# Patient Record
Sex: Male | Born: 1935 | Race: White | Hispanic: No | Marital: Married | Smoking: Former smoker
Health system: Southern US, Community
[De-identification: ages and names within clinical notes are randomized; demographics above are authoritative.]

## PROBLEM LIST (undated history)

## (undated) DIAGNOSIS — T7840XA Allergy, unspecified, initial encounter: Secondary | ICD-10-CM

## (undated) DIAGNOSIS — I5022 Chronic systolic (congestive) heart failure: Secondary | ICD-10-CM

## (undated) DIAGNOSIS — I10 Essential (primary) hypertension: Secondary | ICD-10-CM

## (undated) DIAGNOSIS — G7000 Myasthenia gravis without (acute) exacerbation: Secondary | ICD-10-CM

## (undated) DIAGNOSIS — Z860100 Personal history of colon polyps, unspecified: Secondary | ICD-10-CM

## (undated) DIAGNOSIS — Z8619 Personal history of other infectious and parasitic diseases: Secondary | ICD-10-CM

## (undated) DIAGNOSIS — F32A Depression, unspecified: Secondary | ICD-10-CM

## (undated) DIAGNOSIS — I480 Paroxysmal atrial fibrillation: Secondary | ICD-10-CM

## (undated) DIAGNOSIS — E785 Hyperlipidemia, unspecified: Secondary | ICD-10-CM

## (undated) DIAGNOSIS — I255 Ischemic cardiomyopathy: Secondary | ICD-10-CM

## (undated) DIAGNOSIS — R32 Unspecified urinary incontinence: Secondary | ICD-10-CM

## (undated) DIAGNOSIS — M1990 Unspecified osteoarthritis, unspecified site: Secondary | ICD-10-CM

## (undated) DIAGNOSIS — D649 Anemia, unspecified: Secondary | ICD-10-CM

## (undated) DIAGNOSIS — F329 Major depressive disorder, single episode, unspecified: Secondary | ICD-10-CM

## (undated) DIAGNOSIS — I2510 Atherosclerotic heart disease of native coronary artery without angina pectoris: Secondary | ICD-10-CM

## (undated) DIAGNOSIS — K219 Gastro-esophageal reflux disease without esophagitis: Secondary | ICD-10-CM

## (undated) HISTORY — DX: Personal history of other infectious and parasitic diseases: Z86.19

## (undated) HISTORY — DX: Chronic systolic (congestive) heart failure: I50.22

## (undated) HISTORY — DX: Gastro-esophageal reflux disease without esophagitis: K21.9

## (undated) HISTORY — DX: Depression, unspecified: F32.A

## (undated) HISTORY — DX: Essential (primary) hypertension: I10

## (undated) HISTORY — DX: Unspecified urinary incontinence: R32

## (undated) HISTORY — PX: CHOLECYSTECTOMY: SHX55

## (undated) HISTORY — DX: Unspecified osteoarthritis, unspecified site: M19.90

## (undated) HISTORY — DX: Personal history of colonic polyps: Z86.010

## (undated) HISTORY — DX: Myasthenia gravis without (acute) exacerbation: G70.00

## (undated) HISTORY — DX: Major depressive disorder, single episode, unspecified: F32.9

## (undated) HISTORY — PX: TONSILLECTOMY AND ADENOIDECTOMY: SUR1326

## (undated) HISTORY — DX: Personal history of colon polyps, unspecified: Z86.0100

## (undated) HISTORY — DX: Hyperlipidemia, unspecified: E78.5

## (undated) HISTORY — PX: CARDIAC CATHETERIZATION: SHX172

## (undated) HISTORY — DX: Allergy, unspecified, initial encounter: T78.40XA

## (undated) HISTORY — DX: Anemia, unspecified: D64.9

---
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
## 1988-08-07 DIAGNOSIS — J449 Chronic obstructive pulmonary disease, unspecified: Secondary | ICD-10-CM

## 1988-08-07 DIAGNOSIS — J4489 Other specified chronic obstructive pulmonary disease: Secondary | ICD-10-CM

## 1988-08-07 HISTORY — DX: Other specified chronic obstructive pulmonary disease: J44.89

## 1988-08-07 HISTORY — DX: Chronic obstructive pulmonary disease, unspecified: J44.9

## 1993-08-07 DIAGNOSIS — E785 Hyperlipidemia, unspecified: Secondary | ICD-10-CM

## 1993-08-07 HISTORY — DX: Hyperlipidemia, unspecified: E78.5

## 1995-03-03 ENCOUNTER — Encounter: Payer: Self-pay | Admitting: Family Medicine

## 1995-03-03 LAB — CONVERTED CEMR LAB: PSA: 2.1 ng/mL

## 1996-10-23 ENCOUNTER — Encounter: Payer: Self-pay | Admitting: Gastroenterology

## 1996-11-21 ENCOUNTER — Encounter: Payer: Self-pay | Admitting: Gastroenterology

## 1998-08-10 ENCOUNTER — Encounter: Payer: Self-pay | Admitting: Family Medicine

## 1998-08-10 LAB — CONVERTED CEMR LAB: PSA: 1 ng/mL

## 1999-01-07 ENCOUNTER — Inpatient Hospital Stay (HOSPITAL_COMMUNITY): Admission: EM | Admit: 1999-01-07 | Discharge: 1999-01-10 | Payer: Self-pay | Admitting: Emergency Medicine

## 1999-01-07 ENCOUNTER — Encounter: Payer: Self-pay | Admitting: Emergency Medicine

## 1999-01-10 ENCOUNTER — Encounter: Payer: Self-pay | Admitting: General Surgery

## 1999-03-16 ENCOUNTER — Ambulatory Visit (HOSPITAL_BASED_OUTPATIENT_CLINIC_OR_DEPARTMENT_OTHER): Admission: RE | Admit: 1999-03-16 | Discharge: 1999-03-16 | Payer: Self-pay | Admitting: Surgery

## 1999-08-24 ENCOUNTER — Encounter: Payer: Self-pay | Admitting: Family Medicine

## 2000-08-24 ENCOUNTER — Encounter: Payer: Self-pay | Admitting: Family Medicine

## 2000-08-24 LAB — CONVERTED CEMR LAB: PSA: 1 ng/mL

## 2001-10-25 ENCOUNTER — Encounter: Payer: Self-pay | Admitting: Family Medicine

## 2001-10-25 LAB — CONVERTED CEMR LAB: PSA: 0.9 ng/mL

## 2001-12-05 DIAGNOSIS — I10 Essential (primary) hypertension: Secondary | ICD-10-CM | POA: Insufficient documentation

## 2001-12-05 HISTORY — DX: Essential (primary) hypertension: I10

## 2003-03-02 ENCOUNTER — Encounter: Payer: Self-pay | Admitting: Emergency Medicine

## 2003-03-02 ENCOUNTER — Emergency Department (HOSPITAL_COMMUNITY): Admission: EM | Admit: 2003-03-02 | Discharge: 2003-03-02 | Payer: Self-pay | Admitting: Emergency Medicine

## 2003-03-06 ENCOUNTER — Encounter: Payer: Self-pay | Admitting: General Surgery

## 2003-03-10 ENCOUNTER — Inpatient Hospital Stay (HOSPITAL_COMMUNITY): Admission: RE | Admit: 2003-03-10 | Discharge: 2003-03-13 | Payer: Self-pay | Admitting: General Surgery

## 2003-03-10 ENCOUNTER — Encounter (INDEPENDENT_AMBULATORY_CARE_PROVIDER_SITE_OTHER): Payer: Self-pay | Admitting: Specialist

## 2004-07-26 ENCOUNTER — Ambulatory Visit: Payer: Self-pay | Admitting: Family Medicine

## 2004-07-28 ENCOUNTER — Ambulatory Visit: Payer: Self-pay | Admitting: Family Medicine

## 2005-01-24 ENCOUNTER — Ambulatory Visit: Payer: Self-pay | Admitting: Family Medicine

## 2005-04-03 ENCOUNTER — Ambulatory Visit: Payer: Self-pay | Admitting: Family Medicine

## 2006-04-27 ENCOUNTER — Ambulatory Visit: Payer: Self-pay | Admitting: Family Medicine

## 2006-05-07 ENCOUNTER — Encounter: Payer: Self-pay | Admitting: Family Medicine

## 2006-05-28 ENCOUNTER — Ambulatory Visit: Payer: Self-pay | Admitting: Family Medicine

## 2006-06-04 ENCOUNTER — Ambulatory Visit: Payer: Self-pay | Admitting: Family Medicine

## 2006-07-10 ENCOUNTER — Ambulatory Visit: Payer: Self-pay | Admitting: Family Medicine

## 2006-11-15 ENCOUNTER — Encounter: Payer: Self-pay | Admitting: Family Medicine

## 2006-11-15 DIAGNOSIS — Z8719 Personal history of other diseases of the digestive system: Secondary | ICD-10-CM

## 2006-11-15 DIAGNOSIS — N486 Induration penis plastica: Secondary | ICD-10-CM

## 2006-11-15 HISTORY — DX: Induration penis plastica: N48.6

## 2006-11-15 HISTORY — DX: Personal history of other diseases of the digestive system: Z87.19

## 2006-12-05 ENCOUNTER — Ambulatory Visit: Payer: Self-pay | Admitting: Family Medicine

## 2006-12-05 DIAGNOSIS — M25519 Pain in unspecified shoulder: Secondary | ICD-10-CM | POA: Insufficient documentation

## 2006-12-05 HISTORY — DX: Pain in unspecified shoulder: M25.519

## 2007-02-15 ENCOUNTER — Ambulatory Visit: Payer: Self-pay | Admitting: Family Medicine

## 2007-02-20 ENCOUNTER — Telehealth (INDEPENDENT_AMBULATORY_CARE_PROVIDER_SITE_OTHER): Payer: Self-pay | Admitting: *Deleted

## 2007-02-26 ENCOUNTER — Ambulatory Visit: Payer: Self-pay | Admitting: Family Medicine

## 2007-02-27 ENCOUNTER — Encounter: Payer: Self-pay | Admitting: Family Medicine

## 2007-03-06 ENCOUNTER — Ambulatory Visit: Payer: Self-pay | Admitting: Family Medicine

## 2007-03-13 ENCOUNTER — Encounter: Payer: Self-pay | Admitting: Family Medicine

## 2007-06-24 ENCOUNTER — Ambulatory Visit: Payer: Self-pay | Admitting: Family Medicine

## 2007-06-24 LAB — CONVERTED CEMR LAB
AST: 21 units/L (ref 0–37)
Albumin: 3.8 g/dL (ref 3.5–5.2)
Alkaline Phosphatase: 59 units/L (ref 39–117)
BUN: 13 mg/dL (ref 6–23)
Chloride: 103 meq/L (ref 96–112)
Cholesterol: 252 mg/dL (ref 0–200)
Creatinine, Ser: 0.8 mg/dL (ref 0.4–1.5)
Total Bilirubin: 0.7 mg/dL (ref 0.3–1.2)
VLDL: 26 mg/dL (ref 0–40)

## 2007-06-26 ENCOUNTER — Ambulatory Visit: Payer: Self-pay | Admitting: Family Medicine

## 2007-11-08 ENCOUNTER — Ambulatory Visit: Payer: Self-pay | Admitting: Family Medicine

## 2007-11-08 DIAGNOSIS — T7840XA Allergy, unspecified, initial encounter: Secondary | ICD-10-CM

## 2007-11-08 HISTORY — DX: Allergy, unspecified, initial encounter: T78.40XA

## 2008-04-06 ENCOUNTER — Telehealth: Payer: Self-pay | Admitting: Family Medicine

## 2008-10-14 ENCOUNTER — Ambulatory Visit: Payer: Self-pay | Admitting: Family Medicine

## 2008-10-14 DIAGNOSIS — L259 Unspecified contact dermatitis, unspecified cause: Secondary | ICD-10-CM

## 2008-10-14 DIAGNOSIS — K219 Gastro-esophageal reflux disease without esophagitis: Secondary | ICD-10-CM | POA: Insufficient documentation

## 2008-10-14 HISTORY — DX: Unspecified contact dermatitis, unspecified cause: L25.9

## 2008-10-14 HISTORY — DX: Gastro-esophageal reflux disease without esophagitis: K21.9

## 2009-04-10 ENCOUNTER — Observation Stay (HOSPITAL_COMMUNITY): Admission: EM | Admit: 2009-04-10 | Discharge: 2009-04-10 | Payer: Self-pay | Admitting: Emergency Medicine

## 2009-04-26 ENCOUNTER — Ambulatory Visit: Payer: Self-pay | Admitting: Family Medicine

## 2009-04-26 LAB — CONVERTED CEMR LAB
ALT: 22 units/L (ref 0–53)
Albumin: 3.5 g/dL (ref 3.5–5.2)
BUN: 15 mg/dL (ref 6–23)
Chloride: 106 meq/L (ref 96–112)
Cholesterol: 254 mg/dL — ABNORMAL HIGH (ref 0–200)
Eosinophils Relative: 4 % (ref 0.0–5.0)
Glucose, Bld: 99 mg/dL (ref 70–99)
HCT: 41.2 % (ref 39.0–52.0)
Lymphs Abs: 1.1 10*3/uL (ref 0.7–4.0)
MCV: 87.5 fL (ref 78.0–100.0)
Monocytes Absolute: 0.7 10*3/uL (ref 0.1–1.0)
Platelets: 155 10*3/uL (ref 150.0–400.0)
Potassium: 4.5 meq/L (ref 3.5–5.1)
RDW: 12.1 % (ref 11.5–14.6)
TSH: 1.5 microintl units/mL (ref 0.35–5.50)
Total Bilirubin: 0.9 mg/dL (ref 0.3–1.2)
VLDL: 26.4 mg/dL (ref 0.0–40.0)
WBC: 6.9 10*3/uL (ref 4.5–10.5)

## 2009-04-27 LAB — CONVERTED CEMR LAB: Vit D, 25-Hydroxy: 24 ng/mL — ABNORMAL LOW (ref 30–89)

## 2009-04-29 ENCOUNTER — Ambulatory Visit: Payer: Self-pay | Admitting: Family Medicine

## 2009-04-29 DIAGNOSIS — E559 Vitamin D deficiency, unspecified: Secondary | ICD-10-CM

## 2009-04-29 HISTORY — DX: Vitamin D deficiency, unspecified: E55.9

## 2009-05-13 ENCOUNTER — Ambulatory Visit: Payer: Self-pay | Admitting: Family Medicine

## 2009-05-13 LAB — CONVERTED CEMR LAB
OCCULT 1: POSITIVE
OCCULT 2: POSITIVE
OCCULT 3: NEGATIVE

## 2009-06-09 ENCOUNTER — Ambulatory Visit: Payer: Self-pay | Admitting: Gastroenterology

## 2009-06-09 ENCOUNTER — Encounter: Payer: Self-pay | Admitting: Gastroenterology

## 2009-06-16 ENCOUNTER — Encounter: Payer: Self-pay | Admitting: Gastroenterology

## 2009-06-16 ENCOUNTER — Ambulatory Visit: Payer: Self-pay | Admitting: Gastroenterology

## 2009-06-19 ENCOUNTER — Encounter: Payer: Self-pay | Admitting: Gastroenterology

## 2009-06-24 ENCOUNTER — Telehealth: Payer: Self-pay | Admitting: Family Medicine

## 2009-07-07 ENCOUNTER — Ambulatory Visit: Payer: Self-pay | Admitting: Family Medicine

## 2009-08-23 ENCOUNTER — Ambulatory Visit: Payer: Self-pay | Admitting: Family Medicine

## 2009-08-24 LAB — CONVERTED CEMR LAB: Vit D, 25-Hydroxy: 27 ng/mL — ABNORMAL LOW (ref 30–89)

## 2009-08-25 LAB — CONVERTED CEMR LAB: HDL: 42.2 mg/dL (ref >39.00)

## 2009-08-30 ENCOUNTER — Ambulatory Visit: Payer: Self-pay | Admitting: Family Medicine

## 2009-10-12 ENCOUNTER — Ambulatory Visit: Payer: Self-pay | Admitting: Family Medicine

## 2009-10-12 LAB — CONVERTED CEMR LAB
ALT: 24 units/L (ref 0–53)
AST: 27 units/L (ref 0–37)

## 2009-11-25 ENCOUNTER — Ambulatory Visit: Payer: Self-pay | Admitting: Family Medicine

## 2009-11-25 LAB — CONVERTED CEMR LAB
Cholesterol: 158 mg/dL (ref 0–200)
HDL: 40.4 mg/dL (ref >39.00)
Triglycerides: 72 mg/dL (ref 0.0–149.0)

## 2009-12-02 ENCOUNTER — Ambulatory Visit: Payer: Self-pay | Admitting: Family Medicine

## 2010-03-15 ENCOUNTER — Encounter (INDEPENDENT_AMBULATORY_CARE_PROVIDER_SITE_OTHER): Payer: Self-pay | Admitting: *Deleted

## 2010-07-06 ENCOUNTER — Ambulatory Visit: Payer: Self-pay | Admitting: Family Medicine

## 2010-09-06 NOTE — Assessment & Plan Note (Signed)
Summary: 3 MONTH FOLLOW UP/[facility]   Vital Signs:  Patient profile:   77 year old male Weight:      183.50 pounds Temp:     98.6 degrees F oral Pulse rate:   60 / minute Pulse rhythm:   regular BP sitting:   140 / 88  (left arm) Cuff size:   regular  Vitals Entered By: Sydell Axon LPN (December 02, 2009 10:32 AM) CC: 3 Month follow-up   History of Present Illness: Pt here for three month followup after increasing Vit D and starting Simva at 40mg . He feel well and has had no problems. He has gained weight lately, no exercise over the last intense winter. He has chronic anxiety. We discussed.  Problems Prior to Update: 1)  Blood in Stool 2/6 Cards  (ICD-578.1) 2)  Special Screening Malig Neoplasms Other Sites  (ICD-V76.49) 3)  Vitamin D Deficiency  (ICD-268.9) 4)  Gerd  (ICD-530.81) 5)  Eczema  (ICD-692.9) 6)  Allergy  (ICD-995.3) 7)  Special Screening Malignant Neoplasm of Prostate  (ICD-V76.44) 8)  Shoulder Pain, Left, Chronic  (ICD-719.41) 9)  Mononeuritis, Upper Limb Nec Scapular Nerve  (ICD-354.8) 10)  Rectal Bleeding, Hx of  (ICD-V12.79) 11)  Peyronie's Disease  (ICD-607.85) 12)  Anxiety  (ICD-300.00) 13)  Hypertension  (ICD-401.9) 14)  Hyperlipidemia  (ICD-272.4) 15)  COPD  (ICD-496)  Medications Prior to Update: 1)  Atenolol 25 Mg Tabs (Atenolol) .Marland Kitchen.. 1 By Mouth Every Night 2)  Nasonex 50 Mcg/act Susp (Mometasone Furoate) .Marland Kitchen.. 1 Spray Each Nostril As Needed 3)  Paxil 40 Mg Tabs (Paroxetine Hcl) .... One  Tab By Mouth Once Daily 4)  Ventolin Hfa 108 (90 Base) Mcg/act Aers (Albuterol Sulfate) .... One Spray Every 4 Hours As Needed 5)  Zyrtec Allergy 10 Mg Tabs (Cetirizine Hcl) .... Take One By Mouth Daily 6)  Protonix 40 Mg Tbec (Pantoprazole Sodium) .... Take One Tab Daily 7)  Simvastatin 40 Mg Tabs (Simvastatin) .... One Tab By Mouth At Night.  Allergies: 1)  ! Sulfa  Physical Exam  General:  Well-developed,well-nourished,in no acute distress; alert,appropriate and  cooperative throughout examination, comfortable and breathing easily through his nose. Head:  Normocephalic and atraumatic without obvious abnormalities. No apparent alopecia or balding. Mild thinning. Sinuses nontender. Eyes:  Conjunctiva clear bilaterally.  Ears:  External ear exam shows no significant lesions or deformities.  Otoscopic examination reveals clear canals, tympanic membranes are intact bilaterally without bulging, retraction, inflammation or discharge. Hearing is grossly normal bilaterally. Nose:  External nasal examination shows no deformity or inflammation. Nasal mucosa are pink and moist without lesions or exudates. Mouth:  Oral mucosa and oropharynx without lesions or exudates.  Teeth in reasonable repair. Neck:  No deformities, masses, or tenderness noted. Lungs:  Normal respiratory effort, chest expands symmetrically. Lungs are clear to auscultation, no crackles or wheezes. No wheezes heard today. Heart:  Normal rate and regular rhythm. S1 and S2 normal without gallop, murmur, click, rub or other extra sounds.   Impression & Recommendations:  Problem # 1:  HYPERLIPIDEMIA (ICD-272.4) Assessment Improved  Great improvement on the Simva...cont. His updated medication list for this problem includes:    Simvastatin 40 Mg Tabs (Simvastatin) ..... One tab by mouth at night.  Labs Reviewed: SGOT: 23 (11/25/2009)   SGPT: 27 (11/25/2009)   HDL:40.40 (11/25/2009), 42.20 (08/23/2009)  LDL:103 (11/25/2009), DEL (06/24/2007)  Chol:158 (11/25/2009), 252 (08/23/2009)  Trig:72.0 (11/25/2009), 109.0 (08/23/2009)  Problem # 2:  VITAMIN D DEFICIENCY (ICD-268.9) Assessment: Improved Should be  improved...will recheck next time. Has gone to two times a day.  Problem # 3:  ANXIETY (ICD-300.00) Assessment: Unchanged Discusssed increasing exercise and trying to avoid Ventolin to help anxiety as he gets jittery and his entire familty has had anxiety. He is on Paxil which he realizes is  helping. His updated medication list for this problem includes:    Paxil 40 Mg Tabs (Paroxetine hcl) ..... One  tab by mouth once daily  Problem # 4:  HYPERTENSION (ICD-401.9) Assessment: Improved Better but still elevatwed. Adjust next time if needed. His updated medication list for this problem includes:    Atenolol 25 Mg Tabs (Atenolol) .Marland Kitchen... 1 by mouth every night  BP today: 140/88 Prior BP: 150/90 (08/30/2009)  Labs Reviewed: K+: 4.5 (04/26/2009) Creat: : 0.8 (04/26/2009)   Chol: 158 (11/25/2009)   HDL: 40.40 (11/25/2009)   LDL: 103 (11/25/2009)   TG: 72.0 (11/25/2009)  Complete Medication List: 1)  Atenolol 25 Mg Tabs (Atenolol) .Marland Kitchen.. 1 by mouth every night 2)  Nasonex 50 Mcg/act Susp (Mometasone furoate) .Marland Kitchen.. 1 spray each nostril as needed 3)  Paxil 40 Mg Tabs (Paroxetine hcl) .... One  tab by mouth once daily 4)  Ventolin Hfa 108 (90 Base) Mcg/act Aers (Albuterol sulfate) .... One spray every 4 hours as needed 5)  Zyrtec Allergy 10 Mg Tabs (Cetirizine hcl) .... Take one by mouth daily 6)  Protonix 40 Mg Tbec (Pantoprazole sodium) .... Take one tab daily 7)  Simvastatin 40 Mg Tabs (Simvastatin) .... One tab by mouth at night.  Patient Instructions: 1)  Check Vit D next time. 2)  Start BP meds next time if BP still elevated....lose weight.  Current Allergies (reviewed today): ! SULFA

## 2010-09-06 NOTE — Assessment & Plan Note (Signed)
Summary: F/U/DLO   Vital Signs:  Patient profile:   77 year old male Height:      67 inches Weight:      182.75 pounds BMI:     28.73 Temp:     97.1 degrees F oral Pulse rate:   60 / minute Pulse rhythm:   regular BP sitting:   130 / 80  (right arm) Cuff size:   regular  Vitals Entered By: Linde Gillis CMA Duncan Dull) (July 06, 2010 9:54 AM) CC: follow-up visit   History of Present Illness: Pt here for followup. He is taking Vit D twice a day.  He has some burning in the urethral area. He eats lots of tomatoes and doesn't drink much water...knows he is probably dehydrated. We discussed hydration. He has trired to start watching his intake some...he is 1 pound lighter than last visit. He feels well with no acute complaints.  Problems Prior to Update: 1)  Blood in Stool 2/6 Cards  (ICD-578.1) 2)  Special Screening Malig Neoplasms Other Sites  (ICD-V76.49) 3)  Vitamin D Deficiency  (ICD-268.9) 4)  Gerd  (ICD-530.81) 5)  Eczema  (ICD-692.9) 6)  Allergy  (ICD-995.3) 7)  Special Screening Malignant Neoplasm of Prostate  (ICD-V76.44) 8)  Shoulder Pain, Left, Chronic  (ICD-719.41) 9)  Mononeuritis, Upper Limb Nec Scapular Nerve  (ICD-354.8) 10)  Rectal Bleeding, Hx of  (ICD-V12.79) 11)  Peyronie's Disease  (ICD-607.85) 12)  Anxiety  (ICD-300.00) 13)  Hypertension  (ICD-401.9) 14)  Hyperlipidemia  (ICD-272.4) 15)  COPD  (ICD-496)  Medications Prior to Update: 1)  Atenolol 25 Mg Tabs (Atenolol) .Marland Kitchen.. 1 By Mouth Every Night 2)  Nasonex 50 Mcg/act Susp (Mometasone Furoate) .Marland Kitchen.. 1 Spray Each Nostril As Needed 3)  Paxil 40 Mg Tabs (Paroxetine Hcl) .... One  Tab By Mouth Once Daily 4)  Ventolin Hfa 108 (90 Base) Mcg/act Aers (Albuterol Sulfate) .... One Spray Every 4 Hours As Needed 5)  Zyrtec Allergy 10 Mg Tabs (Cetirizine Hcl) .... Take One By Mouth Daily 6)  Protonix 40 Mg Tbec (Pantoprazole Sodium) .... Take One Tab Daily 7)  Simvastatin 40 Mg Tabs (Simvastatin) .... One Tab By  Mouth At Night.  Current Medications (verified): 1)  Atenolol 25 Mg Tabs (Atenolol) .Marland Kitchen.. 1 By Mouth Every Night 2)  Nasonex 50 Mcg/act Susp (Mometasone Furoate) .Marland Kitchen.. 1 Spray Each Nostril As Needed 3)  Paxil 40 Mg Tabs (Paroxetine Hcl) .... One  Tab By Mouth Once Daily 4)  Ventolin Hfa 108 (90 Base) Mcg/act Aers (Albuterol Sulfate) .... One Spray Every 4 Hours As Needed 5)  Zyrtec Allergy 10 Mg Tabs (Cetirizine Hcl) .... Take One By Mouth Daily 6)  Protonix 40 Mg Tbec (Pantoprazole Sodium) .... Take One Tab Daily 7)  Simvastatin 40 Mg Tabs (Simvastatin) .... One Tab By Mouth At Night. 8)  Fish Oil 1200 Mg Caps (Omega-3 Fatty Acids) .... Take One Tablet By Mouth Daily 9)  Vitamin D 2000 Unit Caps (Cholecalciferol) .... Take One Tablet By Mouth Daily  Allergies: 1)  ! Sulfa  Physical Exam  General:  Well-developed,well-nourished,in no acute distress; alert,appropriate and cooperative throughout examination, comfortable and breathing easily through his nose. Head:  Normocephalic and atraumatic without obvious abnormalities. No apparent alopecia or balding. Mild thinning. Sinuses nontender. Eyes:  Conjunctiva clear bilaterally.  Ears:  External ear exam shows no significant lesions or deformities.  Otoscopic examination reveals clear canals, tympanic membranes are intact bilaterally without bulging, retraction, inflammation or discharge. Hearing is grossly normal bilaterally.  Nose:  External nasal examination shows no deformity or inflammation. Nasal mucosa are pink and moist without lesions or exudates. Mouth:  Oral mucosa and oropharynx without lesions or exudates.  Teeth in reasonable repair. Neck:  No deformities, masses, or tenderness noted. Chest Wall:  No deformities, masses, tenderness or gynecomastia noted. Lungs:  Normal respiratory effort, chest expands symmetrically. Lungs are clear to auscultation, no crackles or wheezes. No wheezes heard today. Heart:  Normal rate and regular  rhythm. S1 and S2 normal without gallop, murmur, click, rub or other extra sounds.   Impression & Recommendations:  Problem # 1:  VITAMIN D DEFICIENCY (ICD-268.9) Assessment Unchanged Is taking Vit D OTC two times a day. Will recheck at Comp Exam...to be scheduled for next visit.  Problem # 2:  HYPERTENSION (ICD-401.9) Assessment: Improved Acceptable today altho weight loss will help improve even more. His updated medication list for this problem includes:    Atenolol 25 Mg Tabs (Atenolol) .Marland Kitchen... 1 by mouth every night  BP today: 130/80 Prior BP: 140/88 (12/02/2009)  Labs Reviewed: K+: 4.5 (04/26/2009) Creat: : 0.8 (04/26/2009)   Chol: 158 (11/25/2009)   HDL: 40.40 (11/25/2009)   LDL: 103 (11/25/2009)   TG: 72.0 (11/25/2009)  Complete Medication List: 1)  Atenolol 25 Mg Tabs (Atenolol) .Marland Kitchen.. 1 by mouth every night 2)  Nasonex 50 Mcg/act Susp (Mometasone furoate) .Marland Kitchen.. 1 spray each nostril as needed 3)  Paxil 40 Mg Tabs (Paroxetine hcl) .... One  tab by mouth once daily 4)  Ventolin Hfa 108 (90 Base) Mcg/act Aers (Albuterol sulfate) .... One spray every 4 hours as needed 5)  Zyrtec Allergy 10 Mg Tabs (Cetirizine hcl) .... Take one by mouth daily 6)  Protonix 40 Mg Tbec (Pantoprazole sodium) .... Take one tab daily 7)  Simvastatin 40 Mg Tabs (Simvastatin) .... One tab by mouth at night. 8)  Fish Oil 1200 Mg Caps (Omega-3 fatty acids) .... Take one tablet by mouth daily 9)  Vitamin D 2000 Unit Caps (Cholecalciferol) .... Take one tablet by mouth daily  Patient Instructions: 1)  RTC for Comp Exam, labs prior.    Orders Added: 1)  Est. Patient Level III [40102]    Current Allergies (reviewed today): ! SULFA

## 2010-09-06 NOTE — Letter (Signed)
Summary: Nadara Eaton letter  [place] at [facility]  [address] [place], [place] [address]   Phone: [phone]  Fax: [phone]       03/15/2010 MRN: [id number]  Norah Harwick [address] [place], [place]  [address]  Dear Mr. NAJI,  [facility], and [facility] announce the retirement of Arta Silence, M.D., from full-time practice at the [facility] office effective February 03, 2010 and his plans of returning part-time.  It is important to Dr. Hetty Ely and to our practice that you understand that [facility] has seven physicians in our office for your health care needs.  We will continue to offer the same exceptional care that you have today.    Dr. Hetty Ely has spoken to many of you about his plans for retirement and returning part-time in the fall.   We will continue to work with you through the transition to schedule appointments for you in the office and meet the high standards that [facility] is committed to.   Again, it is with great pleasure that we share the news that Dr. Hetty Ely will return to [facility] at [facility] in October of 2011 with a reduced schedule.    If you have any questions, or would like to request an appointment with one of our physicians, please call us at [phone] and press the option for Scheduling an appointment.  We take pleasure in providing you with excellent patient care and look forward to seeing you at your next office visit.  Our [facility] Physicians are:  Tillman Abide, M.D. Laurita Quint, M.D. Roxy Manns, M.D. Kerby Nora, M.D. Hannah Beat, M.D. Ruthe Mannan, M.D. We proudly welcomed Raechel Ache, M.D. and Eustaquio Boyden, M.D. to the practice in July/August 2011.  Sincerely,   [facility]

## 2010-09-06 NOTE — Assessment & Plan Note (Signed)
Summary: 4 month follow up/rbh   Vital Signs:  Patient profile:   77 year old male Weight:      185 pounds Temp:     97.9 degrees F oral Pulse rate:   64 / minute Pulse rhythm:   regular BP sitting:   150 / 90  (left arm) Cuff size:   regular  Vitals Entered By: Sydell Axon LPN (August 30, 2009 9:55 AM) CC: 4 Month follow-up   History of Present Illness: Pt here for recheck of chol and Vit D. He has been a couch potatoe wth the cold weather. He is unaware that he gained 8 pounds since last visit but is not surprised.  He is taking 600 Iu of Vit D two times a day. He feels sluggish.  Problems Prior to Update: 1)  Blood in Stool 2/6 Cards  (ICD-578.1) 2)  Special Screening Malig Neoplasms Other Sites  (ICD-V76.49) 3)  Vitamin D Deficiency  (ICD-268.9) 4)  Gerd  (ICD-530.81) 5)  Eczema  (ICD-692.9) 6)  Allergy  (ICD-995.3) 7)  Special Screening Malignant Neoplasm of Prostate  (ICD-V76.44) 8)  Shoulder Pain, Left, Chronic  (ICD-719.41) 9)  Mononeuritis, Upper Limb Nec Scapular Nerve  (ICD-354.8) 10)  Rectal Bleeding, Hx of  (ICD-V12.79) 11)  Peyronie's Disease  (ICD-607.85) 12)  Anxiety  (ICD-300.00) 13)  Hypertension  (ICD-401.9) 14)  Hyperlipidemia  (ICD-272.4) 15)  COPD  (ICD-496)  Medications Prior to Update: 1)  Atenolol 25 Mg Tabs (Atenolol) .Marland Kitchen.. 1 By Mouth Every Night 2)  Nasonex 50 Mcg/act Susp (Mometasone Furoate) .Marland Kitchen.. 1 Spray Each Nostril Prn 3)  Paxil 40 Mg Tabs (Paroxetine Hcl) .... One  Tab By Mouth Once Daily 4)  Ventolin Hfa 108 (90 Base) Mcg/act Aers (Albuterol Sulfate) .... One Spray Every 4 Hours As Needed 5)  Zyrtec Allergy 10 Mg Tabs (Cetirizine Hcl) .... Take One By Mouth Daily 6)  Ranitidine Hcl 150 Mg Caps (Ranitidine Hcl) .... One Cap By Mouth Two Times A Day 7)  Protonix 40 Mg Tbec (Pantoprazole Sodium) .... Take One Tab Daily  Allergies: 1)  ! Sulfa  Physical Exam  General:  Well-developed,well-nourished,in no acute distress;  alert,appropriate and cooperative throughout examination, comfortable and breathing easily through his nose. Head:  Normocephalic and atraumatic without obvious abnormalities. No apparent alopecia or balding. Mild thinning. Sinuses nontender. Eyes:  Conjunctiva clear bilaterally.  Ears:  External ear exam shows no significant lesions or deformities.  Otoscopic examination reveals clear canals, tympanic membranes are intact bilaterally without bulging, retraction, inflammation or discharge. Hearing is grossly normal bilaterally. Nose:  External nasal examination shows no deformity or inflammation. Nasal mucosa are pink and moist without lesions or exudates. Mouth:  Oral mucosa and oropharynx without lesions or exudates.  Teeth in reasonable repair.   Impression & Recommendations:  Problem # 1:  VITAMIN D DEFICIENCY (ICD-268.9) Assessment Improved But still low. Increase to 1000 Iu two times a day   Problem # 2:  HYPERLIPIDEMIA (ICD-272.4) Assessment: Unchanged Slightly worse. Is willing to try meds. He suggests Simvastatin. Will try 40mg . Should get to acceptable level. His updated medication list for this problem includes:    Simvastatin 40 Mg Tabs (Simvastatin) ..... One tab by mouth at night.  Labs Reviewed: SGOT: 19 (04/26/2009)   SGPT: 22 (04/26/2009)   HDL:42.20 (08/23/2009), 40.70 (04/26/2009)  LDL:DEL (06/24/2007)  Chol:252 (08/23/2009), 254 (04/26/2009)  Trig:109.0 (08/23/2009), 132.0 (04/26/2009)  Problem # 3:  HYPERTENSION (ICD-401.9) Assessment: Deteriorated Presumavbly incr due to weight gain. Will try  to decrease via diet and exercise. Adjust meds next time as needed. His updated medication list for this problem includes:    Atenolol 25 Mg Tabs (Atenolol) .Marland Kitchen... 1 by mouth every night  BP today: 150/90 Prior BP: 124/72 (06/09/2009)  Labs Reviewed: K+: 4.5 (04/26/2009) Creat: : 0.8 (04/26/2009)   Chol: 252 (08/23/2009)   HDL: 42.20 (08/23/2009)   LDL: DEL (06/24/2007)    TG: 109.0 (08/23/2009)  Complete Medication List: 1)  Atenolol 25 Mg Tabs (Atenolol) .Marland Kitchen.. 1 by mouth every night 2)  Nasonex 50 Mcg/act Susp (Mometasone furoate) .Marland Kitchen.. 1 spray each nostril as needed 3)  Paxil 40 Mg Tabs (Paroxetine hcl) .... One  tab by mouth once daily 4)  Ventolin Hfa 108 (90 Base) Mcg/act Aers (Albuterol sulfate) .... One spray every 4 hours as needed 5)  Zyrtec Allergy 10 Mg Tabs (Cetirizine hcl) .... Take one by mouth daily 6)  Protonix 40 Mg Tbec (Pantoprazole sodium) .... Take one tab daily 7)  Simvastatin 40 Mg Tabs (Simvastatin) .... One tab by mouth at night.  Patient Instructions: 1)  SGOT, SGPT 272. 4   6 WEEKS 2)  See me in 3 mos, SGOT, SGPT, CHOL PROFILE  272. 4    prior 3)  Lose weight via diet and exercise and recheck BP next visit. Prescriptions: SIMVASTATIN 40 MG TABS (SIMVASTATIN) one tab by mouth at night.  #30 x 12   Entered and Authorized by:   Shaune Leeks MD   Signed by:   Shaune Leeks MD on 08/30/2009   Method used:   Electronically to        [facility] [id number] * (retail)       [address]       [place], [place]  [address]       Ph: [phone] or [phone]       Fax: [phone]   RxID:   [id number]   Current Allergies (reviewed today): ! SULFA

## 2010-10-10 ENCOUNTER — Other Ambulatory Visit: Payer: Self-pay | Admitting: Family Medicine

## 2010-10-10 ENCOUNTER — Encounter: Payer: Self-pay | Admitting: Family Medicine

## 2010-10-10 ENCOUNTER — Encounter (INDEPENDENT_AMBULATORY_CARE_PROVIDER_SITE_OTHER): Payer: Self-pay | Admitting: *Deleted

## 2010-10-10 ENCOUNTER — Other Ambulatory Visit (INDEPENDENT_AMBULATORY_CARE_PROVIDER_SITE_OTHER): Payer: Medicare Other

## 2010-10-10 DIAGNOSIS — I10 Essential (primary) hypertension: Secondary | ICD-10-CM

## 2010-10-10 DIAGNOSIS — E559 Vitamin D deficiency, unspecified: Secondary | ICD-10-CM

## 2010-10-10 DIAGNOSIS — E785 Hyperlipidemia, unspecified: Secondary | ICD-10-CM

## 2010-10-10 DIAGNOSIS — Z125 Encounter for screening for malignant neoplasm of prostate: Secondary | ICD-10-CM

## 2010-10-10 LAB — HEPATIC FUNCTION PANEL
Alkaline Phosphatase: 50 U/L (ref 39–117)
Bilirubin, Direct: 0.1 mg/dL (ref 0.0–0.3)
Total Bilirubin: 0.4 mg/dL (ref 0.3–1.2)
Total Protein: 7.1 g/dL (ref 6.0–8.3)

## 2010-10-10 LAB — CBC WITH DIFFERENTIAL/PLATELET
Basophils Relative: 0.6 % (ref 0.0–3.0)
Eosinophils Absolute: 0.2 10*3/uL (ref 0.0–0.7)
Eosinophils Relative: 2.3 % (ref 0.0–5.0)
Lymphocytes Relative: 19.2 % (ref 12.0–46.0)
Monocytes Absolute: 0.6 10*3/uL (ref 0.1–1.0)
Neutrophils Relative %: 68.6 % (ref 43.0–77.0)
Platelets: 157 10*3/uL (ref 150.0–400.0)
RBC: 4.36 Mil/uL (ref 4.22–5.81)
WBC: 6.6 10*3/uL (ref 4.5–10.5)

## 2010-10-10 LAB — BASIC METABOLIC PANEL
BUN: 14 mg/dL (ref 6–23)
Calcium: 8.9 mg/dL (ref 8.4–10.5)
Creatinine, Ser: 0.7 mg/dL (ref 0.4–1.5)

## 2010-10-10 LAB — LIPID PANEL
Cholesterol: 169 mg/dL (ref 0–200)
HDL: 45.7 mg/dL (ref >39.00)
LDL Cholesterol: 105 mg/dL — ABNORMAL HIGH (ref 0–99)
Total CHOL/HDL Ratio: 4
Triglycerides: 93 mg/dL (ref 0.0–149.0)
VLDL: 18.6 mg/dL (ref 0.0–40.0)

## 2010-10-10 LAB — PSA: PSA: 0.76 ng/mL (ref 0.10–4.00)

## 2010-10-11 LAB — CONVERTED CEMR LAB: Vit D, 25-Hydroxy: 40 ng/mL (ref 30–89)

## 2010-10-13 ENCOUNTER — Encounter: Payer: Self-pay | Admitting: Family Medicine

## 2010-10-13 ENCOUNTER — Encounter (INDEPENDENT_AMBULATORY_CARE_PROVIDER_SITE_OTHER): Payer: BC Managed Care – HMO | Admitting: Family Medicine

## 2010-10-13 DIAGNOSIS — Z23 Encounter for immunization: Secondary | ICD-10-CM

## 2010-10-13 DIAGNOSIS — Z0100 Encounter for examination of eyes and vision without abnormal findings: Secondary | ICD-10-CM

## 2010-10-13 DIAGNOSIS — Z0110 Encounter for examination of ears and hearing without abnormal findings: Secondary | ICD-10-CM

## 2010-10-13 DIAGNOSIS — Z0000 Encounter for general adult medical examination without abnormal findings: Secondary | ICD-10-CM

## 2010-10-18 NOTE — Assessment & Plan Note (Signed)
Summary: MEDICARE WELLNESS CPX/RBH   Vital Signs:  Patient profile:   77 year old male Weight:      186.75 pounds Temp:     98.0 degrees F oral Pulse rate:   64 / minute Pulse rhythm:   regular BP sitting:   140 / 78  (left arm) Cuff size:   large  Vitals Entered By: Sydell Axon LPN (October 12, 452 3:25 PM) CC: 30 minute checkup, had a colonoscopy 11/10  Vision Screening:Left eye with correction: 20 / 30 Right eye with correction: 20 / 40 Both eyes with correction: 20 / 30        Vision Entered By: Sydell Axon LPN (October 13, 979 3:26 PM) 25db HL: Left  500 hz: 25db 1000 hz: 25db 2000 hz: 25db 4000 hz: No Response Right  500 hz: 25db 1000 hz: 25db 2000 hz: 25db 4000 hz: No Response    History of Present Illness: Pt here for Comp Exam. He feels well with no complaints except his weight is up some. He has lost before. He admits to more sweets than previously.   Preventive Screening-Counseling & Management  Alcohol-Tobacco     Alcohol drinks/day: <1     Alcohol type: beer     Smoking Status: quit     Packs/Day: 08/1998 44pyh     Year Quit: 1970     Pack years: 40     Passive Smoke Exposure: no  Caffeine-Diet-Exercise     Caffeine use/day: 1     Does Patient Exercise: yes     Type of exercise: works around the house     Times/week: 6  Problems Prior to Update: 1)  Health Maintenance Exam  (ICD-V70.0) 2)  Blood in Stool 2/6 Cards  (ICD-578.1) 3)  Special Screening Malig Neoplasms Other Sites  (ICD-V76.49) 4)  Vitamin D Deficiency  (ICD-268.9) 5)  Gerd  (ICD-530.81) 6)  Eczema  (ICD-692.9) 7)  Allergy  (ICD-995.3) 8)  Special Screening Malignant Neoplasm of Prostate  (ICD-V76.44) 9)  Shoulder Pain, Left, Chronic  (ICD-719.41) 10)  Mononeuritis, Upper Limb Nec Scapular Nerve  (ICD-354.8) 11)  Rectal Bleeding, Hx of  (ICD-V12.79) 12)  Peyronie's Disease  (ICD-607.85) 13)  Anxiety  (ICD-300.00) 14)  Hypertension  (ICD-401.9) 15)  Hyperlipidemia   (ICD-272.4) 16)  COPD  (ICD-496)  Medications Prior to Update: 1)  Atenolol 25 Mg Tabs (Atenolol) .Marland Kitchen.. 1 By Mouth Every Night 2)  Nasonex 50 Mcg/act Susp (Mometasone Furoate) .Marland Kitchen.. 1 Spray Each Nostril As Needed 3)  Paxil 40 Mg Tabs (Paroxetine Hcl) .... One  Tab By Mouth Once Daily 4)  Ventolin Hfa 108 (90 Base) Mcg/act Aers (Albuterol Sulfate) .... One Spray Every 4 Hours As Needed 5)  Zyrtec Allergy 10 Mg Tabs (Cetirizine Hcl) .... Take One By Mouth Daily 6)  Protonix 40 Mg Tbec (Pantoprazole Sodium) .... Take One Tab Daily 7)  Simvastatin 40 Mg Tabs (Simvastatin) .... One Tab By Mouth At Night. 8)  Fish Oil 1200 Mg Caps (Omega-3 Fatty Acids) .... Take One Tablet By Mouth Daily 9)  Vitamin D 2000 Unit Caps (Cholecalciferol) .... Take One Tablet By Mouth Daily  Current Medications (verified): 1)  Atenolol 25 Mg Tabs (Atenolol) .Marland Kitchen.. 1 By Mouth Every Night 2)  Nasonex 50 Mcg/act Susp (Mometasone Furoate) .Marland Kitchen.. 1 Spray Each Nostril As Needed 3)  Paxil 40 Mg Tabs (Paroxetine Hcl) .... One  Tab By Mouth Once Daily 4)  Ventolin Hfa 108 (90 Base) Mcg/act Aers (Albuterol Sulfate) .Marland KitchenMarland KitchenMarland Kitchen  One Spray Every 4 Hours As Needed 5)  Zyrtec Allergy 10 Mg Tabs (Cetirizine Hcl) .... Take One By Mouth Daily 6)  Protonix 40 Mg Tbec (Pantoprazole Sodium) .... Take One Tab Daily 7)  Simvastatin 40 Mg Tabs (Simvastatin) .... One Tab By Mouth At Night. 8)  Fish Oil 1200 Mg Caps (Omega-3 Fatty Acids) .... Take One Tablet By Mouth Daily 9)  Vitamin D 2000 Unit Caps (Cholecalciferol) .... Take One Tablet By Mouth Daily  Allergies: 1)  ! Sulfa  Past History:  Past Medical History: Last updated: 06/08/2009 Cholelithiasis: 1990 COPD1990 Hyperlipidemia: PRE 1996 Hypertension: 12/2001 Anxiety: 16 YOA(SUPPORT GROUP) internal Hemorrhoids 1998  Past Surgical History: Last updated: 06/21/2009 COLONOSCOPY NML EXCEPT INT. HEMMS. 11/21/1996 GALLSTONES:02/05/2003 CHOLECYSTECTOMY 03/13/2003 ([facility])  EGD DILATION  OF STRICTURE AT [facility] (DR KAPLAN) 06/09/2009 COLONOSCOPY POLYP X 2 B9  INT HEMMS   PROCTITIS (DR KAPLAN) 06/16/2009     10 YRS  Family History: Last updated: 10/13/2010 Father dec 90 CHF Mother dec 87 Natural Brother A 25 Prostatitis Brother dec 82 Post op of Aneurysm Brother dec 89 Natural causes   Dementia Brother dec 73 Lung Ca (smoker) Brother dec 62 ETOHic Cirrhosis Sister A  Sister A Arthritis Sister dec 53 Melanoma  Social History: Last updated: 12/05/2006 Occupation:Maintenance  Retired Married  lives w/ wife   3 children Former Smoker quit 1970 40 pyh Alcohol use-yes Drug use-no  Risk Factors: Alcohol Use: <1 (10/13/2010) Caffeine Use: 1 (10/13/2010) Exercise: yes (10/13/2010)  Risk Factors: Smoking Status: quit (10/13/2010) Packs/Day: 08/1998 44pyh (10/13/2010) Passive Smoke Exposure: no (10/13/2010)  Family History: Father dec 90 CHF Mother dec 87 Natural Brother A 33 Prostatitis Brother dec 82 Post op of Aneurysm Brother dec 89 Natural causes   Dementia Brother dec 73 Lung Ca (smoker) Brother dec 62 ETOHic Cirrhosis Sister A  Sister A Arthritis Sister dec 53 Melanoma  Review of Systems General:  Complains of fatigue and weakness; denies chills, fever, sweats, and weight loss; poss from Paxil which helps his anxiety and sleeping. Eyes:  Denies blurring, discharge, and eye pain. ENT:  Denies decreased hearing, earache, and ringing in ears. CV:  Complains of fatigue and shortness of breath with exertion; denies chest pain or discomfort, fainting, palpitations, swelling of feet, and swelling of hands; l ankle swells at times, prettyu much since spraining it years ago.Marland Kitchen Resp:  Complains of shortness of breath and wheezing; denies cough; h/o COPD. GI:  Denies abdominal pain, bloody stools, change in bowel habits, constipation, dark tarry stools, diarrhea, indigestion, loss of appetite, nausea, vomiting, vomiting blood, and yellowish skin color; noithing  recently. GU:  Complains of nocturia and urinary frequency; denies discharge, dysuria, and incontinence; mild . MS:  Complains of joint pain; denies low back pain, muscle aches, cramps, and stiffness; shoulder bilat. Derm:  Denies dryness, itching, and rash; has AKs.. Neuro:  Denies numbness, poor balance, tingling, and tremors.  Physical Exam  General:  Well-developed,well-nourished,in no acute distress; alert,appropriate and cooperative throughout examination, comfortable and breathing easily through his nose. Head:  Normocephalic and atraumatic without obvious abnormalities. No apparent alopecia or balding. Mild thinning. Sinuses nontender. Eyes:  Conjunctiva clear bilaterally.  Ears:  External ear exam shows no significant lesions or deformities.  Otoscopic examination reveals clear canals, tympanic membranes are intact bilaterally without bulging, retraction, inflammation or discharge. Hearing is grossly normal bilaterally. Nose:  External nasal examination shows no deformity or inflammation. Nasal mucosa are pink and moist without lesions or exudates. Mouth:  Oral  mucosa and oropharynx without lesions or exudates.  Teeth in reasonable repair. Neck:  No deformities, masses, or tenderness noted. Chest Wall:  No deformities, masses, tenderness or gynecomastia noted. Breasts:  No masses or gynecomastia noted Lungs:  Normal respiratory effort, chest expands symmetrically. Lungs are clear to auscultation, no crackles or wheezes. No wheezes heard today. Heart:  Normal rate and regular rhythm. S1 and S2 normal without gallop, murmur, click, rub or other extra sounds. Abdomen:  Bowel sounds positive,abdomen soft and non-tender without masses, organomegaly or hernias noted. No pulse above the umbilicus. Rectal:  No external abnormalities noted. Normal sphincter tone. No rectal masses or tenderness. G neg. Genitalia:  Testes bilaterally descended without nodularity, tenderness or masses. No scrotal  masses or lesions. No penis lesions or urethral discharge. Prostate:  Prostate gland firm and smooth, no enlargement, nodularity, tenderness, mass, asymmetry or induration. 20-30gms. Msk:  No deformity or scoliosis noted of thoracic or lumbar spine.   Pulses:  R and L carotid,radial,femoral,dorsalis pedis and posterior tibial pulses are full and equal bilaterally Extremities:  No clubbing, cyanosis, edema, or deformity noted with normal full range of motion of all joints except shoulders slightly decreased..   Neurologic:  No cranial nerve deficits noted. Station and gait are normal. Sensory, motor and coordinative functions appear intact. Skin:  Intact without suspicious lesions or rashes Cervical Nodes:  No lymphadenopathy noted Inguinal Nodes:  No significant adenopathy Psych:  Cognition and judgment appear intact. Alert and cooperative with normal attention span and concentration. No apparent delusions, illusions, hallucinations   Impression & Recommendations:  Problem # 1:  HEALTH MAINTENANCE EXAM (ICD-V70.0) I have personally reviewed the Medicare Annual Wellness questionnaire and have noted 1.   The patient's medical and social history 2.   Their use of alcohol, tobacco or illicit drugs 3.   Their current medications and supplements 4.   The patient's functional ability including ADL's, fall risks, home safety risks and hearing or visual             impairment. Vision and hearing both off slightly. 5.   Diet and physical activities 6.   Evidence for depression or mood disorders  Problem # 2:  BLOOD IN STOOL 2/6 CARDS (ICD-578.1) Assessment: Improved None seen in long time, more than one year.  Problem # 3:  VITAMIN D DEFICIENCY (ICD-268.9) Assessment: Improved Therapeutic. Cont curr dose.  Problem # 4:  GERD (ICD-530.81) Assessment: Unchanged  Stable. His updated medication list for this problem includes:    Protonix 40 Mg Tbec (Pantoprazole sodium) .Marland Kitchen... Take one tab  daily  Diagnostics Reviewed:  EGD: DONE (06/09/2009) Discussed lifestyle modifications, diet, antacids/medications, and preventive measures. Handout provided.   Problem # 5:  SPECIAL SCREENING MALIGNANT NEOPLASM OF PROSTATE (ICD-V76.44) Assessment: Unchanged Stable exam amnd PSA. Will stop doing PSAs.  Problem # 6:  ANXIETY (ICD-300.00) Assessment: Unchanged Good control. Cont Paxil. His updated medication list for this problem includes:    Paxil 40 Mg Tabs (Paroxetine hcl) ..... One  tab by mouth once daily  Problem # 7:  HYPERTENSION (ICD-401.9) Assessment: Unchanged Promises he will lose weight so will follow BP. His updated medication list for this problem includes:    Atenolol 25 Mg Tabs (Atenolol) .Marland Kitchen... 1 by mouth every night  BP today: 140/78 Prior BP: 130/80 (07/06/2010)  Labs Reviewed: K+: 4.5 (10/10/2010) Creat: : 0.7 (10/10/2010)   Chol: 169 (10/10/2010)   HDL: 45.70 (10/10/2010)   LDL: 105 (10/10/2010)   TG: 93.0 (10/10/2010)  Problem # 8:  HYPERLIPIDEMIA (ICD-272.4) Assessment: Unchanged Adequate. Try to restrict fatty foods. His updated medication list for this problem includes:    Simvastatin 40 Mg Tabs (Simvastatin) ..... One tab by mouth at night.  Labs Reviewed: SGOT: 25 (10/10/2010)   SGPT: 24 (10/10/2010)   HDL:45.70 (10/10/2010), 40.40 (11/25/2009)  LDL:105 (10/10/2010), 103 (11/25/2009)  Chol:169 (10/10/2010), 158 (11/25/2009)  Trig:93.0 (10/10/2010), 72.0 (11/25/2009)  Problem # 9:  COPD (ICD-496) Seems stable but brittle. Will follow. His updated medication list for this problem includes:    Ventolin Hfa 108 (90 Base) Mcg/act Aers (Albuterol sulfate) ..... One spray every 4 hours as needed  Vaccines Reviewed: Pneumovax: Pneumovax (08/10/1998)   Flu Vax: Fluvax 3+ (07/07/2009)  Complete Medication List: 1)  Atenolol 25 Mg Tabs (Atenolol) .Marland Kitchen.. 1 by mouth every night 2)  Nasonex 50 Mcg/act Susp (Mometasone furoate) .Marland Kitchen.. 1 spray each nostril as  needed 3)  Paxil 40 Mg Tabs (Paroxetine hcl) .... One  tab by mouth once daily 4)  Ventolin Hfa 108 (90 Base) Mcg/act Aers (Albuterol sulfate) .... One spray every 4 hours as needed 5)  Zyrtec Allergy 10 Mg Tabs (Cetirizine hcl) .... Take one by mouth daily 6)  Protonix 40 Mg Tbec (Pantoprazole sodium) .... Take one tab daily 7)  Simvastatin 40 Mg Tabs (Simvastatin) .... One tab by mouth at night. 8)  Fish Oil 1200 Mg Caps (Omega-3 fatty acids) .... Take one tablet by mouth daily 9)  Vitamin D 2000 Unit Caps (Cholecalciferol) .... Take one tablet by mouth daily  Other Orders: Audiometry [phone]) Vision Screening (11914) Tdap => 29yrs IM (78295) Admin 1st Vaccine (62130)  Patient Instructions: 1)  Tdap today, he has young grandchildren he is around. 2)  Discussed Zostavax. He will call.   Orders Added: 1)  Audiometry [92552] 2)  Vision Screening [99173] 3)  Tdap => 73yrs IM [90715] 4)  Admin 1st Vaccine [90471] 5)  Est. Patient 65& > [86578]   Immunizations Administered:  Tetanus Vaccine:    Vaccine Type: Tdap    Site: left deltoid    Mfr: GlaxoSmithKline    Dose: 0.5 ml    Route: IM    Given by: Sydell Axon LPN    Exp. Date: 05/26/2012    Lot #: [id number]    VIS given: 06/24/08 version given October 13, 2010.   Immunizations Administered:  Tetanus Vaccine:    Vaccine Type: Tdap    Site: left deltoid    Mfr: GlaxoSmithKline    Dose: 0.5 ml    Route: IM    Given by: Sydell Axon LPN    Exp. Date: 05/26/2012    Lot #: [id number]    VIS given: 06/24/08 version given October 13, 2010.  Current Allergies (reviewed today): ! SULFA

## 2010-10-18 NOTE — Letter (Signed)
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

## 2010-11-11 LAB — POCT I-STAT 3, ART BLOOD GAS (G3+)
Bicarbonate: 25.9 mEq/L — ABNORMAL HIGH (ref 20.0–24.0)
O2 Saturation: 94 %
Patient temperature: 98.6
TCO2: 27 mmol/L (ref 0–100)
pH, Arterial: 7.376 (ref 7.350–7.450)

## 2010-11-11 LAB — CBC
HCT: 38.8 % — ABNORMAL LOW (ref 39.0–52.0)
Hemoglobin: 13.2 g/dL (ref 13.0–17.0)
MCV: 88.2 fL (ref 78.0–100.0)
RBC: 4.4 MIL/uL (ref 4.22–5.81)
WBC: 9.8 10*3/uL (ref 4.0–10.5)

## 2010-11-11 LAB — DIFFERENTIAL
Eosinophils Absolute: 0.4 10*3/uL (ref 0.0–0.7)
Eosinophils Relative: 4 % (ref 0–5)
Lymphocytes Relative: 12 % (ref 12–46)
Lymphs Abs: 1.2 10*3/uL (ref 0.7–4.0)
Monocytes Absolute: 0.9 10*3/uL (ref 0.1–1.0)
Monocytes Relative: 9 % (ref 3–12)

## 2010-11-11 LAB — BASIC METABOLIC PANEL
BUN: 12 mg/dL (ref 6–23)
Chloride: 109 mEq/L (ref 96–112)
GFR calc non Af Amer: >60 mL/min (ref >60)
Potassium: 4 mEq/L (ref 3.5–5.1)
Sodium: 140 mEq/L (ref 135–145)

## 2010-12-23 NOTE — Op Note (Signed)
NAME:  Raymond Rios, Raymond Rios                                ACCOUNT NO.:  [id number]   MEDICAL RECORD NO.:  [id number]                   PATIENT TYPE:  OIB   LOCATION:  5732                                 FACILITY:  [facility]   PHYSICIAN:  Gabrielle Dare. Janee Morn, M.D.             DATE OF BIRTH:  [birth year]   DATE OF PROCEDURE:  03/10/2003  DATE OF DISCHARGE:                                 OPERATIVE REPORT   PREOPERATIVE DIAGNOSIS:  Symptomatic cholelithiasis.   POSTOPERATIVE DIAGNOSES:  1. Acute cholecystitis.  2. Empyema of the gallbladder.   INDICATIONS FOR PROCEDURE:  The patient is a 77 year old white male who has  a known history of gallstones for about 20 years, who presented to the  emergency department approximately one week ago, diagnosed with an episode  of biliary colic, with no evidence of acute cholecystitis at that time.  I  evaluated him in the office, and he presents for an elective  cholecystectomy.  He claims to have been having no pain most of the time,  but having a little bit of nausea.  He presents for an elective  cholecystectomy.   DESCRIPTION OF PROCEDURE:  An informed consent was obtained.  The patient  received intravenous antibiotics and is brought to the operating room.  General anesthesia was administered.  His abdomen was prepped and draped in  a sterile fashion.  A curvilinear infraumbilical incision was made.  The  subcutaneous tissues were dissected down to the anterior fascia which was  divided sharply.  The peritoneal cavity was then entered under direct vision  without difficulty.  The Hasson trocar was placed and secured with a #0  Vicryl pursestring suture which had been placed around the fascial opening.  Subsequently the abdomen was insufflated with carbon dioxide.  First under  direct vision the 11 mm epigastric and two 5 mm lateral ports were placed.  On exploration there was a very large number of adhesions from the liver up  to the anterior abdominal  wall, and the omentum was plastered up around an  extremely inflamed gallbladder.  The gallbladder was not initially visible.  We began dissecting the omentum off of the gallbladder.  This progressed  rather slowly.  The dome of the gallbladder was uncovered and was extremely  friable, and retracting it a small puncture was made into it, and a large  volume of pus flowed freely from the inside of the gallbladder.  This was  evacuated out.  We dissected further, but at this time the decision was made  that it was too dangerous to  proceed laparoscopically, considering the  extreme amount of inflammation and distortion to the anatomy, so we  converted to an open procedure.  A right upper quadrant incision was made,  continuing 2 cm below the costal margin, extending from the epigastric port  site.  The subcutaneous tissue, down to the  fascia and muscular layers were  divided, and the abdomen was entered.  The Bookwalter retractor was used.  It was attempted to take the gallbladder down in a retrograde fashion, but  it was extremely friable, and the gallbladder was entered during the  dissection.  Two large 2 cm diameter stones were removed.  Further  dissection proceeded laterally and medially, though this was somewhat slow-  going.  We then turned our directions down to the infundibular area, as we  had better exposure, and careful dissection brought the cystic duct into  easy visualization.  This was carefully dissected and a window was made  between the infundibulum and the cystic duct and over an area which was  encompassed.  Three clips were placed on the proximal portion of the cystic  duct, one distally, and it was divided.  Further dissection then revealed a  cystic artery.  This was circumferentially dissected.  This was clipped  three times proximally, once distally, and divided.  There was some  persistent bleeding from the posterior branch of the cystic artery that was  not  controlled with the clips.  We used two suture ligatures of #3-0 silk,  obtaining good hemostasis from the stump of the cystic artery.  Once that  was accomplished, the gallbladder was removed the rest of the way off of the  liver bed, leaving a small portion of the back wall which was used where the  ligament could not be dissected.  The specimen was sent to pathology.  The  portion of the back wall was left on the liver bed and was coagulated with a  high powered Bovie.  We then Bovied the liver bed to get good hemostasis.  Once this was accomplished, two pieces of Surgicel were placed down into the  liver bed and some traction was held.  The area was then irrigated and  rechecked.  Bovie cautery was used to get excellent hemostasis and a second  piece of Surgicel was placed.  Pressure was held for several minutes.  The  area was re-examined and noted to be completely hemostatic.  The abdomen was  copiously irrigated.  A 19-French JP drain was placed.  The liver bed was  checked again and there was no active bleeding, and the sponge, needle and  instrument counts were checked and noted to be correct.  We proceeded with  closure in a routine fashion.  The fascia was closed in two separate layers  with running #1 PDS suture.  The subcutaneous tissues were irrigated and the  skin was closed with staples.  The skin of the one remaining lateral port  was closed with staples, and the umbilical incision fascia had already been  closed by tying the pursestring Vicryl and subsequently the skin of all the  incisions was closed with staples.  The sponge, needle and instrument counts  were again correct.  Sterile dressings were applied.  The patient tolerated the procedure well without complications, and was  taken to the recovery room in stable condition.                                                Gabrielle Dare Janee Morn, M.D.   BET/MEDQ  D:  03/10/2003  T:  03/11/2003  Job:  [id number]

## 2010-12-23 NOTE — Discharge Summary (Signed)
   NAME:  Raymond Rios, Raymond Rios                                ACCOUNT NO.:  [id number]   MEDICAL RECORD NO.:  [id number]                   PATIENT TYPE:  INP   LOCATION:  5732                                 FACILITY:  [facility]   PHYSICIAN:  Gabrielle Dare. Janee Morn, M.D.             DATE OF BIRTH:  04-15-1931   DATE OF ADMISSION:  03/10/2003  DATE OF DISCHARGE:  03/13/2003                                 DISCHARGE SUMMARY   DISCHARGE DIAGNOSIS:  Status post open cholecystectomy.   HISTORY OF PRESENT ILLNESS:  The patient is a 77 year old white male who has  a known history of gallstones for 20 years, who had a recent presentation to  the emergency department with right upper quadrant pain which was diagnosed  as biliary colic, and he presented for elective cholecystectomy on March 10, 2003.   HOSPITAL COURSE:  This patient underwent a laparoscopic converted to open  cholecystectomy due to severely inflamed empyema of the gallbladder and  acute cholecystitis.  He tolerated the procedure very well.  A JP drain was  left in postoperatively.  He remained afebrile.  He was continued on IV  antibiotics, the JP drain gradually tapered.  His white count was initially  mildly elevated to 13 but normalized to 10.  He tolerated gradual  advancement of his diet.  He was moving around well out of bed.  He had  return of bowel function and was advanced to a low-fat diet on the day of  discharge; he tolerated this well and had good pain control on oral pain  medications, remained afebrile and hemodynamically stable.  JP drain was  removed and he is discharged home.   DISCHARGE CONDITION:  Stable.   DISCHARGE ACTIVITY:  No lifting.   DISCHARGE DIET:  Low fat.   DISCHARGE MEDICATIONS:  Percocet 5/325 mg one to two every six hours p.r.n.  pain.   FOLLOWUP:  Followup will be next weeks in our urgent office for staple  removal.                                                 Gabrielle Dare. Janee Morn, M.D.    BET/MEDQ  D:  03/13/2003  T:  03/14/2003  Job:  [id number]

## 2011-01-30 ENCOUNTER — Other Ambulatory Visit: Payer: Self-pay | Admitting: Family Medicine

## 2011-05-19 ENCOUNTER — Telehealth: Payer: Self-pay | Admitting: Family Medicine

## 2011-05-19 ENCOUNTER — Emergency Department (HOSPITAL_COMMUNITY)
Admission: EM | Admit: 2011-05-19 | Discharge: 2011-05-19 | Disposition: A | Discharge status: Home / Self Care | Payer: Medicare Other | Attending: Emergency Medicine | Admitting: Emergency Medicine

## 2011-05-19 DIAGNOSIS — J45909 Unspecified asthma, uncomplicated: Secondary | ICD-10-CM | POA: Insufficient documentation

## 2011-05-19 DIAGNOSIS — I10 Essential (primary) hypertension: Secondary | ICD-10-CM | POA: Insufficient documentation

## 2011-05-19 DIAGNOSIS — T07XXXA Unspecified multiple injuries, initial encounter: Secondary | ICD-10-CM | POA: Insufficient documentation

## 2011-05-19 DIAGNOSIS — Y92009 Unspecified place in unspecified non-institutional (private) residence as the place of occurrence of the external cause: Secondary | ICD-10-CM | POA: Insufficient documentation

## 2011-05-19 DIAGNOSIS — M79609 Pain in unspecified limb: Secondary | ICD-10-CM | POA: Insufficient documentation

## 2011-05-19 DIAGNOSIS — IMO0002 Reserved for concepts with insufficient information to code with codable children: Secondary | ICD-10-CM | POA: Insufficient documentation
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
## 2012-08-19 ENCOUNTER — Other Ambulatory Visit: Payer: Self-pay | Admitting: *Deleted

## 2012-08-19 MED ORDER — SIMVASTATIN 40 MG PO TABS: ORAL_TABLET | ORAL | Status: DC

## 2013-04-25 ENCOUNTER — Ambulatory Visit: Payer: Self-pay | Admitting: Neurology

## 2013-10-03 ENCOUNTER — Ambulatory Visit: Payer: Medicare Other | Admitting: Adult Health

## 2013-10-30 ENCOUNTER — Encounter: Payer: Self-pay | Admitting: Adult Health

## 2013-10-30 ENCOUNTER — Ambulatory Visit (INDEPENDENT_AMBULATORY_CARE_PROVIDER_SITE_OTHER): Payer: Commercial Managed Care - HMO | Admitting: Adult Health

## 2013-10-30 ENCOUNTER — Encounter (INDEPENDENT_AMBULATORY_CARE_PROVIDER_SITE_OTHER): Payer: Self-pay

## 2013-10-30 VITALS — BP 108/64 | HR 52 | Temp 98.7°F | Ht 67.5 in | Wt 181.0 lb

## 2013-10-30 DIAGNOSIS — I10 Essential (primary) hypertension: Secondary | ICD-10-CM

## 2013-10-30 DIAGNOSIS — R252 Cramp and spasm: Secondary | ICD-10-CM

## 2013-10-30 DIAGNOSIS — E785 Hyperlipidemia, unspecified: Secondary | ICD-10-CM

## 2013-10-30 HISTORY — DX: Essential (primary) hypertension: I10

## 2013-10-30 HISTORY — DX: Cramp and spasm: R25.2

## 2013-10-30 HISTORY — DX: Hyperlipidemia, unspecified: E78.5

## 2013-10-30 LAB — BASIC METABOLIC PANEL
BUN: 17 mg/dL (ref 6–23)
CO2: 30 mEq/L (ref 19–32)
Calcium: 9.4 mg/dL (ref 8.4–10.5)
Chloride: 102 mEq/L (ref 96–112)
Creatinine, Ser: 1.1 mg/dL (ref 0.4–1.5)
GFR: 67.66 mL/min (ref >60.00)
Glucose, Bld: 96 mg/dL (ref 70–99)
Potassium: 4.9 mEq/L (ref 3.5–5.1)
Sodium: 139 mEq/L (ref 135–145)

## 2013-10-30 LAB — MAGNESIUM: Magnesium: 1.8 mg/dL (ref 1.5–2.5)

## 2013-10-30 LAB — HEPATIC FUNCTION PANEL
ALT: 25 U/L (ref 0–53)
AST: 28 U/L (ref 0–37)
Albumin: 4.1 g/dL (ref 3.5–5.2)
Alkaline Phosphatase: 43 U/L (ref 39–117)
BILIRUBIN DIRECT: 0.1 mg/dL (ref 0.0–0.3)
TOTAL PROTEIN: 7 g/dL (ref 6.0–8.3)
Total Bilirubin: 0.8 mg/dL (ref 0.3–1.2)

## 2013-10-30 LAB — VITAMIN B12: VITAMIN B 12: 814 pg/mL (ref 211–911)

## 2013-10-30 NOTE — Progress Notes (Signed)
Pre visit review using our clinic review tool, if applicable. No additional management support is needed unless otherwise documented below in the visit note. 

## 2013-10-30 NOTE — Progress Notes (Signed)
Patient ID: Raymond Rios, male   DOB: 1930-09-17, 80 y.o.   MRN: [id number]   Subjective:    Patient ID: Raymond Rios, male    DOB: 1931/06/29, 80 y.o.   MRN: [id number]  HPI  Raymond Rios is a pleasant 80 y/o male who presents to clinic to establish care. Recently followed at [facility] in [place], [place]. Will request records. Last Medicare Wellness Exam in Dec 17, 2012 less than 1 year ago. He is on medication for his HLD and cholesterol well controlled. He is concerned about his liver. His wife died 05/13/15with liver failure. He also reports hx of Myasthenia Gravis. Has muscle weakness but he reports mild symptoms. Was diagnosed by Dr. Sherryll Burger, Neurology at [facility] in Dec 17, 2012. Will request records.  Past Medical History  Diagnosis Date  . Anemia   . Arthritis   . History of chickenpox   . Depression   . GERD (gastroesophageal reflux disease)   . Allergy   . Hypertension   . Hyperlipidemia   . History of colon polyps   . Urine incontinence   . Myasthenia gravis     Current Outpatient Prescriptions on File Prior to Visit  Medication Sig Dispense Refill  . atenolol (TENORMIN) 25 MG tablet TAKE 1 TABLET EVERY NIGHT  90 tablet  2  . pantoprazole (PROTONIX) 40 MG tablet TAKE 1 TABLET DAILY  90 tablet  2  . simvastatin (ZOCOR) 40 MG tablet TAKE 1 TABLET AT NIGHT * Needs to schedule appointment for any additional refills*  90 tablet  0   No current facility-administered medications on file prior to visit.     Review of Systems  Constitutional: Negative.   HENT: Negative.   Eyes: Negative.   Respiratory: Negative.   Cardiovascular: Negative.   Gastrointestinal: Negative.   Endocrine: Negative.   Genitourinary: Negative.   Musculoskeletal: Positive for gait problem (Myasthenia gravis - muscle weakness). Negative for back pain.       Cramps in legs at night  Skin: Negative.   Allergic/Immunologic: Negative.   Neurological: Negative.   Hematological: Negative.   Psychiatric/Behavioral:  Negative.        Objective:  BP 108/64  Pulse 52  Temp(Src) 98.7 F (37.1 C) (Oral)  Ht 5' 7.5" (1.715 m)  Wt 181 lb (82.101 kg)  BMI 27.91 kg/m2  SpO2 98%   Physical Exam  Constitutional: He is oriented to person, place, and time. He appears well-developed and well-nourished. No distress.  HENT:  Head: Normocephalic and atraumatic.  Eyes: Conjunctivae and EOM are normal.  Neck: Normal range of motion. Neck supple. No tracheal deviation present.  Cardiovascular: Normal rate, regular rhythm, normal heart sounds and intact distal pulses.  Exam reveals no gallop and no friction rub.   No murmur heard. Pulmonary/Chest: Effort normal and breath sounds normal. No respiratory distress. He has no wheezes. He has no rales.  Abdominal: He exhibits no mass. There is no guarding.  Musculoskeletal: Normal range of motion. He exhibits no edema and no tenderness.  Neurological: He is alert and oriented to person, place, and time. He has normal reflexes. Coordination normal.  Myasthenia Gravis. Mild muscle weakness  Skin: Skin is warm and dry.  Psychiatric: He has a normal mood and affect. His behavior is normal. Judgment and thought content normal.       Assessment & Plan:   1. HLD (hyperlipidemia) Recently had cholesterol checked and well controlled on zocor. Continue to follow. Check liver panel. - Hepatic  function panel  2. HTN (hypertension) Well controlled on atenolol. Check bmet. Continue to follow - Basic metabolic panel  3. Cramp in limb Cramp during the night. Check magnesium, electrolytes, B12 - Vitamin B12 - Magnesium

## 2013-10-31 ENCOUNTER — Encounter: Payer: Self-pay | Admitting: *Deleted

## 2013-10-31 ENCOUNTER — Telehealth: Payer: Self-pay | Admitting: Adult Health

## 2013-10-31 NOTE — Telephone Encounter (Signed)
Relevant patient education mailed to patient.  

## 2013-11-03 ENCOUNTER — Telehealth: Payer: Self-pay | Admitting: Adult Health

## 2013-11-03 NOTE — Telephone Encounter (Signed)
States he was to call to tell Raquel when his last physical was:  06/2012.    Did not want to schedule CPE at this time; wants to see what Raquel says.

## 2013-11-04 NOTE — Telephone Encounter (Signed)
He can schedule his medicare wellness exam at his earliest convenience. I just wanted to make sure that it had been at least 1 year. Otherwise, Medicare will not pay.

## 2013-11-05 NOTE — Telephone Encounter (Signed)
Left vm requesting pt to return my call 

## 2013-11-06 NOTE — Telephone Encounter (Signed)
Left vm on cell phone to return my call

## 2013-11-10 NOTE — Telephone Encounter (Signed)
Notified pt. 

## 2013-11-19 ENCOUNTER — Ambulatory Visit (INDEPENDENT_AMBULATORY_CARE_PROVIDER_SITE_OTHER): Payer: Medicare HMO | Admitting: Adult Health

## 2013-11-19 ENCOUNTER — Encounter: Payer: Self-pay | Admitting: Adult Health

## 2013-11-19 VITALS — BP 162/72 | HR 53 | Temp 98.2°F | Resp 14 | Ht 67.5 in | Wt 181.0 lb

## 2013-11-19 DIAGNOSIS — R3911 Hesitancy of micturition: Secondary | ICD-10-CM

## 2013-11-19 DIAGNOSIS — Z125 Encounter for screening for malignant neoplasm of prostate: Secondary | ICD-10-CM

## 2013-11-19 DIAGNOSIS — Z0000 Encounter for general adult medical examination without abnormal findings: Secondary | ICD-10-CM

## 2013-11-19 HISTORY — DX: Encounter for general adult medical examination without abnormal findings: Z00.00

## 2013-11-19 HISTORY — DX: Hesitancy of micturition: R39.11

## 2013-11-19 HISTORY — DX: Encounter for screening for malignant neoplasm of prostate: Z12.5

## 2013-11-19 LAB — POCT URINALYSIS DIPSTICK
Bilirubin, UA: NEGATIVE
Blood, UA: NEGATIVE
Glucose, UA: NEGATIVE
Ketones, UA: NEGATIVE
Leukocytes, UA: NEGATIVE
Nitrite, UA: NEGATIVE
PH UA: 7
PROTEIN UA: NEGATIVE
SPEC GRAV UA: 1.015
UROBILINOGEN UA: 0.2

## 2013-11-19 MED ORDER — TAMSULOSIN HCL 0.4 MG PO CAPS: 0.4000 mg | ORAL_CAPSULE | Freq: Every day | ORAL | Status: DC

## 2013-11-19 NOTE — Progress Notes (Signed)
Patient ID: Raymond Rios, male   DOB: 02-13-1931, 80 y.o.   MRN: [id number]    Subjective:    Patient ID: Raymond Rios, male    DOB: 02-13-1931, 80 y.o.   MRN: [id number]  HPI  The patient is here for annual Medicare wellness examination and management of other chronic and acute problems.   The risk factors are reflected in the social history.  The roster of all physicians providing medical care to patient is listed in the Snapshot section of the chart.  Activities of daily living:  The patient is 100% independent in all ADLs: dressing, toileting, bathing, feeding as well as independent mobility.  Instrumental Activities of daily living: The patient is 100% independent in all iADLs: cooking, driving, keeping track of finances, managing medications, shopping, using telephone and computer.  Home safety: The patient has smoke detectors in the home. Seatbelts are worn 100%.  There are firearms in the home and they are safely stored. There is no violence in the home. No hx of IPV.  There is no risks for hepatitis, STDs or HIV. There is no history of blood transfusion. No travel history to infectious disease endemic areas of the world.  The patient has seen dentist in the last six month. Pt has seen eye doctor in the last year. Next appointment Dec 05, 2013. No hearing impairment. They have deferred audiologic testing in the last year.  No excessive sun exposure. Discussed the need for sun protection: hats, long sleeves and use of sunscreen if there is significant sun exposure.   Diet: the importance of a healthy diet is discussed. Pt follows a healthy diet.  The benefits of regular aerobic exercise were discussed. Works outside in the yard.  Depression screen: there are no signs or vegative symptoms of depression- irritability, change in appetite, anhedonia, sadness/tearfullness.  Cognitive assessment: the patient manages all their financial and personal affairs and is actively engaged. Able to  relate day,date,year and events; recalled 2/3 objects at 3 minutes; performed clock-face test normally.  The following portions of the patient's history were reviewed and updated as appropriate: allergies, current medications, past family history, past medical history,  past surgical history, past social history  and problem list.  Visual acuity was not assessed per patient preference since has regular follow up with ophthalmologist. Hearing and body mass index were assessed and reviewed.   During the course of the visit the patient was educated and counseled about appropriate screening and preventive services including : fall prevention , diabetes screening, nutrition counseling, colorectal cancer screening, and recommended immunizations.      Past Medical History  Diagnosis Date  . Anemia   . Arthritis   . History of chickenpox   . Depression   . GERD (gastroesophageal reflux disease)   . Allergy   . Hypertension   . Hyperlipidemia   . History of colon polyps   . Urine incontinence   . Myasthenia gravis      Past Surgical History  Procedure Laterality Date  . Cholecystectomy    . Tonsillectomy and adenoidectomy       Family History  Problem Relation Age of Onset  . Hyperlipidemia Mother   . Arthritis Father   . Hyperlipidemia Father      History   Social History  . Marital Status: Married    Spouse Name: N/A    Number of Children: 3  . Years of Education: N/A   Occupational History  . Retired  Social History Main Topics  . Smoking status: Former Games developermoker  . Smokeless tobacco: Not on file     Comment: quit around 1970's  . Alcohol Use: No  . Drug Use: No  . Sexual Activity: Not on file   Other Topics Concern  . Not on file   Social History Narrative  . No narrative on file     Current Outpatient Prescriptions on File Prior to Visit  Medication Sig Dispense Refill  . atenolol (TENORMIN) 25 MG tablet TAKE 1 TABLET EVERY NIGHT  90 tablet  2  .  pantoprazole (PROTONIX) 40 MG tablet TAKE 1 TABLET DAILY  90 tablet  2  . simvastatin (ZOCOR) 40 MG tablet TAKE 1 TABLET AT NIGHT * Needs to schedule appointment for any additional refills*  90 tablet  0  . terazosin (HYTRIN) 2 MG capsule Take 2 mg by mouth daily.      . vitamin B-12 (CYANOCOBALAMIN) 1000 MCG tablet Take 1,000 mcg by mouth daily.      . Vitamins A & D (VITAMIN A & D) 10000-400 UNITS CAPS Take 1,000 mg by mouth daily.       No current facility-administered medications on file prior to visit.     Review of Systems  Constitutional: Negative.   HENT: Negative.   Eyes: Negative.   Respiratory: Negative.   Cardiovascular: Negative.   Gastrointestinal: Negative.   Endocrine: Negative.   Genitourinary: Positive for dysuria and frequency. Negative for urgency, discharge, scrotal swelling, penile pain and testicular pain.       He has been taking terazosin and dose not feel that this is helping him any longer.  Musculoskeletal: Negative.   Skin: Negative.   Allergic/Immunologic: Negative.   Neurological: Negative.   Hematological: Negative.   Psychiatric/Behavioral: Negative.        Objective:  BP 162/72  Pulse 53  Temp(Src) 98.2 F (36.8 C) (Oral)  Ht 5' 7.5" (1.715 m)  Wt 181 lb (82.101 kg)  BMI 27.91 kg/m2  SpO2 95%   Physical Exam  Constitutional: He is oriented to person, place, and time. He appears well-developed and well-nourished. No distress.  HENT:  Head: Normocephalic and atraumatic.  Right Ear: External ear normal.  Left Ear: External ear normal.  Nose: Nose normal.  Mouth/Throat: Oropharynx is clear and moist.  Eyes: Conjunctivae and EOM are normal. Pupils are equal, round, and reactive to light.  Neck: Normal range of motion. Neck supple. No tracheal deviation present. No thyromegaly present.  Cardiovascular: Normal rate, regular rhythm, normal heart sounds and intact distal pulses.  Exam reveals no gallop and no friction rub.   No murmur  heard. Pulmonary/Chest: Effort normal and breath sounds normal. No respiratory distress. He has no wheezes. He has no rales.  Abdominal: Soft. Bowel sounds are normal. He exhibits no distension and no mass. There is no tenderness. There is no rebound and no guarding.  Musculoskeletal: Normal range of motion. He exhibits no edema and no tenderness.  Lymphadenopathy:    He has no cervical adenopathy.  Neurological: He is alert and oriented to person, place, and time. He has normal reflexes. No cranial nerve deficit. Coordination normal.  Skin: Skin is warm and dry.  Psychiatric: He has a normal mood and affect. His behavior is normal. Judgment and thought content normal.       Assessment & Plan:    1. Routine general medical examination at a health care facility Normal physical exam. All screenings reviewed and addressed with  pt.  2. Hesitancy UA without any evidence of infection. Start Flomax. RTC in 2 months for follow up - POCT urinalysis dipstick

## 2013-11-19 NOTE — Patient Instructions (Signed)
  You had your Medicare Wellness Exam today.  Please leave a urine sample before leaving.  I will contact you with the results and any instructions or new medications.  I have sent a prescription to your pharmacy for Flomax. Take this at bedtime to see if it helps your symptoms.  Return to see me for follow up in 2 month.

## 2013-11-19 NOTE — Progress Notes (Signed)
Pre visit review using our clinic review tool, if applicable. No additional management support is needed unless otherwise documented below in the visit note. 

## 2013-11-21 ENCOUNTER — Telehealth: Payer: Self-pay | Admitting: *Deleted

## 2013-11-21 NOTE — Telephone Encounter (Signed)
Pt called wanting to know if we've received the PA for his Flomax

## 2013-11-22 NOTE — Telephone Encounter (Signed)
I have not seen the PA for flomax???

## 2013-12-01 DIAGNOSIS — Z0279 Encounter for issue of other medical certificate: Secondary | ICD-10-CM

## 2013-12-02 NOTE — Telephone Encounter (Signed)
Completed PreAuthorization for Tamsulosin using online website

## 2013-12-09 ENCOUNTER — Telehealth: Payer: Self-pay | Admitting: *Deleted

## 2013-12-09 MED ORDER — TAMSULOSIN HCL 0.4 MG PO CAPS: 0.4000 mg | ORAL_CAPSULE | Freq: Every day | ORAL | Status: DC

## 2013-12-09 NOTE — Telephone Encounter (Signed)
Pt notified that his Tamsulosin was approved by his insurance. He states he needs the Rx sent to [facility]. Rx sent to pharmacy by escript

## 2013-12-16 ENCOUNTER — Other Ambulatory Visit: Payer: Self-pay | Admitting: *Deleted

## 2013-12-16 MED ORDER — TAMSULOSIN HCL 0.4 MG PO CAPS: 0.4000 mg | ORAL_CAPSULE | Freq: Every day | ORAL | Status: DC

## 2013-12-16 NOTE — Telephone Encounter (Signed)
Pt needed Rx sent to [facility]. Rx sent to pharmacy by escript. Left message, notifying pt.

## 2013-12-22 ENCOUNTER — Telehealth: Payer: Self-pay | Admitting: Adult Health

## 2013-12-22 NOTE — Telephone Encounter (Signed)
Pt calling to check status of Flomax through [facility].  Pt states he gets very irritated with our system here as he has been waiting for his medication for a while.  Pt states our office is getting to be too busy and he is thinking of going elsewhere for care.  Advised pt we did not want to lose him as a pt and that we would check with [facility] to ensure his rx was in route.

## 2013-12-23 NOTE — Telephone Encounter (Signed)
Left voicemail on patient's cell phone. Stating his Rx was sent to [facility] on 12/16/13 at 11:30am. Left call back number for patient if he had any questions.

## 2014-01-02 ENCOUNTER — Encounter: Payer: Commercial Managed Care - HMO | Admitting: Adult Health

## 2014-01-02 NOTE — Progress Notes (Signed)
Pre visit review using our clinic review tool, if applicable. No additional management support is needed unless otherwise documented below in the visit note. 

## 2014-01-06 NOTE — Progress Notes (Signed)
This encounter was created in error - please disregard.

## 2014-01-19 ENCOUNTER — Ambulatory Visit: Payer: Commercial Managed Care - HMO | Admitting: Adult Health

## 2014-01-20 ENCOUNTER — Other Ambulatory Visit: Payer: Self-pay | Admitting: *Deleted

## 2014-01-20 MED ORDER — SIMVASTATIN 40 MG PO TABS
ORAL_TABLET | ORAL | Status: DC
Start: 2014-01-20 — End: 2017-06-01

## 2014-02-16 ENCOUNTER — Ambulatory Visit (INDEPENDENT_AMBULATORY_CARE_PROVIDER_SITE_OTHER): Payer: Commercial Managed Care - HMO | Admitting: Adult Health

## 2014-02-16 ENCOUNTER — Encounter: Payer: Self-pay | Admitting: Adult Health

## 2014-02-16 VITALS — BP 133/77 | HR 60 | Temp 97.9°F | Resp 14 | Wt 179.5 lb

## 2014-02-16 DIAGNOSIS — R3911 Hesitancy of micturition: Secondary | ICD-10-CM

## 2014-02-16 DIAGNOSIS — Z23 Encounter for immunization: Secondary | ICD-10-CM

## 2014-02-16 DIAGNOSIS — I10 Essential (primary) hypertension: Secondary | ICD-10-CM

## 2014-02-16 DIAGNOSIS — K649 Unspecified hemorrhoids: Secondary | ICD-10-CM

## 2014-02-16 HISTORY — DX: Encounter for immunization: Z23

## 2014-02-16 MED ORDER — ZOSTER VACCINE LIVE 19400 UNT/0.65ML ~~LOC~~ SOLR: 0.6500 mL | Freq: Once | SUBCUTANEOUS | Status: DC

## 2014-02-16 MED ORDER — HYDROCORTISONE ACETATE 25 MG RE SUPP: RECTAL | Status: DC

## 2014-02-16 NOTE — Progress Notes (Signed)
Pre visit review using our clinic review tool, if applicable. No additional management support is needed unless otherwise documented below in the visit note. 

## 2014-02-16 NOTE — Progress Notes (Signed)
Patient ID: Raymond Rios, male   DOB: 1930/08/29, 80 y.o.   MRN: [id number]   Subjective:    Patient ID: Raymond Rios, male    DOB: 10/20/1930, 80 y.o.   MRN: [id number]  HPI  Raymond Rios is a pleasant 80 y/o male who presents to clinic for follow up HTN. He also needs his shingles vaccine. Pertaining to HTN, he is doing well. He takes his medications as prescribed. No side effects reported. He was experiencing some hesitancy and was prescribed flomax. He reports taking this every night. Slight improvement noted but not much.   Past Medical History  Diagnosis Date  . Anemia   . Arthritis   . History of chickenpox   . Depression   . GERD (gastroesophageal reflux disease)   . Allergy   . Hypertension   . Hyperlipidemia   . History of colon polyps   . Urine incontinence   . Myasthenia gravis     Current Outpatient Prescriptions on File Prior to Visit  Medication Sig Dispense Refill  . atenolol (TENORMIN) 25 MG tablet TAKE 1 TABLET EVERY NIGHT  90 tablet  2  . pantoprazole (PROTONIX) 40 MG tablet TAKE 1 TABLET DAILY  90 tablet  2  . simvastatin (ZOCOR) 40 MG tablet TAKE 1 TABLET AT NIGHT  90 tablet  3  . tamsulosin (FLOMAX) 0.4 MG CAPS capsule Take 1 capsule (0.4 mg total) by mouth daily.  90 capsule  3  . vitamin B-12 (CYANOCOBALAMIN) 1000 MCG tablet Take 1,000 mcg by mouth daily.      . Vitamins A & D (VITAMIN A & D) 10000-400 UNITS CAPS Take 1,000 mg by mouth daily.       No current facility-administered medications on file prior to visit.     Review of Systems  Constitutional: Negative.  Negative for fever.  HENT: Negative.   Eyes: Negative.   Respiratory: Negative.  Negative for cough, shortness of breath and wheezing.   Cardiovascular: Negative.  Negative for chest pain and leg swelling.  Gastrointestinal: Negative.  Negative for blood in stool.       Hx of hemorrhoids. Has been using hydrocordisone supp as needed.  Endocrine: Negative.   Genitourinary: Negative.  Negative  for difficulty urinating.       Taking flomax at bedtime  Musculoskeletal: Negative.   Skin: Negative.   Allergic/Immunologic: Negative.   Neurological: Negative.  Negative for dizziness, tremors, weakness and light-headedness.  Hematological: Negative.   Psychiatric/Behavioral: Negative.  Negative for behavioral problems, confusion and sleep disturbance. The patient is not nervous/anxious.   All other systems reviewed and are negative.      Objective:  BP 133/77  Pulse 60  Temp(Src) 97.9 F (36.6 C) (Oral)  Resp 14  Wt 179 lb 8 oz (81.421 kg)  SpO2 96%   Physical Exam  Constitutional: He is oriented to person, place, and time. He appears well-developed and well-nourished. No distress.  HENT:  Head: Normocephalic and atraumatic.  Eyes: Conjunctivae and EOM are normal.  Cardiovascular: Normal rate, regular rhythm, normal heart sounds and intact distal pulses.  Exam reveals no gallop and no friction rub.   No murmur heard. Pulmonary/Chest: Effort normal and breath sounds normal. No respiratory distress. He has no wheezes. He has no rales.  Musculoskeletal: Normal range of motion.  Neurological: He is alert and oriented to person, place, and time.  Skin: Skin is warm and dry.  Psychiatric: He has a normal mood and affect. His behavior  is normal. Judgment and thought content normal.      Assessment & Plan:   1. HYPERTENSION Well controlled on medication. No syncope or near syncope. Continue to monitor.  2. Need for shingles vaccine Prescription [facility] [address] for administration  3. Hemorrhoids, unspecified hemorrhoid type Continue hydrocortisone supp at bedtime as needed.  4. Hesitancy Taking flomax at bedtime. Continue to follow.

## 2014-02-16 NOTE — Patient Instructions (Signed)
  I sent in a prescription for your [facility]ingles [facility] to [facility] [address] for them to administer.  Please return for follow up in January. You will need to be fasting to have blood work drawn.  Call with any questions or concerns.

## 2014-02-24 ENCOUNTER — Other Ambulatory Visit: Payer: Self-pay

## 2014-02-24 ENCOUNTER — Telehealth: Payer: Self-pay | Admitting: Adult Health

## 2014-02-24 MED ORDER — HYDROCORTISONE ACETATE 25 MG RE SUPP: RECTAL | Status: DC

## 2014-02-24 NOTE — Telephone Encounter (Signed)
The patient wants the prescription to be sent to [facility] . Please give the patient a call back when this has been completed .  hydrocortisone (ANUSOL-HC) 25 MG suppository

## 2014-02-24 NOTE — Telephone Encounter (Signed)
Patient has not left me any voicemails on my phone for me to address his issue. Rx was sent to mail order pharmacy as patient requested.

## 2014-02-24 NOTE — Telephone Encounter (Signed)
Pt stopped by the office to complain about getting intouch with staff for his meds. His rx (hydrocortisone 25 mg suppository) [facility] and not [facility]. Pt stated that he is tried of getting the run around and is going to another PCP. Pt request that his rx still be sent to mail order/msn

## 2014-02-24 NOTE — Telephone Encounter (Signed)
Ok to send prescription

## 2014-02-25 ENCOUNTER — Other Ambulatory Visit: Payer: Self-pay | Admitting: *Deleted

## 2014-02-25 MED ORDER — HYDROCORTISONE ACETATE 25 MG RE SUPP
RECTAL | Status: DC
Start: 2014-02-25 — End: 2017-06-01

## 2014-02-25 NOTE — Telephone Encounter (Signed)
rx sent

## 2014-11-25 ENCOUNTER — Ambulatory Visit
Admit: 2014-11-25 | Disposition: A | Discharge status: Home / Self Care | Payer: Self-pay | Attending: Internal Medicine | Admitting: Internal Medicine

## 2015-07-02 ENCOUNTER — Encounter: Payer: Self-pay | Admitting: Gastroenterology

## 2017-05-29 ENCOUNTER — Encounter
Admission: EM | Disposition: A | Discharge status: Home Health | Payer: Self-pay | Source: Home / Self Care | Attending: Specialist

## 2017-05-29 ENCOUNTER — Inpatient Hospital Stay
Admission: EM | Admit: 2017-05-29 | Discharge: 2017-06-01 | DRG: 280 | Disposition: A | Discharge status: Home Health | Payer: Medicare PPO | Attending: Specialist | Admitting: Specialist

## 2017-05-29 ENCOUNTER — Encounter: Payer: Self-pay | Admitting: Internal Medicine

## 2017-05-29 DIAGNOSIS — I110 Hypertensive heart disease with heart failure: Secondary | ICD-10-CM | POA: Diagnosis present

## 2017-05-29 DIAGNOSIS — Z86010 Personal history of colonic polyps: Secondary | ICD-10-CM | POA: Diagnosis not present

## 2017-05-29 DIAGNOSIS — I493 Ventricular premature depolarization: Secondary | ICD-10-CM | POA: Diagnosis present

## 2017-05-29 DIAGNOSIS — I255 Ischemic cardiomyopathy: Secondary | ICD-10-CM | POA: Diagnosis present

## 2017-05-29 DIAGNOSIS — K219 Gastro-esophageal reflux disease without esophagitis: Secondary | ICD-10-CM | POA: Diagnosis present

## 2017-05-29 DIAGNOSIS — I2102 ST elevation (STEMI) myocardial infarction involving left anterior descending coronary artery: Secondary | ICD-10-CM

## 2017-05-29 DIAGNOSIS — Z23 Encounter for immunization: Secondary | ICD-10-CM

## 2017-05-29 DIAGNOSIS — R410 Disorientation, unspecified: Secondary | ICD-10-CM | POA: Diagnosis not present

## 2017-05-29 DIAGNOSIS — Z8261 Family history of arthritis: Secondary | ICD-10-CM

## 2017-05-29 DIAGNOSIS — J449 Chronic obstructive pulmonary disease, unspecified: Secondary | ICD-10-CM | POA: Diagnosis present

## 2017-05-29 DIAGNOSIS — M1990 Unspecified osteoarthritis, unspecified site: Secondary | ICD-10-CM | POA: Diagnosis present

## 2017-05-29 DIAGNOSIS — Z8349 Family history of other endocrine, nutritional and metabolic diseases: Secondary | ICD-10-CM

## 2017-05-29 DIAGNOSIS — N400 Enlarged prostate without lower urinary tract symptoms: Secondary | ICD-10-CM | POA: Diagnosis present

## 2017-05-29 DIAGNOSIS — I2109 ST elevation (STEMI) myocardial infarction involving other coronary artery of anterior wall: Principal | ICD-10-CM | POA: Diagnosis present

## 2017-05-29 DIAGNOSIS — I480 Paroxysmal atrial fibrillation: Secondary | ICD-10-CM | POA: Diagnosis present

## 2017-05-29 DIAGNOSIS — G7000 Myasthenia gravis without (acute) exacerbation: Secondary | ICD-10-CM | POA: Diagnosis present

## 2017-05-29 DIAGNOSIS — Z9049 Acquired absence of other specified parts of digestive tract: Secondary | ICD-10-CM

## 2017-05-29 DIAGNOSIS — I472 Ventricular tachycardia: Secondary | ICD-10-CM | POA: Diagnosis present

## 2017-05-29 DIAGNOSIS — R0602 Shortness of breath: Secondary | ICD-10-CM | POA: Diagnosis not present

## 2017-05-29 DIAGNOSIS — I213 ST elevation (STEMI) myocardial infarction of unspecified site: Secondary | ICD-10-CM | POA: Diagnosis present

## 2017-05-29 DIAGNOSIS — F0390 Unspecified dementia without behavioral disturbance: Secondary | ICD-10-CM | POA: Diagnosis present

## 2017-05-29 DIAGNOSIS — I2510 Atherosclerotic heart disease of native coronary artery without angina pectoris: Secondary | ICD-10-CM | POA: Diagnosis present

## 2017-05-29 DIAGNOSIS — Z87891 Personal history of nicotine dependence: Secondary | ICD-10-CM

## 2017-05-29 DIAGNOSIS — I5021 Acute systolic (congestive) heart failure: Secondary | ICD-10-CM | POA: Diagnosis present

## 2017-05-29 DIAGNOSIS — L309 Dermatitis, unspecified: Secondary | ICD-10-CM | POA: Diagnosis present

## 2017-05-29 DIAGNOSIS — Z882 Allergy status to sulfonamides status: Secondary | ICD-10-CM

## 2017-05-29 DIAGNOSIS — I10 Essential (primary) hypertension: Secondary | ICD-10-CM

## 2017-05-29 DIAGNOSIS — E785 Hyperlipidemia, unspecified: Secondary | ICD-10-CM | POA: Diagnosis present

## 2017-05-29 DIAGNOSIS — R079 Chest pain, unspecified: Secondary | ICD-10-CM

## 2017-05-29 HISTORY — PX: LEFT HEART CATH AND CORONARY ANGIOGRAPHY: CATH118249

## 2017-05-29 HISTORY — DX: Ischemic cardiomyopathy: I25.5

## 2017-05-29 HISTORY — DX: Atherosclerotic heart disease of native coronary artery without angina pectoris: I25.10

## 2017-05-29 HISTORY — DX: Paroxysmal atrial fibrillation: I48.0

## 2017-05-29 LAB — CBC WITH DIFFERENTIAL/PLATELET
BASOS ABS: 0 10*3/uL (ref 0–0.1)
Basophils Relative: 0 %
EOS ABS: 0 10*3/uL (ref 0–0.7)
EOS PCT: 0 %
HCT: 38.6 % — ABNORMAL LOW (ref 40.0–52.0)
Hemoglobin: 12.6 g/dL — ABNORMAL LOW (ref 13.0–18.0)
LYMPHS ABS: 0.7 10*3/uL — AB (ref 1.0–3.6)
LYMPHS PCT: 6 %
MCH: 27.9 pg (ref 26.0–34.0)
MCHC: 32.5 g/dL (ref 32.0–36.0)
MCV: 85.8 fL (ref 80.0–100.0)
MONO ABS: 1.2 10*3/uL — AB (ref 0.2–1.0)
Monocytes Relative: 10 %
Neutro Abs: 9.7 10*3/uL — ABNORMAL HIGH (ref 1.4–6.5)
Neutrophils Relative %: 84 %
PLATELETS: 153 10*3/uL (ref 150–440)
RBC: 4.51 MIL/uL (ref 4.40–5.90)
RDW: 12.9 % (ref 11.5–14.5)
WBC: 11.7 10*3/uL — AB (ref 3.8–10.6)

## 2017-05-29 LAB — LIPID PANEL
CHOL/HDL RATIO: 2.5 ratio
Cholesterol: 116 mg/dL (ref 0–200)
HDL: 47 mg/dL (ref >40)
LDL Cholesterol: 55 mg/dL (ref 0–99)
Triglycerides: 71 mg/dL (ref <150)
VLDL: 14 mg/dL (ref 0–40)

## 2017-05-29 LAB — COMPREHENSIVE METABOLIC PANEL
ALT: 57 U/L (ref 17–63)
AST: 339 U/L — ABNORMAL HIGH (ref 15–41)
Albumin: 4.1 g/dL (ref 3.5–5.0)
Alkaline Phosphatase: 39 U/L (ref 38–126)
Anion gap: 10 (ref 5–15)
BILIRUBIN TOTAL: 0.9 mg/dL (ref 0.3–1.2)
BUN: 27 mg/dL — ABNORMAL HIGH (ref 6–20)
CHLORIDE: 101 mmol/L (ref 101–111)
CO2: 27 mmol/L (ref 22–32)
CREATININE: 0.96 mg/dL (ref 0.61–1.24)
Calcium: 9.5 mg/dL (ref 8.9–10.3)
Glucose, Bld: 137 mg/dL — ABNORMAL HIGH (ref 65–99)
POTASSIUM: 4.7 mmol/L (ref 3.5–5.1)
Sodium: 138 mmol/L (ref 135–145)
TOTAL PROTEIN: 7.4 g/dL (ref 6.5–8.1)

## 2017-05-29 LAB — PROTIME-INR
INR: 1.04
Prothrombin Time: 13.5 seconds (ref 11.4–15.2)

## 2017-05-29 LAB — APTT: APTT: 24 s (ref 24–36)

## 2017-05-29 LAB — TROPONIN I
TROPONIN I: 53.64 ng/mL — AB (ref <0.03)
TROPONIN I: 45.69 ng/mL — AB (ref <0.03)

## 2017-05-29 LAB — MRSA PCR SCREENING: MRSA by PCR: NEGATIVE

## 2017-05-29 SURGERY — LEFT HEART CATH AND CORONARY ANGIOGRAPHY
Anesthesia: Moderate Sedation

## 2017-05-29 MED ORDER — HEPARIN SODIUM (PORCINE) 1000 UNIT/ML IJ SOLN
INTRAMUSCULAR | Status: AC
  Filled 2017-05-29: qty 1

## 2017-05-29 MED ORDER — DOCUSATE SODIUM 100 MG PO CAPS: 100.0000 mg | ORAL_CAPSULE | Freq: Two times a day (BID) | ORAL | Status: DC | PRN

## 2017-05-29 MED ORDER — TAMSULOSIN HCL 0.4 MG PO CAPS
0.4000 mg | ORAL_CAPSULE | Freq: Every day | ORAL | Status: DC
  Administered 2017-05-30 – 2017-06-01 (×3): 0.4 mg via ORAL
  Filled 2017-05-29 (×3): qty 1

## 2017-05-29 MED ORDER — ATORVASTATIN CALCIUM 20 MG PO TABS
40.0000 mg | ORAL_TABLET | Freq: Every day | ORAL | Status: DC
  Administered 2017-05-29 – 2017-05-31 (×3): 40 mg via ORAL
  Filled 2017-05-29 (×3): qty 2

## 2017-05-29 MED ORDER — VERAPAMIL HCL 2.5 MG/ML IV SOLN
INTRAVENOUS | Status: AC
  Filled 2017-05-29: qty 2

## 2017-05-29 MED ORDER — ACETAMINOPHEN 325 MG PO TABS
650.0000 mg | ORAL_TABLET | ORAL | Status: DC | PRN
  Administered 2017-05-29: 650 mg via ORAL
  Filled 2017-05-29: qty 2

## 2017-05-29 MED ORDER — SODIUM CHLORIDE 0.9 % IV SOLN: 250.0000 mL | INTRAVENOUS | Status: DC | PRN

## 2017-05-29 MED ORDER — CARVEDILOL 3.125 MG PO TABS
3.1250 mg | ORAL_TABLET | Freq: Two times a day (BID) | ORAL | Status: DC
  Administered 2017-05-29 – 2017-06-01 (×6): 3.125 mg via ORAL
  Filled 2017-05-29 (×6): qty 1

## 2017-05-29 MED ORDER — HEPARIN (PORCINE) IN NACL 2-0.9 UNIT/ML-% IJ SOLN
INTRAMUSCULAR | Status: AC
  Filled 2017-05-29: qty 500

## 2017-05-29 MED ORDER — HEPARIN (PORCINE) IN NACL 100-0.45 UNIT/ML-% IJ SOLN
1050.0000 [IU]/h | INTRAMUSCULAR | Status: DC
  Administered 2017-05-29 – 2017-05-30 (×2): 900 [IU]/h via INTRAVENOUS
  Filled 2017-05-29 (×2): qty 250

## 2017-05-29 MED ORDER — IOPAMIDOL (ISOVUE-300) INJECTION 61%
INTRAVENOUS | Status: DC | PRN
Start: 2017-05-29 — End: 2017-05-29
  Administered 2017-05-29: 95 mL via INTRA_ARTERIAL

## 2017-05-29 MED ORDER — SODIUM CHLORIDE 0.9% FLUSH: 3.0000 mL | INTRAVENOUS | Status: DC | PRN

## 2017-05-29 MED ORDER — SODIUM CHLORIDE 0.9% FLUSH
3.0000 mL | Freq: Two times a day (BID) | INTRAVENOUS | Status: DC
  Administered 2017-05-29 – 2017-05-30 (×3): 3 mL via INTRAVENOUS

## 2017-05-29 MED ORDER — INFLUENZA VAC SPLIT HIGH-DOSE 0.5 ML IM SUSY
0.5000 mL | PREFILLED_SYRINGE | INTRAMUSCULAR | Status: AC
Start: 2017-05-30 — End: 2017-05-30
  Administered 2017-05-30: 0.5 mL via INTRAMUSCULAR
  Filled 2017-05-29: qty 0.5

## 2017-05-29 MED ORDER — NITROGLYCERIN 5 MG/ML IV SOLN
INTRAVENOUS | Status: AC
  Filled 2017-05-29: qty 10

## 2017-05-29 MED ORDER — CLOPIDOGREL BISULFATE 75 MG PO TABS
300.0000 mg | ORAL_TABLET | Freq: Once | ORAL | Status: AC
  Administered 2017-05-29: 300 mg via ORAL
  Filled 2017-05-29: qty 4

## 2017-05-29 MED ORDER — ONDANSETRON HCL 4 MG/2ML IJ SOLN: 4.0000 mg | Freq: Four times a day (QID) | INTRAMUSCULAR | Status: DC | PRN

## 2017-05-29 MED ORDER — HEPARIN SODIUM (PORCINE) 5000 UNIT/ML IJ SOLN: 5000.0000 [IU] | Freq: Three times a day (TID) | INTRAMUSCULAR | Status: DC

## 2017-05-29 MED ORDER — BIVALIRUDIN TRIFLUOROACETATE 250 MG IV SOLR
INTRAVENOUS | Status: AC
  Filled 2017-05-29: qty 250

## 2017-05-29 MED ORDER — ASPIRIN 81 MG PO CHEW
81.0000 mg | CHEWABLE_TABLET | Freq: Every day | ORAL | Status: DC
  Administered 2017-05-30 – 2017-06-01 (×3): 81 mg via ORAL
  Filled 2017-05-29 (×3): qty 1

## 2017-05-29 MED ORDER — PANTOPRAZOLE SODIUM 40 MG PO TBEC
40.0000 mg | DELAYED_RELEASE_TABLET | Freq: Every day | ORAL | Status: DC
  Administered 2017-05-30 – 2017-06-01 (×3): 40 mg via ORAL
  Filled 2017-05-29 (×3): qty 1

## 2017-05-29 MED ORDER — ASPIRIN 81 MG PO CHEW
324.0000 mg | CHEWABLE_TABLET | Freq: Once | ORAL | Status: AC
  Administered 2017-05-29: 324 mg via ORAL

## 2017-05-29 MED ORDER — CLOPIDOGREL BISULFATE 75 MG PO TABS
75.0000 mg | ORAL_TABLET | Freq: Every day | ORAL | Status: DC
  Administered 2017-05-30 – 2017-06-01 (×3): 75 mg via ORAL
  Filled 2017-05-29 (×3): qty 1

## 2017-05-29 MED ORDER — VITAMIN B-12 1000 MCG PO TABS
1000.0000 ug | ORAL_TABLET | Freq: Every day | ORAL | Status: DC
  Administered 2017-05-30 – 2017-06-01 (×3): 1000 ug via ORAL
  Filled 2017-05-29 (×3): qty 1

## 2017-05-29 SURGICAL SUPPLY — 12 items
CATH INFINITI 5FR ANG PIGTAIL (CATHETERS) ×3 IMPLANT
CATH INFINITI JR4 5F (CATHETERS) ×3 IMPLANT
CATH LAUNCHER 6FR EBU3.5 (CATHETERS) ×3 IMPLANT
DEVICE INFLAT 30 PLUS (MISCELLANEOUS) ×3 IMPLANT
GLIDESHEATH SLEND SS 6F .021 (SHEATH) ×3 IMPLANT
KIT MANI 3VAL PERCEP (MISCELLANEOUS) ×3 IMPLANT
PACK CARDIAC CATH (CUSTOM PROCEDURE TRAY) ×3 IMPLANT
SET INTRO CAPELLA COAXIAL (SET/KITS/TRAYS/PACK) ×3 IMPLANT
SHEATH AVANTI 6FR X 11CM (SHEATH) ×3 IMPLANT
WIRE EMERALD 3MM-J .035X150CM (WIRE) ×3 IMPLANT
WIRE ROSEN-J .035X260CM (WIRE) ×3 IMPLANT
WIRE RUNTHROUGH .014X180CM (WIRE) IMPLANT

## 2017-05-29 NOTE — Progress Notes (Signed)
Chaplain responded to a Code STEMI page for pt in ED09. Pt arrived via EMS was evaluated by the medical team and moved to [facility] for a procedure. No family member was present. Cardiologist tried to call pt's son, but couldn't reach him, and promised to call pt's daughter. CH provided ministry of presence and pastoral support to pt and medical team.   05/29/17 1600  Clinical Encounter Type  Visited With Patient;Health care provider  Visit Type Initial;Spiritual support;Other (Comment)  Referral From Nurse  Consult/Referral To Chaplain  Spiritual Encounters  Spiritual Needs Prayer;Emotional;Other (Comment)

## 2017-05-29 NOTE — Consult Note (Signed)
Cardiology Consultation:   Patient ID: Raymond NuttingHal D Allocca; [id number]; Aug 03, 1931   Admit date: 05/29/2017 Date of Consult: 05/29/2017  Primary Care Provider: Novella Oliveey, Raquel M, NP Primary Cardiologist: New - Janes Colegrove Primary Electrophysiologist:  None   Patient Profile:   Raymond Rios is a 83 y.o. male with a hx of hypertension, hyperlipidemia, COPD, and myasthenia gravis him a who is being seen today for the evaluation of chest pain and abnormal EKG at the request of Dr. Mayford KnifeWilliams.  History of Present Illness:   Raymond Rios was in his usual state of health until yesterday around noon, when he developed severe substernal chest pain radiating to both arms.  He continued to have pain throughout the evening and into today but ultimately called EMS this afternoon at the urging of his family.  When EMS arrived, Raymond Rios was found to have anteroseptal ST elevation, prompting STEMI alert.  In the emergency department, he received aspirin 324 mg.  His pain decreased from 8/10 and maximal intensity yesterday to 1/10 at the time of arrival in the ED.  He continued to have pain in both shoulders.  He denies shortness of breath, palpitations, lightheadedness, diaphoresis, and nausea.  Raymond Rios has had intermittent chest pains for years, similar to what he has experienced over the last day, though the discomfort would typically resolve on its own within a few minutes.  He reports a cardiac catheterization many years ago, which he believes was normal.  After a discussion of his condition as well as the risks and benefits of cardiac catheterization, Raymond Rios underwent emergent left heart catheterization.  He was found to have a total occlusion of the proximal LAD with heavy thrombus burden.  Given that his chest pain had resolved at that time and his symptom onset was more than 24 hours ago, the decision was made not to proceed with primary PCI.   Past Medical History:  Diagnosis Date  . Allergy   . Anemia   . Arthritis     . Depression   . GERD (gastroesophageal reflux disease)   . History of chickenpox   . History of colon polyps   . Hyperlipidemia   . Hypertension   . Myasthenia gravis (HCC)   . Urine incontinence     Past Surgical History:  Procedure Laterality Date  . CHOLECYSTECTOMY    . TONSILLECTOMY AND ADENOIDECTOMY       Home Medications:  Prior to Admission medications   Medication Sig Start Date Avishai Reihl Date Taking? Authorizing Provider  atenolol (TENORMIN) 25 MG tablet TAKE 1 TABLET EVERY NIGHT 01/30/11   Arta SilenceSchaller, Robert N, MD  hydrocortisone (ANUSOL-HC) 25 MG suppository 1 suppository rectally at bedtime as needed. 02/25/14   Novella Oliveey, Raquel M, NP  pantoprazole (PROTONIX) 40 MG tablet TAKE 1 TABLET DAILY 01/30/11   Arta SilenceSchaller, Robert N, MD  simvastatin (ZOCOR) 40 MG tablet TAKE 1 TABLET AT NIGHT 01/20/14   Rey, Richarda Overlieaquel M, NP  tamsulosin (FLOMAX) 0.4 MG CAPS capsule Take 1 capsule (0.4 mg total) by mouth daily. 12/16/13   Rey, Richarda Overlieaquel M, NP  vitamin B-12 (CYANOCOBALAMIN) 1000 MCG tablet Take 1,000 mcg by mouth daily. 09/29/13   [provider]  Vitamins A & D (VITAMIN A & D) 10000-400 UNITS CAPS Take 1,000 mg by mouth daily. 09/29/13   [provider]  zoster vaccine live, PF, (ZOSTAVAX) [id number] UNT/0.65ML injection Inject 19,400 Units into the skin once. 02/16/14   Novella Oliveey, Raquel M, NP    Allergies:  Allergies  Allergen Reactions  . Sulfonamide Derivatives     REACTION: unspecified    Social History:   Social History   Social History  . Marital status: Married    Spouse name: N/A  . Number of children: 3  . Years of education: N/A   Occupational History  . Retired Retired   Social History Main Topics  . Smoking status: Former Games developer  . Smokeless tobacco: Former Neurosurgeon     Comment: quit around 1970's  . Alcohol use No  . Drug use: No  . Sexual activity: No   Other Topics Concern  . Not on file   Social History Narrative  . No narrative on file    Family History:    Family History  Problem Relation Age of Onset  . Hyperlipidemia Mother   . Arthritis Father   . Hyperlipidemia Father      ROS:  Review of Systems  Constitution: Negative.  HENT: Negative.   Eyes: Negative.   Cardiovascular: Positive for chest pain.  Respiratory: Negative.   Endocrine: Negative.   Hematologic/Lymphatic: Negative.   Skin: Negative.   Musculoskeletal: Negative.   Gastrointestinal: Negative.   Genitourinary: Negative.   Neurological: Positive for tremors.  Psychiatric/Behavioral: The patient is nervous/anxious.   Allergic/Immunologic: Negative.     Physical Exam/Data:   Vitals:   05/29/17 1605 05/29/17 1630 05/29/17 1700 05/29/17 1800  BP: 115/67 128/66 120/63 129/63  Pulse: (!) 57 61 63 62  Resp:  18 18 (!) 25  Temp: 97.6 F (36.4 C)     TempSrc: Oral     SpO2: 98% 97% 91% 98%  Weight: 154 lb 15.7 oz (70.3 kg)     Height: 5\' 10"  (1.778 m)       Intake/Output Summary (Last 24 hours) at 05/29/17 1850 Last data filed at 05/29/17 1800  Gross per 24 hour  Intake            19.17 ml  Output                0 ml  Net            19.17 ml   Filed Weights   05/29/17 1453 05/29/17 1605  Weight: 154 lb (69.9 kg) 154 lb 15.7 oz (70.3 kg)   Body mass index is 22.24 kg/m.  General:  Well nourished, well developed, in no acute distress HEENT: normal Lymph: no adenopathy Neck: no JVD Endocrine:  No thryomegaly Vascular: No carotid bruits; FA pulses 2+ bilaterally without bruits  Cardiac: Bradycardic but regular without murmurs or rubs. Lungs: Normal work of breathing.  Clear to auscultation bilaterally. Abd: Soft, nontender, nondistended without hepatosplenomegaly. Ext: No lower extremity edema. Musculoskeletal:  No deformities, BUE and BLE strength normal and equal Skin: warm and dry  Neuro:  CNs 2-12 intact, no focal abnormalities noted Psych:  Normal affect   EKG:  The EKG was personally reviewed and demonstrates: Dermal sinus rhythm with single  PVC, anteroseptal ST elevation and Q waves.  Subtle ST elevation in aVL and depressions in III and aVF. Telemetry:  Telemetry was personally reviewed and demonstrates: Normal sinus rhythm with occasional PVCs.  Relevant CV Studies: LHC (05/29/17): 1. Thrombotic total occlusion of proximal LAD. 2. Dominant LCx with 50% OM3 stenosis and 80% lPDA lesion. 3. Anterior and apical akinesis with LVEF ~35%. 4. Moderately elevated left ventricular filling pressure.  Laboratory Data:  Chemistry  Recent Labs Lab 05/29/17 1443  NA 138  K 4.7  CL 101  CO2 27  GLUCOSE 137*  BUN 27*  CREATININE 0.96  CALCIUM 9.5  GFRNONAA >60  GFRAA >60  ANIONGAP 10     Recent Labs Lab 05/29/17 1443  PROT 7.4  ALBUMIN 4.1  AST 339*  ALT 57  ALKPHOS 39  BILITOT 0.9   Hematology  Recent Labs Lab 05/29/17 1443  WBC 11.7*  RBC 4.51  HGB 12.6*  HCT 38.6*  MCV 85.8  MCH 27.9  MCHC 32.5  RDW 12.9  PLT 153   Cardiac Enzymes  Recent Labs Lab 05/29/17 1443  TROPONINI 53.64*   No results for input(s): TROPIPOC in the last 168 hours.  BNPNo results for input(s): BNP, PROBNP in the last 168 hours.  DDimer No results for input(s): DDIMER in the last 168 hours.  Radiology/Studies:  No results found.  Assessment and Plan:   Anterior STEMI (late presenting) Mr. Thomann has EKG changes and cath findings consistent with a late presenting anterior STEMI with occlusion of the proximal LAD (onset > 24 hours ago).  Initial troponin drawn at the time of ED presentation was already quite elevated at 54.  As has had minimal chest pain since arriving in the ED (currently asymptomatic) remained hemodynamically/electrically stable, we have agreed to proceed with medical therapy.  Admit to ICU for at least 24 hours of monitoring; then could consider transfer to telemetry.  Patient is at high risk for post MI complications secondary to his large un-revascularized LAD territory infarct.  Dual antiplatelet  therapy with aspirin and clopidogrel for up to 12 months, as tolerated.  If no evidence of bleeding from radial and femoral cath sites, consider starting heparin infusion (ACS nomogram without bolus) 4 hours after hemostasis.  Switch simvastatin to atorvastatin 40 mg daily.  Check fasting lipid panel in the morning.  Hold atenolol.  Consider adding carvedilol 3.125 mg twice daily with careful up titration as heart rate and blood pressure allow.  Trend troponin until it has peaked, then stop.  I favor medical management of LCx disease unless the patient has recurrent chest pain or develops cardiogenic shock.  Ischemic cardiomyopathy LVEF found to be moderate to severely reduced with moderately elevated left ventricular filling pressure at the time of catheterization.  Mr. Fitzwater does not have any symptoms of decompensated heart failure at this time.  Obtain transthoracic echocardiogram to further assess LV function and other potential structural abnormalities.  Hold atenolol and consider starting carvedilol 3.125 mg twice daily, as heart rate allows.  If blood pressure and renal function allow, begin ACE inhibitor or ARB tomorrow.  Spironolactone should also be considered prior to discharge, as renal function and potassium allow.  Hold IV fluids today.  Consider gentle diuresis beginning overnight or tomorrow.  Hypertension Blood pressure is normal at this time.  Hold atenolol for now with plan to start carvedilol and ACEI/ARB prior to discharge.   Signed, Yvonne Kendall, MD  05/29/2017 6:50 PM

## 2017-05-29 NOTE — Consult Note (Signed)
Name: Raymond Rios MRN: [id number] DOB: 02-26-1931    ADMISSION DATE:  05/29/2017  CONSULTATION DATE:  05/29/17  REFERRING MD :  Dr.Vachhani  CHIEF COMPLAINT:  Chest Pain  BRIEF PATIENT DESCRIPTION: 83 year old male with anterior wall MI  SIGNIFICANT EVENTS  05/29/17  Patient admitted to the ICU with anterior wall MI for observation STUDIES: 05/29/17 Cardiac Cath>> Conclusions: 1. Thrombotic total occlusion of proximal LAD. 2. Dominant LCx with 50% OM3 stenosis and 80% lPDA lesion. 3. Anterior and apical akinesis with LVEF ~35%. Moderately elevated left ventricular filling pressure  HISTORY OF PRESENT ILLNESS:  Raymond Rios is a an 83 year old male with known history of Hypertension,Hyperlipidemia,COPD and MG.  Patient presented to ED on 10/23 with chest pain .  EKG was concerning for Anterior Wall MI.  Patient underwent left heart catheterization.  He was found to have total occlusion of the proximal LAD with heavy thrombus burden.  Patient had minimal chest pain ,hemodynamically stable and onset of symptoms >24 hours therefore the cardiologist decided to medically manage the symptoms.  Patient admitted to the ICU for observation. PAST MEDICAL HISTORY :   has a past medical history of Allergy; Anemia; Arthritis; Depression; GERD (gastroesophageal reflux disease); History of chickenpox; History of colon polyps; Hyperlipidemia; Hypertension; Myasthenia gravis (HCC); and Urine incontinence.  has a past surgical history that includes Cholecystectomy and Tonsillectomy and adenoidectomy. Prior to Admission medications   Medication Sig Start Date End Date Taking? Authorizing Provider  atenolol (TENORMIN) 25 MG tablet TAKE 1 TABLET EVERY NIGHT 01/30/11   Arta Silence, MD  hydrocortisone (ANUSOL-HC) 25 MG suppository 1 suppository rectally at bedtime as needed. 02/25/14   Novella Olive, NP  pantoprazole (PROTONIX) 40 MG tablet TAKE 1 TABLET DAILY 01/30/11   Arta Silence, MD  simvastatin  (ZOCOR) 40 MG tablet TAKE 1 TABLET AT NIGHT 01/20/14   Rey, Richarda Overlie, NP  tamsulosin (FLOMAX) 0.4 MG CAPS capsule Take 1 capsule (0.4 mg total) by mouth daily. 12/16/13   Rey, Richarda Overlie, NP  vitamin B-12 (CYANOCOBALAMIN) 1000 MCG tablet Take 1,000 mcg by mouth daily. 09/29/13   [provider]  Vitamins A & D (VITAMIN A & D) 10000-400 UNITS CAPS Take 1,000 mg by mouth daily. 09/29/13   [provider]  zoster vaccine live, PF, (ZOSTAVAX) [id number] UNT/0.65ML injection Inject 19,400 Units into the skin once. 02/16/14   Novella Olive, NP   Allergies  Allergen Reactions  . Sulfonamide Derivatives     REACTION: unspecified    FAMILY HISTORY:  family history includes Arthritis in his father; Hyperlipidemia in his father and mother. SOCIAL HISTORY:  reports that he has quit smoking. He has quit using smokeless tobacco. He reports that he does not drink alcohol or use drugs.  REVIEW OF SYSTEMS:   Constitutional: Negative for fever, chills, weight loss, malaise/fatigue and diaphoresis.  HENT: Negative for hearing loss, ear pain, nosebleeds, congestion, sore throat, neck pain, tinnitus and ear discharge.   Eyes: Negative for blurred vision, double vision, photophobia, pain, discharge and redness.  Respiratory: Negative for cough, hemoptysis, sputum production, shortness of breath, wheezing and stridor.   Cardiovascular: Negative for chest pain, palpitations, orthopnea, claudication, leg swelling and PND.  Gastrointestinal: Negative for heartburn, nausea, vomiting, abdominal pain, diarrhea, constipation, blood in stool and melena.  Genitourinary: Negative for dysuria, urgency, frequency, hematuria and flank pain.  Musculoskeletal: Negative for myalgias, back pain, joint pain and falls.  Skin: Negative for itching and  rash.  Neurological: Negative for dizziness, tingling, tremors, sensory change, speech change, focal weakness, seizures, loss of consciousness, weakness and headaches.    Endo/Heme/Allergies: Negative for environmental allergies and polydipsia. Does not bruise/bleed easily.  SUBJECTIVE: Patient states "that he has some pain"  VITAL SIGNS: Temp:  [97.6 F (36.4 C)-98.4 F (36.9 C)] 97.6 F (36.4 C) (10/23 1605) Pulse Rate:  [57-63] 59 (10/23 1900) Resp:  [18-30] 30 (10/23 1900) BP: (115-130)/(61-67) 129/61 (10/23 1900) SpO2:  [91 %-99 %] 96 % (10/23 1900) Weight:  [154 lb (69.9 kg)-154 lb 15.7 oz (70.3 kg)] 154 lb 15.7 oz (70.3 kg) (10/23 1605)  PHYSICAL EXAMINATION: General: Elderly male,in no acute distress Neuro:  Awake,Alert and oriented HEENT:  AT,[place],No jvd Cardiovascular: SR,Regular, no m/r/g Lungs:  Clear bilaterally, no wheezes,crackles and rhonchi Abdomen:  Soft,nt,nd,Positive Bowel sounds Musculoskeletal:  No edema,cyanosis Skin: Warm,dry and intact   Recent Labs Lab 05/29/17 1443  NA 138  K 4.7  CL 101  CO2 27  BUN 27*  CREATININE 0.96  GLUCOSE 137*    Recent Labs Lab 05/29/17 1443  HGB 12.6*  HCT 38.6*  WBC 11.7*  PLT 153   No results found.  ASSESSMENT / PLAN: Anterior wall MI Ischemic Cardiomyopathy Hypertension Hyperlipidemia  Plan Cardiology following the patient,follow recommendation Dual antiplatelet therapy with clopidogrel and aspirin for up to 12 months If no bleeding from from radial and femoral cath sites, will start on heparin Continue Atorvastatin Hold Atenolol,add coreg 3.125 mg BID Will start on ACE/ARB  Prior to discharge. F/U echo Trend Troponin Gentle diuresis 10/24    Bincy Varughese,AG-ACNP Pulmonary and Critical Care Medicine [facility] 05/29/2017, 8:02 PM   Agree with above.  Transfer to MedSurg with cardiac monitoring 10/24 Billy Fischeravid Keilyn Haggard, MD PCCM service Mobile [phone] Pager [phone] 05/30/2017 1:36 PM

## 2017-05-29 NOTE — ED Notes (Signed)
Dr. Okey DupreEnd at bedside. Per MD patient to go to cath lab immediately. Aspirin PO given. Consent form signed. MD did not want to wait for second line or heparin , "they can do that there". Pt transferred to cath lab. Report given to nurse.

## 2017-05-29 NOTE — Progress Notes (Signed)
Site still without complication at beginning of shift. Order from MD to start heparin. Heparin started, Pt began to speak of shoulder pain 5/10 that is intermittent, that radiates to chest with certain movements. Pt also had a 5 beat run of Vtach. MD called order to give coreg, MD informed of HR 55-60s, order to still give coreg and tylenol PO. Administered, pt sleeping, will continue to monitor.

## 2017-05-29 NOTE — H&P (Signed)
[employer] -  at [facility]   PATIENT NAME: Raymond Rios    MR#:  [id number]  DATE OF BIRTH:  [birth year]  DATE OF ADMISSION:  05/29/2017  PRIMARY CARE PHYSICIAN: Novella Oliveey, Raquel M, NP   REQUESTING/REFERRING PHYSICIAN: End  CHIEF COMPLAINT:   Chief Complaint  Patient presents with  . Chest Pain    HISTORY OF PRESENT ILLNESS: Raymond Rios  is a 83 y.o. male with a known history of Htn, Hyperlipidemia, have On-off chest pains for last 5-6 mths. Yesterday he had pain in chest from 12 to 8 pm , then resolved some, but continue to have on-off pain in night and today, so Came to ER, Noted to have STEMI- taken for cath- Noted total occlusion of LAD, as symptoms onset > 24 hrs ago, no interventions done, suggested medical management.  PAST MEDICAL HISTORY:   Past Medical History:  Diagnosis Date  . Allergy   . Anemia   . Arthritis   . Depression   . GERD (gastroesophageal reflux disease)   . History of chickenpox   . History of colon polyps   . Hyperlipidemia   . Hypertension   . Myasthenia gravis (HCC)   . Urine incontinence     PAST SURGICAL HISTORY: Past Surgical History:  Procedure Laterality Date  . CHOLECYSTECTOMY    . TONSILLECTOMY AND ADENOIDECTOMY      SOCIAL HISTORY:  Social History  Substance Use Topics  . Smoking status: Former Games developermoker  . Smokeless tobacco: Not on file     Comment: quit around 1970's  . Alcohol use No    FAMILY HISTORY:  Family History  Problem Relation Age of Onset  . Hyperlipidemia Mother   . Arthritis Father   . Hyperlipidemia Father     DRUG ALLERGIES:  Allergies  Allergen Reactions  . Sulfonamide Derivatives     REACTION: unspecified    REVIEW OF SYSTEMS:   CONSTITUTIONAL: No fever, fatigue or weakness.  EYES: No blurred or double vision.  EARS, NOSE, AND THROAT: No tinnitus or ear pain.  RESPIRATORY: No cough, shortness of breath, wheezing or hemoptysis.  CARDIOVASCULAR: positive for chest pain, no  orthopnea, edema.  GASTROINTESTINAL: No nausea, vomiting, diarrhea or abdominal pain.  GENITOURINARY: No dysuria, hematuria.  ENDOCRINE: No polyuria, nocturia,  HEMATOLOGY: No anemia, easy bruising or bleeding SKIN: No rash or lesion. MUSCULOSKELETAL: No joint pain or arthritis.   NEUROLOGIC: No tingling, numbness, weakness.  PSYCHIATRY: No anxiety or depression.   MEDICATIONS AT HOME:  Prior to Admission medications   Medication Sig Start Date End Date Taking? Authorizing Provider  atenolol (TENORMIN) 25 MG tablet TAKE 1 TABLET EVERY NIGHT 01/30/11   Arta SilenceSchaller, Robert N, MD  hydrocortisone (ANUSOL-HC) 25 MG suppository 1 suppository rectally at bedtime as needed. 02/25/14   Novella Oliveey, Raquel M, NP  pantoprazole (PROTONIX) 40 MG tablet TAKE 1 TABLET DAILY 01/30/11   Arta SilenceSchaller, Robert N, MD  simvastatin (ZOCOR) 40 MG tablet TAKE 1 TABLET AT NIGHT 01/20/14   Rey, Richarda Overlieaquel M, NP  tamsulosin (FLOMAX) 0.4 MG CAPS capsule Take 1 capsule (0.4 mg total) by mouth daily. 12/16/13   Rey, Richarda Overlieaquel M, NP  vitamin B-12 (CYANOCOBALAMIN) 1000 MCG tablet Take 1,000 mcg by mouth daily. 09/29/13   [provider]  Vitamins A & D (VITAMIN A & D) 10000-400 UNITS CAPS Take 1,000 mg by mouth daily. 09/29/13   [provider]  zoster vaccine live, PF, (ZOSTAVAX) [id number] UNT/0.65ML injection Inject 19,400 Units into the skin  once. 02/16/14   Rey, Richarda Overlie, NP      PHYSICAL EXAMINATION:   VITAL SIGNS: Blood pressure 128/66, pulse 61, temperature 97.6 F (36.4 C), temperature source Oral, resp. rate 18, height 5\' 10"  (1.778 m), weight 70.3 kg (154 lb 15.7 oz), SpO2 97 %.  GENERAL:  83 y.o.-year-old patient lying in the bed with no acute distress.  EYES: Pupils equal, round, reactive to light and accommodation. No scleral icterus. Extraocular muscles intact.  HEENT: Head atraumatic, normocephalic. Oropharynx and nasopharynx clear.  NECK:  Supple, no jugular venous distention. No thyroid enlargement, no tenderness.   LUNGS: Normal breath sounds bilaterally, no wheezing, rales,rhonchi or crepitation. No use of accessory muscles of respiration.  CARDIOVASCULAR: S1, S2 normal. No murmurs, rubs, or gallops.  ABDOMEN: Soft, nontender, nondistended. Bowel sounds present. No organomegaly or mass.  EXTREMITIES: No pedal edema, cyanosis, or clubbing.  NEUROLOGIC: Cranial nerves II through XII are intact. Muscle strength 5/5 in all extremities. Sensation intact. Gait not checked.  PSYCHIATRIC: The patient is alert and oriented x 3.  SKIN: No obvious rash, lesion, or ulcer.   LABORATORY PANEL:   CBC  Recent Labs Lab 05/29/17 1443  WBC 11.7*  HGB 12.6*  HCT 38.6*  PLT 153  MCV 85.8  MCH 27.9  MCHC 32.5  RDW 12.9  LYMPHSABS 0.7*  MONOABS 1.2*  EOSABS 0.0  BASOSABS 0.0   ------------------------------------------------------------------------------------------------------------------  Chemistries   Recent Labs Lab 05/29/17 1443  NA 138  K 4.7  CL 101  CO2 27  GLUCOSE 137*  BUN 27*  CREATININE 0.96  CALCIUM 9.5  AST 339*  ALT 57  ALKPHOS 39  BILITOT 0.9   ------------------------------------------------------------------------------------------------------------------ estimated creatinine clearance is 57 mL/min (by C-G formula based on SCr of 0.96 mg/dL). ------------------------------------------------------------------------------------------------------------------ No results for input(s): TSH, T4TOTAL, T3FREE, THYROIDAB in the last 72 hours.  Invalid input(s): FREET3   Coagulation profile  Recent Labs Lab 05/29/17 1443  INR 1.04   ------------------------------------------------------------------------------------------------------------------- No results for input(s): DDIMER in the last 72 hours. -------------------------------------------------------------------------------------------------------------------  Cardiac Enzymes  Recent Labs Lab 05/29/17 1443   TROPONINI 53.64*   ------------------------------------------------------------------------------------------------------------------ Invalid input(s): POCBNP  ---------------------------------------------------------------------------------------------------------------  Urinalysis    Component Value Date/Time   BILIRUBINUR neg 11/19/2013 1559   PROTEINUR neg 11/19/2013 1559   UROBILINOGEN 0.2 11/19/2013 1559   NITRITE neg 11/19/2013 1559   LEUKOCYTESUR Negative 11/19/2013 1559     RADIOLOGY: No results found.  EKG: Orders placed or performed during the hospital encounter of 05/29/17  . EKG 12-Lead  . EKG 12-Lead  . EKG 12-Lead    IMPRESSION AND PLAN:  * STEMI   Complete occlusion of proximal LAD   No interventions done.   Medical management.   ASA, Plavix, Atorvastatin.   Echo.  * Hyperlipidemia   Atorvastatin.  * Hypertension   Monitor, may add betablocker later.  All the records are reviewed and case discussed with ED provider. Management plans discussed with the patient, family and they are in agreement.  CODE STATUS: Full.    Code Status Orders        Start     Ordered   05/29/17 1625  Full code  Continuous     05/29/17 1625    Code Status History    Date Active Date Inactive Code Status Order ID Comments User Context   This patient has a current code status but no historical code status.    Advance Directive Documentation     Most Recent Value  Type  of Advance Directive  Living will, Healthcare Power of Attorney  Pre-existing out of facility DNR order (yellow form or pink MOST form)  -  "MOST" Form in Place?  -       TOTAL TIME TAKING CARE OF THIS PATIENT: 45 minutes.    Altamese Dilling M.D on 05/29/2017   Between 7am to 6pm - Pager - [phone]  After 6pm go to [email or web address] - password [email or web address]  [facility]  Office  [phone]  CC: Primary care physician; Novella Olive, NP   Note: This  dictation was prepared with Dragon dictation along with smaller phrase technology. Any transcriptional errors that result from this process are unintentional.

## 2017-05-29 NOTE — ED Triage Notes (Signed)
Pt presents to ED via EMS c/o central chest pain >24 hours. Per EMS , EKG shows ST elevation in lead 2

## 2017-05-29 NOTE — Progress Notes (Signed)
ANTICOAGULATION CONSULT NOTE - Initial Consult  Pharmacy Consult for Heparin  Indication: chest pain/ACS/STEMI  Allergies  Allergen Reactions  . Sulfonamide Derivatives     REACTION: unspecified    Patient Measurements: Height: 5\' 10"  (177.8 cm) Weight: 154 lb 15.7 oz (70.3 kg) IBW/kg (Calculated) : 73 Heparin Dosing Weight: 70  Vital Signs: Temp: 97.6 F (36.4 C) (10/23 1605) Temp Source: Oral (10/23 1605) BP: 129/61 (10/23 1900) Pulse Rate: 59 (10/23 1900)  Labs:  Recent Labs  05/29/17 1443  HGB 12.6*  HCT 38.6*  PLT 153  APTT 24  LABPROT 13.5  INR 1.04  CREATININE 0.96  TROPONINI 53.64*    Estimated Creatinine Clearance: 57 mL/min (by C-G formula based on SCr of 0.96 mg/dL).   Medical History: Past Medical History:  Diagnosis Date  . Allergy   . Anemia   . Arthritis   . Depression   . GERD (gastroesophageal reflux disease)   . History of chickenpox   . History of colon polyps   . Hyperlipidemia   . Hypertension   . Myasthenia gravis (HCC)   . Urine incontinence     Medications:  Prescriptions Prior to Admission  Medication Sig Dispense Refill Last Dose  . atenolol (TENORMIN) 25 MG tablet TAKE 1 TABLET EVERY NIGHT 90 tablet 2 Taking  . hydrocortisone (ANUSOL-HC) 25 MG suppository 1 suppository rectally at bedtime as needed. 15 suppository 3   . pantoprazole (PROTONIX) 40 MG tablet TAKE 1 TABLET DAILY 90 tablet 2 Taking  . simvastatin (ZOCOR) 40 MG tablet TAKE 1 TABLET AT NIGHT 90 tablet 3 Taking  . tamsulosin (FLOMAX) 0.4 MG CAPS capsule Take 1 capsule (0.4 mg total) by mouth daily. 90 capsule 3 Taking  . vitamin B-12 (CYANOCOBALAMIN) 1000 MCG tablet Take 1,000 mcg by mouth daily.   Taking  . Vitamins A & D (VITAMIN A & D) 10000-400 UNITS CAPS Take 1,000 mg by mouth daily.   Taking  . zoster vaccine live, PF, (ZOSTAVAX) [id number] UNT/0.65ML injection Inject 19,400 Units into the skin once. 1 each 0    Scheduled:  . [START ON 05/30/2017] aspirin  81  mg Oral Daily  . atorvastatin  40 mg Oral q1800  . [START ON 05/30/2017] clopidogrel  75 mg Oral Q breakfast  . [START ON 05/30/2017] Influenza vac split quadrivalent PF  0.5 mL Intramuscular Tomorrow-1000  . [START ON 05/30/2017] pantoprazole  40 mg Oral Daily  . sodium chloride flush  3 mL Intravenous Q12H  . [START ON 05/30/2017] tamsulosin  0.4 mg Oral Daily  . [START ON 05/30/2017] vitamin B-12  1,000 mcg Oral Daily    Assessment: Pharmacy consulted to dose and monitor heparin in this 83 year old male being treated for STEMI.  Goal of Therapy:  Heparin level 0.3-0.7 units/ml Monitor platelets by anticoagulation protocol: Yes   Plan:  No bolus per MD End. Will start heparin gtt @ 900 units/hr.Will check heparin level @ 0500.   Kalynn Declercq D 05/29/2017,8:25 PM

## 2017-05-29 NOTE — ED Provider Notes (Signed)
[facility] Emergency Department Provider Note       Time seen: ----------------------------------------- 2:44 PM on 05/29/2017 -----------------------------------------     I have reviewed the triage vital signs and the nursing notes.   HISTORY   Chief Complaint Chest Pain    HPI Raymond Rios is a 83 y.o. male with a history of COPD, hypertension and hyperlipidemia who presents to the ED for central chest pain for last 24 hours. Patient describes pain that goes across his chest and into his shoulders. Pain seemed to start last night. He has not had associated symptoms such as sweats, nausea or shortness of breath. Currently he states symptoms are very mild. EKG prehospital revealed ST elevation in leads V2 particularly.  Past Medical History:  Diagnosis Date  . Allergy   . Anemia   . Arthritis   . Depression   . GERD (gastroesophageal reflux disease)   . History of chickenpox   . History of colon polyps   . Hyperlipidemia   . Hypertension   . Myasthenia gravis   . Urine incontinence     Patient Active Problem List   Diagnosis Date Noted  . Need for shingles vaccine 02/16/2014  . Routine general medical examination at a health care facility 11/19/2013  . Hesitancy 11/19/2013  . Screening for prostate cancer 11/19/2013  . HLD (hyperlipidemia) 10/30/2013  . HTN (hypertension) 10/30/2013  . Cramp in limb 10/30/2013  . VITAMIN D DEFICIENCY 04/29/2009  . GERD 10/14/2008  . ECZEMA 10/14/2008  . ALLERGY 11/08/2007  . SHOULDER PAIN, LEFT, CHRONIC 12/05/2006  . 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 RECTAL BLEEDING, HX OF 11/15/2006  . HYPERTENSION 12/05/2001  . HYPERLIPIDEMIA 08/07/1993  . COPD 08/07/1988  . ANXIETY 08/07/1948    Past Surgical History:  Procedure Laterality Date  . CHOLECYSTECTOMY    . TONSILLECTOMY AND ADENOIDECTOMY      Allergies Sulfonamide derivatives  Social History Social History  Substance Use Topics  . Smoking  status: Former Games developermoker  . Smokeless tobacco: Not on file     Comment: quit around 1970's  . Alcohol use No    Review of Systems Constitutional: Negative for fever. Cardiovascular: Positive for chest pain Respiratory: Negative for shortness of breath. Gastrointestinal: Negative for abdominal pain, vomiting and diarrhea. Genitourinary: Negative for dysuria. Musculoskeletal: Negative for back pain. Skin: Negative for rash. Neurological: Negative for headaches, focal weakness or numbness.  All systems negative/normal/unremarkable except as stated in the HPI  ____________________________________________   PHYSICAL EXAM:  VITAL SIGNS: ED Triage Vitals [05/29/17 1441]  Enc Vitals Group     BP 130/65     Pulse Rate 63     Resp (!) 23     Temp 98.4 F (36.9 C)     Temp Source Oral     SpO2 99 %     Weight      Height      Head Circumference      Peak Flow      Pain Score      Pain Loc      Pain Edu?      Excl. in GC?     Constitutional: Alert and oriented. Well appearing and in no distress. Eyes: Conjunctivae are normal. Normal extraocular movements. ENT   Head: Normocephalic and atraumatic.   Nose: No congestion/rhinnorhea.   Mouth/Throat: Mucous membranes are moist.   Neck: No stridor. Cardiovascular: Normal rate, regular rhythm. No murmurs, rubs, or gallops. Respiratory: Normal respiratory effort without tachypnea nor  retractions. Breath sounds are clear and equal bilaterally. No wheezes/rales/rhonchi. Gastrointestinal: Soft and nontender. Normal bowel sounds Musculoskeletal: Nontender with normal range of motion in extremities. No lower extremity tenderness nor edema. Neurologic:  Normal speech and language. No gross focal neurologic deficits are appreciated.  Skin:  Skin is warm, dry and intact. No rash noted. Psychiatric: Mood and affect are normal. Speech and behavior are normal.  ____________________________________________  EKG: Interpreted by me.  ST Elevation is noted in lead V2 as well as ST elevation in aVL. Sinus rhythm is noted, PVC, possible septal infarct age indeterminate  ____________________________________________  ED COURSE:  Pertinent labs & imaging results that were available during my care of the patient were reviewed by me and considered in my medical decision making (see chart for details). Patient presents for chest pain, we will assess with labs and imaging as indicated. Patient is EKG is concerning, he will be evaluated by cardiologist to determine need for catheter.   Procedures ____________________________________________   LABS (pertinent positives/negatives)  Labs Reviewed  CBC WITH DIFFERENTIAL/PLATELET  PROTIME-INR  APTT  COMPREHENSIVE METABOLIC PANEL  TROPONIN I  LIPID PANEL    RADIOLOGY  Chest x-ray is pending  ____________________________________________  DIFFERENTIAL DIAGNOSIS   Unstable angina, STEMI, LVH, musculoskeletal pain, GERD, dissection, PE   FINAL ASSESSMENT AND PLAN  Chest pain, abnormal EKG   Plan: Patient had presented for chest pain. Patients labs are still pending at this time. Given his ST elevation, he will be taken to the Cath Lab for further evaluation. Dr. Okey Dupre is here evaluating the patient. He is receiving aspirin and heparin and will be admitted post catheterization.  Emily Filbert, MD   Note: This note was generated in part or whole with voice recognition software. Voice recognition is usually quite accurate but there are transcription errors that can and very often do occur. I apologize for any typographical errors that were not detected and corrected.     Emily Filbert, MD 05/29/17 [phone]

## 2017-05-30 ENCOUNTER — Inpatient Hospital Stay (HOSPITAL_COMMUNITY)
Admit: 2017-05-30 | Discharge: 2017-05-30 | Disposition: A | Discharge status: Home / Self Care | Payer: Medicare PPO | Attending: Internal Medicine | Admitting: Internal Medicine

## 2017-05-30 ENCOUNTER — Encounter: Payer: Self-pay | Admitting: Internal Medicine

## 2017-05-30 DIAGNOSIS — I2102 ST elevation (STEMI) myocardial infarction involving left anterior descending coronary artery: Secondary | ICD-10-CM

## 2017-05-30 DIAGNOSIS — R0602 Shortness of breath: Secondary | ICD-10-CM

## 2017-05-30 LAB — CBC
HCT: 36.9 % — ABNORMAL LOW (ref 40.0–52.0)
Hemoglobin: 12.1 g/dL — ABNORMAL LOW (ref 13.0–18.0)
MCH: 27.9 pg (ref 26.0–34.0)
MCHC: 32.7 g/dL (ref 32.0–36.0)
MCV: 85.2 fL (ref 80.0–100.0)
PLATELETS: 123 10*3/uL — AB (ref 150–440)
RBC: 4.33 MIL/uL — ABNORMAL LOW (ref 4.40–5.90)
RDW: 13 % (ref 11.5–14.5)
WBC: 14.8 10*3/uL — AB (ref 3.8–10.6)

## 2017-05-30 LAB — BASIC METABOLIC PANEL
ANION GAP: 7 (ref 5–15)
BUN: 21 mg/dL — AB (ref 6–20)
CALCIUM: 8.6 mg/dL — AB (ref 8.9–10.3)
CO2: 26 mmol/L (ref 22–32)
CREATININE: 0.99 mg/dL (ref 0.61–1.24)
Chloride: 101 mmol/L (ref 101–111)
GFR calc Af Amer: >60 mL/min (ref >60)
GLUCOSE: 120 mg/dL — AB (ref 65–99)
Potassium: 3.8 mmol/L (ref 3.5–5.1)
Sodium: 134 mmol/L — ABNORMAL LOW (ref 135–145)

## 2017-05-30 LAB — HEPARIN LEVEL (UNFRACTIONATED)
Heparin Unfractionated: 0.48 IU/mL (ref 0.30–0.70)
Heparin Unfractionated: 0.45 IU/mL (ref 0.30–0.70)

## 2017-05-30 LAB — TROPONIN I
Troponin I: 44.99 ng/mL (ref <0.03)
Troponin I: 40.51 ng/mL (ref <0.03)

## 2017-05-30 LAB — LIPID PANEL
CHOL/HDL RATIO: 1.9 ratio
Cholesterol: 89 mg/dL (ref 0–200)
HDL: 46 mg/dL (ref >40)
LDL Cholesterol: 34 mg/dL (ref 0–99)
TRIGLYCERIDES: 44 mg/dL (ref <150)
VLDL: 9 mg/dL (ref 0–40)

## 2017-05-30 LAB — ECHOCARDIOGRAM COMPLETE
HEIGHTINCHES: 70 in
WEIGHTICAEL: 2479.73 [oz_av]

## 2017-05-30 MED ORDER — DILTIAZEM LOAD VIA INFUSION
5.0000 mg | Freq: Once | INTRAVENOUS | Status: AC
  Administered 2017-05-30: 5 mg via INTRAVENOUS
  Filled 2017-05-30: qty 5

## 2017-05-30 MED ORDER — SODIUM CHLORIDE 0.9 % IV SOLN
INTRAVENOUS | Status: DC
  Administered 2017-05-31 (×2): via INTRAVENOUS

## 2017-05-30 MED ORDER — ADULT MULTIVITAMIN W/MINERALS CH
1.0000 | ORAL_TABLET | Freq: Every day | ORAL | Status: DC
  Administered 2017-05-30 – 2017-06-01 (×3): 1 via ORAL
  Filled 2017-05-30 (×3): qty 1

## 2017-05-30 MED ORDER — DILTIAZEM HCL 100 MG IV SOLR
5.0000 mg/h | INTRAVENOUS | Status: DC
  Administered 2017-05-30: 5 mg/h via INTRAVENOUS
  Filled 2017-05-30: qty 100

## 2017-05-30 MED ORDER — DIGOXIN 0.25 MG/ML IJ SOLN
0.5000 mg | Freq: Once | INTRAMUSCULAR | Status: AC
  Administered 2017-05-31: 0.5 mg via INTRAVENOUS
  Filled 2017-05-30: qty 2

## 2017-05-30 MED ORDER — ENSURE ENLIVE PO LIQD
237.0000 mL | Freq: Two times a day (BID) | ORAL | Status: DC
  Administered 2017-05-30 – 2017-06-01 (×6): 237 mL via ORAL

## 2017-05-30 MED ORDER — SODIUM CHLORIDE 0.9 % IV BOLUS (SEPSIS)
250.0000 mL | Freq: Once | INTRAVENOUS | Status: AC
  Administered 2017-05-31: 250 mL via INTRAVENOUS

## 2017-05-30 NOTE — Progress Notes (Signed)
[employer] [facility] [place] [facility]   PATIENT NAME: Raymond Rios    MR#:  [id number]  DATE OF BIRTH:  [birth year]  SUBJECTIVE:  CHIEF COMPLAINT:   Chief Complaint  Patient presents with  . Chest Pain   No chest pain or palpitation. REVIEW OF SYSTEMS:  Review of Systems  Constitutional: Negative for chills, fever and malaise/fatigue.  HENT: Negative for sore throat.   Eyes: Negative for blurred vision and double vision.  Respiratory: Negative for cough, hemoptysis, shortness of breath, wheezing and stridor.   Cardiovascular: Negative for chest pain, palpitations, orthopnea and leg swelling.  Gastrointestinal: Negative for abdominal pain, blood in stool, diarrhea, melena, nausea and vomiting.  Genitourinary: Negative for dysuria, flank pain and hematuria.  Musculoskeletal: Negative for back pain and joint pain.  Skin: Negative for rash.  Neurological: Negative for dizziness, sensory change, focal weakness, seizures, loss of consciousness, weakness and headaches.  Endo/Heme/Allergies: Negative for polydipsia.  Psychiatric/Behavioral: Negative for depression. The patient is not nervous/anxious.     DRUG ALLERGIES:   Allergies  Allergen Reactions  . Sulfonamide Derivatives     REACTION: unspecified   VITALS:  Blood pressure (!) 102/58, pulse 70, temperature 98.7 F (37.1 C), temperature source Oral, resp. rate (!) 39, height 5\' 10"  (1.778 m), weight 154 lb 15.7 oz (70.3 kg), SpO2 92 %. PHYSICAL EXAMINATION:  Physical Exam  Constitutional: He is oriented to person, place, and time and well-developed, well-nourished, and in no distress.  HENT:  Head: Normocephalic.  Mouth/Throat: Oropharynx is clear and moist.  Eyes: Pupils are equal, round, and reactive to light. Conjunctivae and EOM are normal. No scleral icterus.  Neck: Normal range of motion. Neck supple. No JVD present. No tracheal deviation present.  Cardiovascular: Normal rate, regular rhythm and normal  heart sounds.  Exam reveals no gallop.   No murmur heard. Pulmonary/Chest: Effort normal and breath sounds normal. No respiratory distress. He has no wheezes. He has no rales.  Abdominal: Soft. Bowel sounds are normal. He exhibits no distension. There is no tenderness. There is no rebound.  Musculoskeletal: Normal range of motion. He exhibits no edema or tenderness.  Neurological: He is alert and oriented to person, place, and time. No cranial nerve deficit.  Skin: No rash noted. No erythema.  Psychiatric: Affect normal.   LABORATORY PANEL:  Male CBC  Recent Labs Lab 05/30/17 0827  WBC 14.8*  HGB 12.1*  HCT 36.9*  PLT 123*   ------------------------------------------------------------------------------------------------------------------ Chemistries   Recent Labs Lab 05/29/17 1443 05/30/17 0827  NA 138 134*  K 4.7 3.8  CL 101 101  CO2 27 26  GLUCOSE 137* 120*  BUN 27* 21*  CREATININE 0.96 0.99  CALCIUM 9.5 8.6*  AST 339*  --   ALT 57  --   ALKPHOS 39  --   BILITOT 0.9  --    RADIOLOGY:  No results found. ASSESSMENT AND PLAN:   * STEMI   Complete occlusion of proximal LAD   No interventions done.   Medical management.   ASA, Plavix, Atorvastatin.   Echo. Per cardiologist, dual antiplatelet therapy with aspirin and clopidogrel for up to 12 months, as tolerated. Consider adding carvedilol 3.125 mg twice daily with careful up titration as heart rate and blood pressure allow. If blood pressure and renal function allow, begin ACE inhibitor or ARB today.  * Hyperlipidemia   Atorvastatin.  * Hypertension   Monitor, may add betablocker later.  All the records are reviewed and case  discussed with Care Management/Social Worker. Management plans discussed with the patient, family and they are in agreement.  CODE STATUS: Full Code  TOTAL TIME TAKING CARE OF THIS PATIENT: 36 minutes.   More than 50% of the time was spent in counseling/coordination of care:  YES  POSSIBLE D/C IN 2 DAYS, DEPENDING ON CLINICAL CONDITION.   Shaune Pollack M.D on 05/30/2017 at 10:53 AM  Between 7am to 6pm - Pager - [phone]  After 6pm go to [email or web address] - Therapist, nutritional Hospitalists

## 2017-05-30 NOTE — Progress Notes (Signed)
*  PRELIMINARY RESULTS* Echocardiogram 2D Echocardiogram has been performed.  Cristela BlueHege, Luka Stohr 05/30/2017, 2:31 PM

## 2017-05-30 NOTE — Progress Notes (Signed)
ANTICOAGULATION CONSULT NOTE - Initial Consult  Pharmacy Consult for Heparin  Indication: chest pain/ACS/STEMI  Allergies  Allergen Reactions  . Sulfonamide Derivatives     REACTION: unspecified    Patient Measurements: Height: 5\' 10"  (177.8 cm) Weight: 154 lb 15.7 oz (70.3 kg) IBW/kg (Calculated) : 73 Heparin Dosing Weight: 70  Vital Signs: Temp: 98.7 F (37.1 C) (10/24 0700) Temp Source: Oral (10/24 0700) BP: 95/61 (10/24 1400) Pulse Rate: 77 (10/24 1400)  Labs:  Recent Labs  05/29/17 1443 05/29/17 2043 05/30/17 0250 05/30/17 0452 05/30/17 0827 05/30/17 1256  HGB 12.6*  --   --   --  12.1*  --   HCT 38.6*  --   --   --  36.9*  --   PLT 153  --   --   --  123*  --   APTT 24  --   --   --   --   --   LABPROT 13.5  --   --   --   --   --   INR 1.04  --   --   --   --   --   HEPARINUNFRC  --   --   --  0.48  --  0.45  CREATININE 0.96  --   --   --  0.99  --   TROPONINI 53.64* 45.69* 44.99*  --  40.51*  --     Estimated Creatinine Clearance: 55.2 mL/min (by C-G formula based on SCr of 0.99 mg/dL).   Medical History: Past Medical History:  Diagnosis Date  . Allergy   . Anemia   . Arthritis   . Depression   . GERD (gastroesophageal reflux disease)   . History of chickenpox   . History of colon polyps   . Hyperlipidemia   . Hypertension   . Myasthenia gravis (HCC)   . Urine incontinence     Medications:  Prescriptions Prior to Admission  Medication Sig Dispense Refill Last Dose  . atenolol (TENORMIN) 25 MG tablet TAKE 1 TABLET EVERY NIGHT 90 tablet 2 Taking  . hydrocortisone (ANUSOL-HC) 25 MG suppository 1 suppository rectally at bedtime as needed. 15 suppository 3   . pantoprazole (PROTONIX) 40 MG tablet TAKE 1 TABLET DAILY 90 tablet 2 Taking  . simvastatin (ZOCOR) 40 MG tablet TAKE 1 TABLET AT NIGHT 90 tablet 3 Taking  . tamsulosin (FLOMAX) 0.4 MG CAPS capsule Take 1 capsule (0.4 mg total) by mouth daily. 90 capsule 3 Taking  . vitamin B-12  (CYANOCOBALAMIN) 1000 MCG tablet Take 1,000 mcg by mouth daily.   Taking  . Vitamins A & D (VITAMIN A & D) 10000-400 UNITS CAPS Take 1,000 mg by mouth daily.   Taking  . zoster vaccine live, PF, (ZOSTAVAX) 29562 UNT/0.65ML injection Inject 19,400 Units into the skin once. 1 each 0    Scheduled:  . aspirin  81 mg Oral Daily  . atorvastatin  40 mg Oral q1800  . carvedilol  3.125 mg Oral BID WC  . clopidogrel  75 mg Oral Q breakfast  . feeding supplement (ENSURE ENLIVE)  237 mL Oral BID BM  . multivitamin with minerals  1 tablet Oral Daily  . pantoprazole  40 mg Oral Daily  . sodium chloride flush  3 mL Intravenous Q12H  . tamsulosin  0.4 mg Oral Daily  . vitamin B-12  1,000 mcg Oral Daily    Assessment: Pharmacy consulted to dose and monitor heparin in this 83 year old male being  treated for STEMI. Patient is currently being medically management. Heparin is infusing at 900 units/hr.   Goal of Therapy:  Heparin level 0.3-0.7 units/ml Monitor platelets by anticoagulation protocol: Yes   Plan:  Will continue at 900 units/hr. Will obtain follow up level with am labs.   Pharmacy will continue to monitor and adjust per consult.   MLS 05/30/2017

## 2017-05-30 NOTE — Progress Notes (Signed)
Initial Nutrition Assessment  DOCUMENTATION CODES:   Non-severe (moderate) malnutrition in context of social or environmental circumstances  INTERVENTION:  Recommend liberalizing diet to regular.  Encouraged adequate intake of well-balanced meals throughout the day that contain adequate calories and protein. Discussed options that are easy to patient to prepare.  Provide Ensure Enlive po BID, each supplement provides 350 kcal and 20 grams of protein.  Provide multivitamin with minerals daily.  NUTRITION DIAGNOSIS:   Malnutrition (Moderate) related to social / environmental circumstances (depression, stress, decreased appetite and limited ability to cook at home) as evidenced by moderate depletion of body fat, mild depletion of muscle mass.  GOAL:   Patient will meet greater than or equal to 90% of their needs  MONITOR:   PO intake, Supplement acceptance, Weight trends, Labs, I & O's  REASON FOR ASSESSMENT:   Malnutrition Screening Tool    ASSESSMENT:   83 year old male with PMHx of anemia, arthritis, depression, GERD, HTN, HLD, myasthenia gravis who is admitted following anterior wall MI and underwent left heart catheterization which found total occlusion of proximal LAD and heavy thrombus burden.   -Patient being medically managed in setting of minimal chest pain and as he is hemodynamically stable.  Spoke with patient and his daughter at bedside. Patient reports he has had a poor appetite for a while now, but he is not sure exactly how long. It is related to depression and stress in his life. He has not felt like cooking or eating well at home. His son has him signed up for meals on wheels. He receives 5 meals each week. He reports he eats the meat if it is cooked well, but if not he will only eat the sides. His other meal of the day is usually cereal in milk. Patient also drinks approximately 2 cups of 2% milk daily. He also makes sandwiches occasionally with meat brought  over by family.  Patient reports his UBW is 183 lbs. He has lost approximately 28 lbs (15.3% body weight) over >1 year, which is not significant for time frame.  Medications reviewed and include: Coreg, pantoprazole, vitamin B12 1000 micrograms daily.  Labs reviewed: Sodium 134, BUN 21, elevated Troponin.  Nutrition-Focused physical exam completed. Findings are mild-moderate fat depletion (moderate depletion of orbital region and upper arm region; mild depletion of thoracic/lumbar region), mild muscle depletion (mild depletion of temple region, clavicle bone region, clavicle/acromino bone region, scapular bone region, dorsal hand), and no edema.   Discussed with RN. Plan is to liberalize diet.  Diet Order:  Diet Heart Room service appropriate? Yes; Fluid consistency: Thin  Skin:  Reviewed, no issues  Last BM:  05/29/2017  Height:   Ht Readings from Last 1 Encounters:  05/29/17 5\' 10"  (1.778 m)    Weight:   Wt Readings from Last 1 Encounters:  05/29/17 154 lb 15.7 oz (70.3 kg)    Ideal Body Weight:  75.5 kg  BMI:  Body mass index is 22.24 kg/m.  Estimated Nutritional Needs:   Kcal:  [id number] (MSJ x 1.2-1.4)  Protein:  70-85 grams (1-1.2 grams/kg)  Fluid:  1.7 L/day (25 ml/kg)  EDUCATION NEEDS:   Education needs addressed  Helane RimaLeanne Julie Paolini, MS, RD, LDN Office: [phone] Pager: [phone] After Hours/Weekend Pager: [phone]

## 2017-05-30 NOTE — Progress Notes (Signed)
ANTICOAGULATION CONSULT NOTE - Initial Consult  Pharmacy Consult for Heparin  Indication: chest pain/ACS/STEMI  Allergies  Allergen Reactions  . Sulfonamide Derivatives     REACTION: unspecified    Patient Measurements: Height: 5\' 10"  (177.8 cm) Weight: 154 lb 15.7 oz (70.3 kg) IBW/kg (Calculated) : 73 Heparin Dosing Weight: 70  Vital Signs: Temp: 99.2 F (37.3 C) (10/24 0100) Temp Source: Oral (10/24 0100) BP: 109/59 (10/24 0200) Pulse Rate: 65 (10/24 0200)  Labs:  Recent Labs  05/29/17 1443 05/29/17 2043 05/30/17 0250 05/30/17 0452  HGB 12.6*  --   --   --   HCT 38.6*  --   --   --   PLT 153  --   --   --   APTT 24  --   --   --   LABPROT 13.5  --   --   --   INR 1.04  --   --   --   HEPARINUNFRC  --   --   --  0.48  CREATININE 0.96  --   --   --   TROPONINI 53.64* 45.69* 44.99*  --     Estimated Creatinine Clearance: 57 mL/min (by C-G formula based on SCr of 0.96 mg/dL).   Medical History: Past Medical History:  Diagnosis Date  . Allergy   . Anemia   . Arthritis   . Depression   . GERD (gastroesophageal reflux disease)   . History of chickenpox   . History of colon polyps   . Hyperlipidemia   . Hypertension   . Myasthenia gravis (HCC)   . Urine incontinence     Medications:  Prescriptions Prior to Admission  Medication Sig Dispense Refill Last Dose  . atenolol (TENORMIN) 25 MG tablet TAKE 1 TABLET EVERY NIGHT 90 tablet 2 Taking  . hydrocortisone (ANUSOL-HC) 25 MG suppository 1 suppository rectally at bedtime as needed. 15 suppository 3   . pantoprazole (PROTONIX) 40 MG tablet TAKE 1 TABLET DAILY 90 tablet 2 Taking  . simvastatin (ZOCOR) 40 MG tablet TAKE 1 TABLET AT NIGHT 90 tablet 3 Taking  . tamsulosin (FLOMAX) 0.4 MG CAPS capsule Take 1 capsule (0.4 mg total) by mouth daily. 90 capsule 3 Taking  . vitamin B-12 (CYANOCOBALAMIN) 1000 MCG tablet Take 1,000 mcg by mouth daily.   Taking  . Vitamins A & D (VITAMIN A & D) 10000-400 UNITS CAPS Take  1,000 mg by mouth daily.   Taking  . zoster vaccine live, PF, (ZOSTAVAX) 16109 UNT/0.65ML injection Inject 19,400 Units into the skin once. 1 each 0    Scheduled:  . aspirin  81 mg Oral Daily  . atorvastatin  40 mg Oral q1800  . carvedilol  3.125 mg Oral BID WC  . clopidogrel  75 mg Oral Q breakfast  . Influenza vac split quadrivalent PF  0.5 mL Intramuscular Tomorrow-1000  . pantoprazole  40 mg Oral Daily  . sodium chloride flush  3 mL Intravenous Q12H  . tamsulosin  0.4 mg Oral Daily  . vitamin B-12  1,000 mcg Oral Daily    Assessment: Pharmacy consulted to dose and monitor heparin in this 83 year old male being treated for STEMI.  Goal of Therapy:  Heparin level 0.3-0.7 units/ml Monitor platelets by anticoagulation protocol: Yes   Plan:  No bolus per MD End. Will start heparin gtt @ 900 units/hr.Will check heparin level @ 0500.   10/24 @ 0500 HL 0.48 therapeutic. Will continue current rate and will recheck HL @  1300. trops trending down.  Thomasene Rippleavid Arlyce Circle, PharmD, BCPS Clinical Pharmacist 05/30/2017

## 2017-05-30 NOTE — Progress Notes (Signed)
Progress Note  Patient Name: Raymond Rios Date of Encounter: 05/30/2017  Primary Cardiologist: New to [facility] - consult by End  Subjective   Late presenting anterior STEMI. No intervention performed. No complaints this morning. Frequent PVCs noted on telemetry with NSVT. Renal function stable. Mild leukocytosis of 14.7, HGB stable. Troponin peaked at 53.64 (this was his initial troponin). Echo pending. BP on the soft side in the 90s systolic.   Inpatient Medications    Scheduled Meds: . aspirin  81 mg Oral Daily  . atorvastatin  40 mg Oral q1800  . carvedilol  3.125 mg Oral BID WC  . clopidogrel  75 mg Oral Q breakfast  . feeding supplement (ENSURE ENLIVE)  237 mL Oral BID BM  . multivitamin with minerals  1 tablet Oral Daily  . pantoprazole  40 mg Oral Daily  . sodium chloride flush  3 mL Intravenous Q12H  . tamsulosin  0.4 mg Oral Daily  . vitamin B-12  1,000 mcg Oral Daily   Continuous Infusions: . sodium chloride 250 mL (05/29/17 1800)  . heparin 900 Units/hr (05/29/17 2048)   PRN Meds: sodium chloride, acetaminophen, docusate sodium, ondansetron (ZOFRAN) IV, sodium chloride flush   Vital Signs    Vitals:   05/30/17 0900 05/30/17 1000 05/30/17 1100 05/30/17 1200  BP: 91/69 (!) 102/58 (!) 105/55 124/86  Pulse: 70 70 73 81  Resp: (!) 25 (!) 39 (!) 28 (!) 31  Temp:      TempSrc:      SpO2: 91% 92% 93% (!) 83%  Weight:      Height:        Intake/Output Summary (Last 24 hours) at 05/30/17 1336 Last data filed at 05/30/17 1200  Gross per 24 hour  Intake           335.97 ml  Output                0 ml  Net           335.97 ml   Filed Weights   05/29/17 1453 05/29/17 1605  Weight: 154 lb (69.9 kg) 154 lb 15.7 oz (70.3 kg)    Telemetry    NSR, frequent PVCs, NSVT - Personally Reviewed  ECG    n/a - Personally Reviewed  Physical Exam   GEN: Frail appearing.   Neck: No JVD. Cardiac: RRR, no murmurs, rubs, or gallops.  Respiratory: Clear to auscultation  bilaterally.  GI: Soft, nontender, non-distended.   MS: No edema; No deformity. Neuro:  Alert and oriented x 3; Nonfocal.  Psych: Normal affect.  Labs    Chemistry Recent Labs Lab 05/29/17 1443 05/30/17 0827  NA 138 134*  K 4.7 3.8  CL 101 101  CO2 27 26  GLUCOSE 137* 120*  BUN 27* 21*  CREATININE 0.96 0.99  CALCIUM 9.5 8.6*  PROT 7.4  --   ALBUMIN 4.1  --   AST 339*  --   ALT 57  --   ALKPHOS 39  --   BILITOT 0.9  --   GFRNONAA >60 >60  GFRAA >60 >60  ANIONGAP 10 7     Hematology Recent Labs Lab 05/29/17 1443 05/30/17 0827  WBC 11.7* 14.8*  RBC 4.51 4.33*  HGB 12.6* 12.1*  HCT 38.6* 36.9*  MCV 85.8 85.2  MCH 27.9 27.9  MCHC 32.5 32.7  RDW 12.9 13.0  PLT 153 123*    Cardiac Enzymes Recent Labs Lab 05/29/17 1443 05/29/17 2043 05/30/17 0250  05/30/17 0827  TROPONINI 53.64* 45.69* 44.99* 40.51*   No results for input(s): TROPIPOC in the last 168 hours.   BNPNo results for input(s): BNP, PROBNP in the last 168 hours.   DDimer No results for input(s): DDIMER in the last 168 hours.   Radiology    No results found.  Cardiac Studies   LHC 05/29/2017: Coronary Findings   Dominance: Left  Left Main  Vessel is large.  Left Anterior Descending  Vessel is large.  Ost LAD to Prox LAD lesion, 40% stenosed.  Mid LAD lesion, 100% stenosed.  Left Circumflex  Vessel is large.  First Obtuse Marginal Branch  Vessel is small in size.  Second Obtuse Marginal Branch  Vessel is small in size.  Third Obtuse Marginal Branch  Vessel is moderate in size.  3rd Mrg lesion, 50% stenosed. The lesion is ulcerative.  Fourth Obtuse Marginal Branch  Vessel is small in size.  Left Posterior Descending Artery  Vessel is moderate in size.  LPDA lesion, 80% stenosed.  Right Coronary Artery  Vessel is small. Vessel is angiographically normal.  Wall Motion              Left Heart   Left Ventricle There is moderate to severe left ventricular systolic  dysfunction. LVEF ~35%. LV end diastolic pressure is moderately elevated. There are LV function abnormalities due to segmental dysfunction. There is no evidence of mitral regurgitation.    Aortic Valve There is no aortic valve stenosis.    Coronary Diagrams   Diagnostic Diagram        Conclusions: 1. Thrombotic total occlusion of proximal LAD. 2. Dominant LCx with 50% OM3 stenosis and 80% lPDA lesion. 3. Anterior and apical akinesis with LVEF ~35%. 4. Moderately elevated left ventricular filling pressure.  Recommendations: 1. Given onset of symptoms > 24 hours ago, anteroseptal Q-waves on EKG, hemodynamic/electrical stability, and resolution of chest pain, I recommend medical therapy. 2. Dual antiplatelet therapy with aspirin and clopidogrel for up to 12 months, as tolerated. 3. If no evidence of bleeding from cath site, consider starting heparin infusion in 4 hours. 4. Aggressive secondary prevention, including high-intensity statin therapy. 5. Transthoracic echo. 6. Hold home atenolol; start carvedilol as heart rate tolerates. 7. Consider adding ACEI/ARB tomorrow as BP and renal function allow. 8. Consider gentle diuresis, given reduced LVEF and elevated LVEDP.   TTE pending.   Patient Profile     83 y.o. male with history of hypertension, hyperlipidemia, COPD, and myasthenia gravis who was admitted on 10/23 with a late-presenting anterior STEMI.   Assessment & Plan    1. Late presenting anterior STEMI: -Occlusion of the proximal LAD for > 24 hours with initial troponin in the ED of 54 -Minimal chest pain since arriving to the ED -Urgent LHC on 10/23 as above with medical management advised -Currently chest pain free -Continue DAPT with ASA and Plavix for 12 months -Continue heparin for a total of 48 hours -Cardiac rehab -Continued medical management of LCx disease unless he becomes unstable or has recurrence of chest pain -Lipitor  2. ICM: -He does not appear  volume up -Echo pending -Continue low-dose Coreg, BP precludes titration at this time -Unable to add ACEi/ARB/ARNI/spiro 2/2 soft BP, start when/if able -Gentle diuresis as needed -Consider LifeVest once echo is formally read. This has been discussed with Drs. Gollan and End  3. Frequent PVCs/NSVT: -Asymptomatic -He is at high-risk for ventricular arrhythmia given his anterior MI with anterior AK -Consider LifeVest as above  For questions or updates, please contact [facility] Please consult [email or web address] for contact info under Cardiology/STEMI.    Signed, Eula Listenyan Dunn, PA-C [facility] Pager: [phone] 05/30/2017, 1:36 PM   Attending Note Patient seen and examined, agree with detailed note above,  Patient presentation and plan discussed on rounds.   No significant events overnight He does not think it chest pain Mild shortness of breath, is on nasal cannula oxygen  Has not been out of bed , still in the intensive care unit   Cardiac catheterization results discussed with him in detail, occluded LAD  Echocardiogram being performed in the room during my visit Pictures reviewed in detail, severe anterior wall hypokinesis/ akinesis extending to the apical region Prelim  ejection fraction likely 30-35%  On physical exam clear lung sounds with rales at the bases, Unable to estimate JVD, heart sounds regular with normal S1-S2 no murmurs appreciated, abdomen soft nontender, no significant lower extremity edema  Lab work reviewed showing creatinine 0.99, potassium 3.8, troponin of 40, total cholesterol 89, hematocrit 37  A/P: 1) STEMI, late presenting Occluded LAD, no intervention performed High risk of arrhythmia and sudden death given large territory of akinesis anterior wall Need to consider LifeVest at discharge On aspirin, Plavix, statin, beta-blocker, Will add ACE inhibitor at a later date as well as Aldactone once blood pressure improves  2) ischemic  cardiomyopathy Prelim ejection fraction 30-35% Large region of anterior wall akinesis from occluded LAD   Greater than 50% was spent in counseling and coordination of care with patient Total encounter time 35 minutes or more   Signed: Dossie Arbourim Gollan  M.D., Ph.D. [facility]

## 2017-05-30 NOTE — Progress Notes (Signed)
05/30/2017 2000 Patient is a trasnfer from CCU  post cardiach cath  via right radial and right groin. On admission patient was alert and oriented X4, denied being in pain on any discomfort. Patient was oriented to his room and assisted as needed.    05/30/2017 2330  Dr. Katheren ShamsSalary was paged of 15 beats of Vtach and patient's being in afib in the 140-160. Dr. Katheren ShamsSalary said he will place an order for Diltiazem infusion.  Diltiazem infusion started per order. Dr. Katheren ShamsSalary was notified of SBP in the 90s,  Patient was asymptomatic. Per Dr. Katheren ShamsSalary, 5mg  of 1 time Diltiazem was administered. Patient SBP dropped to 70s. IV Diltiazem was stopped, 250mL normal saline was infused over 1 hour, and 1425mL/h continuous normal saline infusion was started per order.  0.5mg  of digoxin was administered twice. Patient remained in afib overnight with HR in 100-120. Dr. Katheren ShamsSalary notified of patient status. No new order received. Will continue to monitor.   05/31/2017 Patient HR is now in the 90s afib.

## 2017-05-31 ENCOUNTER — Encounter: Payer: Self-pay | Admitting: Physician Assistant

## 2017-05-31 DIAGNOSIS — R410 Disorientation, unspecified: Secondary | ICD-10-CM

## 2017-05-31 LAB — BASIC METABOLIC PANEL
Anion gap: 6 (ref 5–15)
BUN: 22 mg/dL — AB (ref 6–20)
CALCIUM: 8.3 mg/dL — AB (ref 8.9–10.3)
CO2: 26 mmol/L (ref 22–32)
Chloride: 102 mmol/L (ref 101–111)
Creatinine, Ser: 0.97 mg/dL (ref 0.61–1.24)
GFR calc Af Amer: >60 mL/min (ref >60)
GLUCOSE: 105 mg/dL — AB (ref 65–99)
Potassium: 3.9 mmol/L (ref 3.5–5.1)
Sodium: 134 mmol/L — ABNORMAL LOW (ref 135–145)

## 2017-05-31 LAB — HEPARIN LEVEL (UNFRACTIONATED): Heparin Unfractionated: 0.28 IU/mL — ABNORMAL LOW (ref 0.30–0.70)

## 2017-05-31 LAB — TSH: TSH: 1.311 u[IU]/mL (ref 0.350–4.500)

## 2017-05-31 LAB — MAGNESIUM: Magnesium: 1.9 mg/dL (ref 1.7–2.4)

## 2017-05-31 MED ORDER — HEPARIN BOLUS VIA INFUSION
1050.0000 [IU] | Freq: Once | INTRAVENOUS | Status: AC
  Administered 2017-05-31: 1050 [IU] via INTRAVENOUS
  Filled 2017-05-31: qty 1050

## 2017-05-31 MED ORDER — AMIODARONE HCL 200 MG PO TABS
400.0000 mg | ORAL_TABLET | Freq: Two times a day (BID) | ORAL | Status: DC
  Administered 2017-05-31 – 2017-06-01 (×3): 400 mg via ORAL
  Filled 2017-05-31 (×3): qty 2

## 2017-05-31 MED ORDER — DIGOXIN 0.25 MG/ML IJ SOLN
0.5000 mg | Freq: Once | INTRAMUSCULAR | Status: AC
  Administered 2017-05-31: 0.5 mg via INTRAVENOUS
  Filled 2017-05-31: qty 2

## 2017-05-31 NOTE — Progress Notes (Addendum)
Progress Note  Patient Name: Raymond Rios Date of Encounter: 05/31/2017  Primary Cardiologist: New to [facility] - consult by End  Subjective   Late presenting anterior STEMI. No intervention performed. Went into new onset Afib with RVR into the 170s around 10 PM. Was started on IV diltiazem gtt with drop in BP to the 70s systolic leading to stopping of diltiazem gtt and administration of IV diltiazem 5 mg x 1 and IV digoxin x 2. He has since converted to sinus rhythm with heart rates in the 70s bpm. Echo showed EF 30-35%, akinesis of the mid-apicalanteroseptal, anterior, anterolateral, and apical myocardium. No labs this morning. Remains on heparin gtt. Confused at times.   Inpatient Medications    Scheduled Meds: . aspirin  81 mg Oral Daily  . atorvastatin  40 mg Oral q1800  . carvedilol  3.125 mg Oral BID WC  . clopidogrel  75 mg Oral Q breakfast  . feeding supplement (ENSURE ENLIVE)  237 mL Oral BID BM  . multivitamin with minerals  1 tablet Oral Daily  . pantoprazole  40 mg Oral Daily  . sodium chloride flush  3 mL Intravenous Q12H  . tamsulosin  0.4 mg Oral Daily  . vitamin B-12  1,000 mcg Oral Daily   Continuous Infusions: . sodium chloride 250 mL (05/29/17 1800)  . sodium chloride 125 mL/hr at 05/31/17 0606  . diltiazem (CARDIZEM) infusion Stopped (05/30/17 2345)  . heparin 1,050 Units/hr (05/31/17 0605)   PRN Meds: sodium chloride, acetaminophen, docusate sodium, ondansetron (ZOFRAN) IV, sodium chloride flush   Vital Signs    Vitals:   05/31/17 0201 05/31/17 0300 05/31/17 0400 05/31/17 0842  BP: (!) 89/56 (!) 87/51 (!) 100/49 (!) 99/53  Pulse: (!) 123 (!) 109 (!) 128 79  Resp:    18  Temp:   98.2 F (36.8 C) 98.1 F (36.7 C)  TempSrc:    Oral  SpO2:   96% 96%  Weight:      Height:        Intake/Output Summary (Last 24 hours) at 05/31/17 0843 Last data filed at 05/31/17 0400  Gross per 24 hour  Intake          1311.25 ml  Output              750 ml  Net            561.25 ml   Filed Weights   05/29/17 1453 05/29/17 1605  Weight: 154 lb (69.9 kg) 154 lb 15.7 oz (70.3 kg)    Telemetry    NSR, frequent PVCs, NSVT - Personally Reviewed  ECG    n/a - Personally Reviewed  Physical Exam   GEN: Frail appearing.   Neck: No JVD. Cardiac: RRR, no murmurs, rubs, or gallops.  Respiratory: Clear to auscultation bilaterally.  GI: Soft, nontender, non-distended.   MS: No edema; No deformity. Neuro:  Alert; Nonfocal.  Psych: Normal affect.  Labs    Chemistry  Recent Labs Lab 05/29/17 1443 05/30/17 0827  NA 138 134*  K 4.7 3.8  CL 101 101  CO2 27 26  GLUCOSE 137* 120*  BUN 27* 21*  CREATININE 0.96 0.99  CALCIUM 9.5 8.6*  PROT 7.4  --   ALBUMIN 4.1  --   AST 339*  --   ALT 57  --   ALKPHOS 39  --   BILITOT 0.9  --   GFRNONAA >60 >60  GFRAA >60 >60  ANIONGAP 10 7  Hematology  Recent Labs Lab 05/29/17 1443 05/30/17 0827  WBC 11.7* 14.8*  RBC 4.51 4.33*  HGB 12.6* 12.1*  HCT 38.6* 36.9*  MCV 85.8 85.2  MCH 27.9 27.9  MCHC 32.5 32.7  RDW 12.9 13.0  PLT 153 123*    Cardiac Enzymes  Recent Labs Lab 05/29/17 1443 05/29/17 2043 05/30/17 0250 05/30/17 0827  TROPONINI 53.64* 45.69* 44.99* 40.51*   No results for input(s): TROPIPOC in the last 168 hours.   BNPNo results for input(s): BNP, PROBNP in the last 168 hours.   DDimer No results for input(s): DDIMER in the last 168 hours.   Radiology    No results found.  Cardiac Studies   LHC 05/29/2017: Coronary Findings   Dominance: Left  Left Main  Vessel is large.  Left Anterior Descending  Vessel is large.  Ost LAD to Prox LAD lesion, 40% stenosed.  Mid LAD lesion, 100% stenosed.  Left Circumflex  Vessel is large.  First Obtuse Marginal Branch  Vessel is small in size.  Second Obtuse Marginal Branch  Vessel is small in size.  Third Obtuse Marginal Branch  Vessel is moderate in size.  3rd Mrg lesion, 50% stenosed. The lesion is ulcerative.    Fourth Obtuse Marginal Branch  Vessel is small in size.  Left Posterior Descending Artery  Vessel is moderate in size.  LPDA lesion, 80% stenosed.  Right Coronary Artery  Vessel is small. Vessel is angiographically normal.  Wall Motion              Left Heart   Left Ventricle There is moderate to severe left ventricular systolic dysfunction. LVEF ~35%. LV end diastolic pressure is moderately elevated. There are LV function abnormalities due to segmental dysfunction. There is no evidence of mitral regurgitation.    Aortic Valve There is no aortic valve stenosis.    Coronary Diagrams   Diagnostic Diagram        Conclusions: 1. Thrombotic total occlusion of proximal LAD. 2. Dominant LCx with 50% OM3 stenosis and 80% lPDA lesion. 3. Anterior and apical akinesis with LVEF ~35%. 4. Moderately elevated left ventricular filling pressure.  Recommendations: 1. Given onset of symptoms > 24 hours ago, anteroseptal Q-waves on EKG, hemodynamic/electrical stability, and resolution of chest pain, I recommend medical therapy. 2. Dual antiplatelet therapy with aspirin and clopidogrel for up to 12 months, as tolerated. 3. If no evidence of bleeding from cath site, consider starting heparin infusion in 4 hours. 4. Aggressive secondary prevention, including high-intensity statin therapy. 5. Transthoracic echo. 6. Hold home atenolol; start carvedilol as heart rate tolerates. 7. Consider adding ACEI/ARB tomorrow as BP and renal function allow. 8. Consider gentle diuresis, given reduced LVEF and elevated LVEDP.   TTE 05/30/2017:  Study Conclusions  - Procedure narrative: Transthoracic echocardiography. The study   was technically difficult. - Left ventricle: There was mild focal basal hypertrophy of the   septum. Systolic function was moderately to severely reduced. The   estimated ejection fraction was in the range of 30% to 35%. There   is akinesis of the mid-apicalanteroseptal,  anterior,   anterolateral, and apical myocardium. Doppler parameters are   consistent with abnormal left ventricular relaxation (grade 1   diastolic dysfunction). - Aortic valve: Mildly calcified annulus. Trileaflet. - Left atrium: The atrium was mildly dilated.  Patient Profile     83 y.o. male with history of hypertension, hyperlipidemia, COPD, and myasthenia gravis who was admitted on 10/23 with a late-presenting anterior STEMI.   Assessment &  Plan    1. Late presenting anterior STEMI: -Occlusion of the proximal LAD for > 24 hours with initial troponin in the ED of 54 -Minimal chest pain since arriving to the ED -Urgent LHC on 10/23 as above with medical management advised -Currently chest pain free -Continue DAPT with ASA and Plavix for 12 months -Heparin discontinued  -Cardiac rehab -Continued medical management of LCx disease unless he becomes unstable or has recurrence of chest pain -Lipitor  2. ICM: -He does not appear volume up -Echo as above -Continue low-dose Coreg, BP precludes titration at this time -Unable to add ACEi/ARB/ARNI/spiro 2/2 soft BP, start when/if able -Gentle diuresis as needed -He is at high risk of sudden death given his CM and anterior AK. Ideally we would like a LifeVest followed by ICD as his EF is unlikely is significantly improve. There are some concerns regarding dementia/confusion though. Need to discuss with family  3. Frequent PVCs/NSVT: -Asymptomatic -He is at high-risk for ventricular arrhythmia given his anterior MI with anterior AK -Consider LifeVest as above, though it remains uncertain if he would be a candidate given his dementia/confusion -Add amiodarone given high risk of arrhythmia and sudden death -May need to discuss with family if they would like a Life Vest  4. New onset Afib with RVR: -Now in NSR -Check lytes and TSH with recommendation to replete abnormalities  -Given his need for DAPT in the setting of his medically  managed anterior STEMI he is unlikely to be a good candidate for triple therapy -CHADS2VASc at least 5 (CHF, HTN, age x 2, vascular disease) -Would attempt to avoid CCB long term given CM -Coreg as BP allows for rate control -Amiodarone loading per MD   For questions or updates, please contact [facility] Please consult [email or web address] for contact info under Cardiology/STEMI.    Signed, Eula Listen, PA-C [facility] Pager: [phone] 05/31/2017, 8:43 AM   Attending Note Patient seen and examined, agree with detailed note above,  Patient presentation and plan discussed on rounds.   On rounds this morning, he reported feeling okay, denies any chest pain Confusion about where he was, confusion about his medications Reports that he lives alone Feels he is getting weaker, especially his legs Confused as to what happened.  Does not seem to remember having a heart attack  He was on heparin infusion this morning with IV fluids 125 cc/h We recommended to the nurse that they Hep-Lock his IV  Echocardiogram reviewed with him, large region of anterior wall akinesis, ejection fraction 30-35%  On physical exam lungs with rales at the bases otherwise clear, unable to estimate JVP, heart sounds regular with normal S1-S2 no murmurs appreciated, abdomen soft nontender, no significant lower extremity edema  Lab work reviewed showing creatinine 0.97, BUN 22, TSH 1.3  A/P: 1) STEMI, late presenting Occluded LAD, no intervention performed High risk of arrhythmia and sudden death given large territory of akinesis anterior wall Would consider LifeVest though he is very confused and lives alone If he does go to rehab, this may be a consideration as they could help There is no family at the bedside On aspirin, Plavix, statin, beta-blocker, Blood pressure running low Will add ACE inhibitor at a later date as well as Aldactone once blood pressure improves  2) ischemic  cardiomyopathy ejection fraction 30-35% Large region of anterior wall akinesis from occluded LAD Results discussed with him in detail, high risk of Arrhythmia and sudden death Would consider outpatient cardiac rehab that would likely  need family assistance  3) dementia Appears to have poor mental status, may benefit from outpatient social worker Would likely need family assistance in recovery  He had numerous questions, we tried to answer all of his questions in detail He was very confused as to where he was, what it happened Greater than 50% was spent in counseling and coordination of care with patient Total encounter time 35 minutes or more   Signed: Dossie Arbourim Cynthie Garmon  M.D., Ph.D. [facility]

## 2017-05-31 NOTE — Progress Notes (Signed)
[employer] - [place] [facility]   PATIENT NAME: Raymond Rios    MR#:  [id number]  DATE OF BIRTH:  07-30-1931  SUBJECTIVE:   Patient here due to chest pain and noted to have a large anterior wall ST elevation MI. Status post cardiac catheterization which showed a completely occluded proximal LAD lesion and therefore it was not intervened on. Currently being managed medically. Overnight patient went into atrial fibrillation with rapid ventricular response which responded well to a Cardizem drip. He has not converted to a normal sinus rhythm. Remains weak and confused at times.  REVIEW OF SYSTEMS:    Review of Systems  Constitutional: Negative for chills and fever.  HENT: Negative for congestion and tinnitus.   Eyes: Negative for blurred vision and double vision.  Respiratory: Negative for cough, shortness of breath and wheezing.   Cardiovascular: Negative for chest pain, orthopnea and PND.  Gastrointestinal: Negative for abdominal pain, diarrhea, nausea and vomiting.  Genitourinary: Negative for dysuria and hematuria.  Neurological: Positive for weakness. Negative for dizziness, sensory change and focal weakness.  All other systems reviewed and are negative.   Nutrition: Heart healthy Tolerating Diet: Yes Tolerating PT: Await evaluation  DRUG ALLERGIES:   Allergies  Allergen Reactions  . Sulfonamide Derivatives     REACTION: unspecified    VITALS:  Blood pressure (!) 99/51, pulse 72, temperature 98.1 F (36.7 C), temperature source Oral, resp. rate 18, height 5\' 10"  (1.778 m), weight 70.3 kg (154 lb 15.7 oz), SpO2 96 %.  PHYSICAL EXAMINATION:   Physical Exam  GENERAL:  83 y.o.-year-old patient lying in bed in no acute distress.  EYES: Pupils equal, round, reactive to light and accommodation. No scleral icterus. Extraocular muscles intact.  HEENT: Head atraumatic, normocephalic. Oropharynx and nasopharynx clear.  NECK:  Supple, no jugular venous  distention. No thyroid enlargement, no tenderness.  LUNGS: Normal breath sounds bilaterally, no wheezing, rales, rhonchi. No use of accessory muscles of respiration.  CARDIOVASCULAR: S1, S2 normal. No murmurs, rubs, or gallops.  ABDOMEN: Soft, nontender, nondistended. Bowel sounds present. No organomegaly or mass.  EXTREMITIES: No cyanosis, clubbing or edema b/l.    NEUROLOGIC: Cranial nerves II through XII are intact. No focal Motor or sensory deficits b/l. Globally weak  PSYCHIATRIC: The patient is alert and oriented x 3.  SKIN: No obvious rash, lesion, or ulcer.    LABORATORY PANEL:   CBC  Recent Labs Lab 05/30/17 0827  WBC 14.8*  HGB 12.1*  HCT 36.9*  PLT 123*   ------------------------------------------------------------------------------------------------------------------  Chemistries   Recent Labs Lab 05/29/17 1443  05/31/17 0904  NA 138  < > 134*  K 4.7  < > 3.9  CL 101  < > 102  CO2 27  < > 26  GLUCOSE 137*  < > 105*  BUN 27*  < > 22*  CREATININE 0.96  < > 0.97  CALCIUM 9.5  < > 8.3*  MG  --   --  1.9  AST 339*  --   --   ALT 57  --   --   ALKPHOS 39  --   --   BILITOT 0.9  --   --   < > = values in this interval not displayed. ------------------------------------------------------------------------------------------------------------------  Cardiac Enzymes  Recent Labs Lab 05/30/17 0827  TROPONINI 40.51*   ------------------------------------------------------------------------------------------------------------------  RADIOLOGY:  No results found.   ASSESSMENT AND PLAN:   83 year old male with past medical history of hypertension, depression, hyperlipidemia, history of myasthenia  gravis, osteoarthritis who presented to the hospital with chest pain and noted to have an ST elevation MI.  1. ST elevation MI-this was the cause of patient's chest pain. Patient is status post cardiac catheterization which which showed a completely occluded  proximal LAD lesion which was not intervened on this patient presented 24 hours later. -Continue medical management presently with aspirin, Plavix, atorvastatin, carvedilol and possible initiation of ACE inhibitor blood pressure tolerates it.  2. Atrial fibrillation with rapid ventricular response-patient developed A. fib with RVR overnight. He was started on a Cardizem drip and now has converted to normal sinus rhythm. -Seen by cardiology and started on oral amiodarone load. -Echocardiogram showing ejection fraction of 30-35%. Continue further care as per cardiology.  3. Essential hypertension-continue carvedilol  4. BPH-continue Flomax.  5. Hyperlipidemia-continue atorvastatin.  Await physical therapy evaluation and possible DC next 24-48 hours.   All the records are reviewed and case discussed with Care Management/Social Worker. Management plans discussed with the patient, family and they are in agreement.  CODE STATUS: Full code  DVT Prophylaxis: Heparin subcutaneous  TOTAL TIME TAKING CARE OF THIS PATIENT: 30 minutes.   POSSIBLE D/C IN 1-2 DAYS, DEPENDING ON CLINICAL CONDITION.   Houston Siren M.D on 05/31/2017 at 3:49 PM  Between 7am to 6pm - Pager - [phone]  After 6pm go to [email or web address] - Scientist, research (life sciences) [facility]  Office  [phone]  CC: Primary care physician; Novella Olive, NP

## 2017-05-31 NOTE — Evaluation (Signed)
Physical Therapy Evaluation Patient Details Name: Raymond Rios MRN: [id number] DOB: Apr 04, 1931 Today's Date: 05/31/2017   History of Present Illness  83 y.o. male with a known history of Htn, Hyperlipidemia, have On-off chest pains for last 5-6 mths. He had pain in chest that resolved somewhat, but continue to have on-off pain in night so he came to ER, Noted to have STEMI- taken for cath- Noted total occlusion of LAD  Clinical Impression  Pt did well with ambulation/stairs and mobility.  He had relatively slow, but consistent cadence with no LOBs or overt safety concerns.  His HR and O2 remained stable t/o the effort and ultimately he should be safe to go home and felt good about this proposition.     Follow Up Recommendations No PT follow up    Equipment Recommendations       Recommendations for Other Services       Precautions / Restrictions Precautions Precautions: Fall Restrictions Weight Bearing Restrictions: No      Mobility  Bed Mobility Overal bed mobility: Independent                Transfers Overall transfer level: Independent Equipment used: None             General transfer comment: Pt able to rise with confidence  Ambulation/Gait Ambulation/Gait assistance: Supervision Ambulation Distance (Feet): 225 Feet Assistive device: None       General Gait Details: Pt with slow but safe and confident cadence.  He did not have any LOBs and his vitals remained stable the entire time.   Stairs Stairs: Yes Stairs assistance: Supervision Stair Management:  (pt used wall ) Number of Stairs: 3 General stair comments: Pt able to negotiate up/down steps safely  Wheelchair Mobility    Modified Rankin (Stroke Patients Only)       Balance                                             Pertinent Vitals/Pain Pain Assessment: No/denies pain    Home Living Family/patient expects to be discharged to:: Private residence Living  Arrangements: Alone Available Help at Discharge: Family (daughter lives close) Type of Home: House Home Access: Stairs to enter Entrance Stairs-Rails:  (post) Entrance Stairs-Number of Steps: 3   Home Equipment: Environmental consultant - 2 wheels;Cane - single point      Prior Function Level of Independence: Independent         Comments: Pt reports that he gets his groceries, runs errands, etc     Hand Dominance        Extremity/Trunk Assessment   Upper Extremity Assessment Upper Extremity Assessment: LUE deficits/detail LUE Deficits / Details: L UE grossly functional but much weaker than R (~4-/5)    Lower Extremity Assessment Lower Extremity Assessment: Overall WFL for tasks assessed       Communication   Communication: No difficulties  Cognition Arousal/Alertness: Awake/alert Behavior During Therapy: WFL for tasks assessed/performed Overall Cognitive Status: Within Functional Limits for tasks assessed                                        General Comments      Exercises     Assessment/Plan    PT Assessment Patent does not need any further PT  services  PT Problem List         PT Treatment Interventions      PT Goals (Current goals can be found in the Care Plan section)  Acute Rehab PT Goals Patient Stated Goal: go home PT Goal Formulation: All assessment and education complete, DC therapy    Frequency     Barriers to discharge        Co-evaluation               AM-PAC PT "6 Clicks" Daily Activity  Outcome Measure Difficulty turning over in bed (including adjusting bedclothes, sheets and blankets)?: None Difficulty moving from lying on back to sitting on the side of the bed? : None Difficulty sitting down on and standing up from a chair with arms (e.g., wheelchair, bedside commode, etc,.)?: None Help needed moving to and from a bed to chair (including a wheelchair)?: None Help needed walking in hospital room?: None Help needed  climbing 3-5 steps with a railing? : None 6 Click Score: 24    End of Session Equipment Utilized During Treatment: Gait belt Activity Tolerance: Patient tolerated treatment well Patient left: with bed alarm set;with call bell/phone within reach   PT Visit Diagnosis: Muscle weakness (generalized) (M62.81);Difficulty in walking, not elsewhere classified (R26.2)    Time: [id number] PT Time Calculation (min) (ACUTE ONLY): 17 min   Charges:   PT Evaluation $PT Eval Low Complexity: 1 Low     PT G Codes:   PT G-Codes **NOT FOR INPATIENT CLASS** Functional Assessment Tool Used: AM-PAC 6 Clicks Basic Mobility Functional Limitation: Mobility: Walking and moving around Mobility: Walking and Moving Around Current Status (V4098(G8978): 0 percent impaired, limited or restricted Mobility: Walking and Moving Around Goal Status (J1914(G8979): 0 percent impaired, limited or restricted Mobility: Walking and Moving Around Discharge Status (N8295(G8980): 0 percent impaired, limited or restricted    Malachi ProGalen R Jamason Peckham, DPT 05/31/2017, 5:21 PM

## 2017-05-31 NOTE — Progress Notes (Signed)
ANTICOAGULATION CONSULT NOTE - Initial Consult  Pharmacy Consult for Heparin  Indication: chest pain/ACS/STEMI  Allergies  Allergen Reactions  . Sulfonamide Derivatives     REACTION: unspecified    Patient Measurements: Height: 5\' 10"  (177.8 cm) Weight: 154 lb 15.7 oz (70.3 kg) IBW/kg (Calculated) : 73 Heparin Dosing Weight: 70  Vital Signs: BP: 100/49 (10/25 0400) Pulse Rate: 128 (10/25 0400)  Labs:  Recent Labs  05/29/17 1443 05/29/17 2043 05/30/17 0250 05/30/17 0452 05/30/17 0827 05/30/17 1256 05/31/17 0418  HGB 12.6*  --   --   --  12.1*  --   --   HCT 38.6*  --   --   --  36.9*  --   --   PLT 153  --   --   --  123*  --   --   APTT 24  --   --   --   --   --   --   LABPROT 13.5  --   --   --   --   --   --   INR 1.04  --   --   --   --   --   --   HEPARINUNFRC  --   --   --  0.48  --  0.45 0.28*  CREATININE 0.96  --   --   --  0.99  --   --   TROPONINI 53.64* 45.69* 44.99*  --  40.51*  --   --     Estimated Creatinine Clearance: 55.2 mL/min (by C-G formula based on SCr of 0.99 mg/dL).   Medical History: Past Medical History:  Diagnosis Date  . Allergy   . Anemia   . Arthritis   . Depression   . GERD (gastroesophageal reflux disease)   . History of chickenpox   . History of colon polyps   . Hyperlipidemia   . Hypertension   . Myasthenia gravis (HCC)   . Urine incontinence     Medications:  Prescriptions Prior to Admission  Medication Sig Dispense Refill Last Dose  . atenolol (TENORMIN) 25 MG tablet TAKE 1 TABLET EVERY NIGHT 90 tablet 2 Taking  . hydrocortisone (ANUSOL-HC) 25 MG suppository 1 suppository rectally at bedtime as needed. 15 suppository 3   . pantoprazole (PROTONIX) 40 MG tablet TAKE 1 TABLET DAILY 90 tablet 2 Taking  . simvastatin (ZOCOR) 40 MG tablet TAKE 1 TABLET AT NIGHT 90 tablet 3 Taking  . tamsulosin (FLOMAX) 0.4 MG CAPS capsule Take 1 capsule (0.4 mg total) by mouth daily. 90 capsule 3 Taking  . vitamin B-12  (CYANOCOBALAMIN) 1000 MCG tablet Take 1,000 mcg by mouth daily.   Taking  . Vitamins A & D (VITAMIN A & D) 10000-400 UNITS CAPS Take 1,000 mg by mouth daily.   Taking  . zoster vaccine live, PF, (ZOSTAVAX) 40981 UNT/0.65ML injection Inject 19,400 Units into the skin once. 1 each 0    Scheduled:  . aspirin  81 mg Oral Daily  . atorvastatin  40 mg Oral q1800  . carvedilol  3.125 mg Oral BID WC  . clopidogrel  75 mg Oral Q breakfast  . feeding supplement (ENSURE ENLIVE)  237 mL Oral BID BM  . heparin  1,050 Units Intravenous Once  . multivitamin with minerals  1 tablet Oral Daily  . pantoprazole  40 mg Oral Daily  . sodium chloride flush  3 mL Intravenous Q12H  . tamsulosin  0.4 mg Oral Daily  . vitamin B-12  1,000 mcg Oral Daily    Assessment: Pharmacy consulted to dose and monitor heparin in this 83 year old male being treated for STEMI. Patient is currently being medically management. Heparin is infusing at 900 units/hr.   Goal of Therapy:  Heparin level 0.3-0.7 units/ml Monitor platelets by anticoagulation protocol: Yes   Plan:  Will continue at 900 units/hr. Will obtain follow up level with am labs.   10/25 @ 0418 HL 0.28 subtherapeutic. Will rebolus w/ heparin 1050 units IV x 1 and increase rate to 1050 units/hr and will recheck HL @ 1400. Hgb slightly trending down.  Pharmacy will continue to monitor and adjust per consult.   Thomasene Rippleavid Wylee Dorantes, PharmD, BCPS Clinical Pharmacist 05/31/2017

## 2017-05-31 NOTE — Progress Notes (Signed)
Per CCMD pt had a 1.74sec pause. Md notified and made aware of. No new orders at this time. Will continue to monitor.

## 2017-06-01 ENCOUNTER — Telehealth: Payer: Self-pay | Admitting: Cardiovascular Disease

## 2017-06-01 LAB — CBC
HEMATOCRIT: 33.4 % — AB (ref 40.0–52.0)
HEMOGLOBIN: 11.2 g/dL — AB (ref 13.0–18.0)
MCH: 28.8 pg (ref 26.0–34.0)
MCHC: 33.5 g/dL (ref 32.0–36.0)
MCV: 86.1 fL (ref 80.0–100.0)
Platelets: 112 10*3/uL — ABNORMAL LOW (ref 150–440)
RBC: 3.88 MIL/uL — AB (ref 4.40–5.90)
RDW: 12.9 % (ref 11.5–14.5)
WBC: 8.5 10*3/uL (ref 3.8–10.6)

## 2017-06-01 MED ORDER — AMIODARONE HCL 200 MG PO TABS: ORAL_TABLET | ORAL | 1 refills | Status: DC

## 2017-06-01 MED ORDER — CARVEDILOL 3.125 MG PO TABS: 3.1250 mg | ORAL_TABLET | Freq: Two times a day (BID) | ORAL | 1 refills | Status: DC

## 2017-06-01 MED ORDER — CLOPIDOGREL BISULFATE 75 MG PO TABS: 75.0000 mg | ORAL_TABLET | Freq: Every day | ORAL | 1 refills | Status: DC

## 2017-06-01 MED ORDER — ASPIRIN 81 MG PO CHEW: 81.0000 mg | CHEWABLE_TABLET | Freq: Every day | ORAL | 1 refills | Status: DC

## 2017-06-01 MED ORDER — TAMSULOSIN HCL 0.4 MG PO CAPS: 0.4000 mg | ORAL_CAPSULE | Freq: Every day | ORAL | 1 refills | Status: DC

## 2017-06-01 MED ORDER — ATORVASTATIN CALCIUM 40 MG PO TABS: 40.0000 mg | ORAL_TABLET | Freq: Every day | ORAL | 1 refills | Status: DC

## 2017-06-01 NOTE — Progress Notes (Signed)
Progress Note  Patient Name: Raymond Rios Date of Encounter: 06/01/2017  Primary Cardiologist: Wallace Cullens, MD   Subjective   No chest pain or shortness of breath overnight.  Eager to go home.  Inpatient Medications    Scheduled Meds: . amiodarone  400 mg Oral BID  . aspirin  81 mg Oral Daily  . atorvastatin  40 mg Oral q1800  . carvedilol  3.125 mg Oral BID WC  . clopidogrel  75 mg Oral Q breakfast  . feeding supplement (ENSURE ENLIVE)  237 mL Oral BID BM  . multivitamin with minerals  1 tablet Oral Daily  . pantoprazole  40 mg Oral Daily  . tamsulosin  0.4 mg Oral Daily  . vitamin B-12  1,000 mcg Oral Daily   Continuous Infusions: . diltiazem (CARDIZEM) infusion Stopped (05/30/17 2345)   PRN Meds: acetaminophen, docusate sodium, ondansetron (ZOFRAN) IV   Vital Signs    Vitals:   05/31/17 1430 05/31/17 1808 05/31/17 1937 06/01/17 0528  BP: (!) 99/51 (!) 106/55 135/64 (!) 101/56  Pulse: 72  81   Resp:   18 16  Temp:   98.9 F (37.2 C) 97.6 F (36.4 C)  TempSrc:   Oral Oral  SpO2:   99% 98%  Weight:      Height:        Intake/Output Summary (Last 24 hours) at 06/01/17 0619 Last data filed at 06/01/17 0529  Gross per 24 hour  Intake              360 ml  Output             1080 ml  Net             -720 ml   Filed Weights   05/29/17 1453 05/29/17 1605  Weight: 154 lb (69.9 kg) 154 lb 15.7 oz (70.3 kg)    Physical Exam   GEN: Well nourished, well developed, in no acute distress.  HEENT: Grossly normal.  Neck: Supple, no JVD, carotid bruits, or masses. Cardiac: RRR, no murmurs, rubs, or gallops. No clubbing, cyanosis, edema.  Radials/DP/PT 2+ and equal bilaterally.  Respiratory:  Respirations regular and unlabored, clear to auscultation bilaterally. GI: Soft, nontender, nondistended, BS + x 4. MS: no deformity or atrophy. Skin: warm and dry, no rash. Neuro:  Strength and sensation are intact. Psych: AAOx3.  Normal affect.  Labs    Chemistry Recent  Labs Lab 05/29/17 1443 05/30/17 0827 05/31/17 0904  NA 138 134* 134*  K 4.7 3.8 3.9  CL 101 101 102  CO2 27 26 26   GLUCOSE 137* 120* 105*  BUN 27* 21* 22*  CREATININE 0.96 0.99 0.97  CALCIUM 9.5 8.6* 8.3*  PROT 7.4  --   --   ALBUMIN 4.1  --   --   AST 339*  --   --   ALT 57  --   --   ALKPHOS 39  --   --   BILITOT 0.9  --   --   GFRNONAA >60 >60 >60  GFRAA >60 >60 >60  ANIONGAP 10 7 6      Hematology Recent Labs Lab 05/29/17 1443 05/30/17 0827 06/01/17 0513  WBC 11.7* 14.8* 8.5  RBC 4.51 4.33* 3.88*  HGB 12.6* 12.1* 11.2*  HCT 38.6* 36.9* 33.4*  MCV 85.8 85.2 86.1  MCH 27.9 27.9 28.8  MCHC 32.5 32.7 33.5  RDW 12.9 13.0 12.9  PLT 153 123* 112*    Cardiac Enzymes Recent  Labs Lab 05/29/17 1443 05/29/17 2043 05/30/17 0250 05/30/17 0827  TROPONINI 53.64* 45.69* 44.99* 40.51*    Lab Results  Component Value Date   CHOL 89 05/30/2017   HDL 46 05/30/2017   LDLCALC 34 05/30/2017   LDLDIRECT 190.3 08/23/2009   TRIG 44 05/30/2017   CHOLHDL 1.9 05/30/2017    Lab Results  Component Value Date   TSH 1.311 05/31/2017     Radiology    No results found.  Telemetry    RSR, PVC's/couplets - Personally Reviewed  Cardiac Studies   83 y.o. male with history of hypertension, hyperlipidemia, COPD, and myasthenia gravis who was admitted on 10/23 with a late-presenting anterior STEMI.  LHC 05/29/2017: Coronary Findings    Dominance: Left  Left Main  Vessel is large.  Left Anterior Descending  Vessel is large.  Ost LAD to Prox LAD lesion, 40% stenosed.  Mid LAD lesion, 100% stenosed.  Left Circumflex  Vessel is large.  First Obtuse Marginal Branch  Vessel is small in size.  Second Obtuse Marginal Branch  Vessel is small in size.  Third Obtuse Marginal Branch  Vessel is moderate in size.  3rd Mrg lesion, 50% stenosed. The lesion is ulcerative.  Fourth Obtuse Marginal Branch  Vessel is small in size.  Left Posterior Descending Artery  Vessel is  moderate in size.  LPDA lesion, 80% stenosed.  Right Coronary Artery  Vessel is small. Vessel is angiographically normal.   83 y.o. male with history of hypertension, hyperlipidemia, COPD, and myasthenia gravis who was admitted on 10/23 with a late-presenting anterior STEMI.  Left Ventricle There is moderate to severe left ventricular systolic dysfunction. LVEF ~35%. LV end diastolic pressure is moderately elevated. There are LV function abnormalities due to segmental dysfunction. There is no evidence of mitral regurgitation. _____________  83 y.o. male with history of hypertension, hyperlipidemia, COPD, and myasthenia gravis who was admitted on 10/23 with a late-presenting anterior STEMI.  TTE 05/30/2017:  Study Conclusions   - Procedure narrative: Transthoracic echocardiography. The study   was technically difficult. - Left ventricle: There was mild focal basal hypertrophy of the   septum. Systolic function was moderately to severely reduced. The   estimated ejection fraction was in the range of 30% to 35%. There   is akinesis of the mid-apicalanteroseptal, anterior,   anterolateral, and apical myocardium. Doppler parameters are   consistent with abnormal left ventricular relaxation (grade 1   diastolic dysfunction). - Aortic valve: Mildly calcified annulus. Trileaflet. - Left atrium: The atrium was mildly dilated.   Patient Profile     83 y.o. male with history of hypertension, hyperlipidemia, COPD, and myasthenia gravis who was admitted on 10/23 with a late-presenting anterior STEMI   Occluded LAD  conservative therapy  LV dysfunction with an EF of 30-35%.  Assessment & Plan    1.  Late presenting Anterior STEMI/CAD:  S/p cath revealing a thrombotic occlusion of the proximal LAD  plan for medical therapy in the setting of pain free status, electrical/hemodynamic stability, and late presentation.  Denies chest pain this morning.  Eager to go home.  He lives locally by himself.   Cont asa, plavix,  blocker, statin.  He will benefit from outpatient cardiac rehab.  2.  ICM/HFrEF:  EF 30-35% by echo 10/24. Minus 720 ml overnight. + 101 for admission.  No weight in system for past 3 day.  Euvolemic on exam.  Cont  blocker.  No acei/arb/arni/mra in setting of relative hypotension at this time, but  would look to add ARB plus or minus Spironolactone as an outpatient.  3.  PAF: Maintaining sinus rhythm on amio and  blocker.  Would likely plan to continue amiodarone at 400 twice daily for 1 week followed by 200 twice daily for 1 week followed by 200 daily.  CHA2DS2VASc is 5.  There is some degree of dementia and he lives by himself.  He is not likely to be the best oral anticoagulation candidate.  Especially in light of ongoing aspirin and Plavix therapy.  4.  NSVT: Freq pvc's and couplets.  No sustained runs.  Asymptomatic.  Continue beta-blocker.  5. Essential HTN: stable  soft. Cont  blocker.  6.  HL:  LDL 34 on 10/24.   Signed, Nicolasa Duckinghristopher Berge, NP  06/01/2017, 6:19 AM    For questions or updates, please contact   Please consult [email or web address] for contact info under Cardiology/STEMI.

## 2017-06-01 NOTE — Clinical Social Work Note (Signed)
CSW was consulted for transportation on admission. RN CM has seen patient and patient and son state that there is no concern regarding transportation. York SpanielMonica Dylana Shaw MSW,LCSW [phone]

## 2017-06-01 NOTE — Progress Notes (Signed)
Patient is discharge home in a stable condition, summary and f/u care given to both pt and son verbalized understanding. 

## 2017-06-01 NOTE — Care Management Important Message (Signed)
Important Message  Patient Details  Name: Raymond Rios MRN: [id number] Date of Birth: 1931/04/12   Medicare Important Message Given:  Yes  Signed IM notice given   Raymond Rios, Raymond Noreen R, RN 06/01/2017, 2:28 PM

## 2017-06-01 NOTE — Telephone Encounter (Signed)
TCM....  Patient is being discharged   They saw Dr End and [name] in hospital     They are scheduled to see Dr End  on 06/13/17  They were seen for Stemi   They need to be seen within 7-10 days   Pt is not on wait list   Please call

## 2017-06-01 NOTE — Care Management (Addendum)
Patient admitted with stemi.  Cardiac cath revealed total occlusion of LAD which will be managed medically. Discussed need for palliative input as patient is a full code during progression. Physical therapy evaluated and had no recommendations.  Spoke with patient and his son.  Patient's daughter had spoken with patient primary nurse regarding "patient living alone."  Patient and son are in agreement with home health. Agency preference [facility]. Referral for SN PT and SW called and accepted. Requesting PT for home safety evaluation and social work for community resources for in home services.  Provided with list of in home care agencies.  Discussed these services not covered by medicare.  has a walker in the home "if I need it."  Provided with medi alert brochure.  May benefit from [facility] [employer] [facility] referral. Will consider. No issues accessing medical care, obtaining meds or with transportation.

## 2017-06-01 NOTE — Telephone Encounter (Signed)
Patient currently admitted

## 2017-06-01 NOTE — Discharge Summary (Signed)
[facility] [place] at [facility]   PATIENT NAME: Raymond Rios    MR#:  [id number]  DATE OF BIRTH:  1931/03/30  DATE OF ADMISSION:  05/29/2017 ADMITTING PHYSICIAN: Yvonne Kendall, MD  DATE OF DISCHARGE: 06/01/2017  PRIMARY CARE PHYSICIAN: Novella Olive, NP    ADMISSION DIAGNOSIS:  ST elevation myocardial infarction (STEMI), unspecified artery (HCC) [I21.3] STEMI (ST elevation myocardial infarction) (HCC) [I21.3]  DISCHARGE DIAGNOSIS:  Principal Problem:   STEMI (ST elevation myocardial infarction) (HCC)   SECONDARY DIAGNOSIS:   Past Medical History:  Diagnosis Date  . Allergy   . Anemia   . Arthritis   . CAD (coronary artery disease)    a. late presenting anterior STEMI 05/29/17. medically managed given > 24 hours out. LHC 05/29/17: thrombotic occlusion of pLAD, dominant LCx, OM3 50%, lpda 80%, ant & apical AK with EF 35%    . Depression   . GERD (gastroesophageal reflux disease)   . History of chickenpox   . History of colon polyps   . Hyperlipidemia   . Hypertension   . Ischemic cardiomyopathy    a. late presenting anterior STEMI 05/29/2017, TTE 05/30/17: EF 30-35%, AK of the mid-apicalanteroseptal, anterior, antlat, & apical wall, Gr1DD  . Myasthenia gravis (HCC)   . PAF (paroxysmal atrial fibrillation) (HCC)    a, newly diagnosed 06/01/2017; b. CHADS2VASC => 5 (CHF, HTN, age x 2, vascular disease)  . Urine incontinence     HOSPITAL COURSE:   83 year old male with past medical history of hypertension, depression, hyperlipidemia, history of myasthenia gravis, osteoarthritis who presented to the hospital with chest pain and noted to have an ST elevation MI.  1. ST elevation MI-this was the cause of patient's chest pain. Patient is status post cardiac catheterization which which showed a completely occluded proximal LAD lesion which was not intervened as patient presented 24 hours later. -As per cardiology recommended medical management.  Patient is  presently maintained on aspirin, Plavix, atorvastatin, carvedilol and is being discharged on that presently.  He will follow-up with cardiology in the next 2 weeks. -he has also been referred to cardiac rehab.  2. Atrial fibrillation with rapid ventricular response- patient developed some A. fib with RVR while in the hospital.  He received some IV Cardizem and converted to normal sinus rhythm.  As per cardiology patient was started on oral amiodarone load and is currently being discharged on that. -Long-term anticoagulation is to be discussed with cardiology as an outpatient.  3. Essential hypertension- he will continue carvedilol  4. BPH- he will continue Flomax.  5. Hyperlipidemia- he will continue atorvastatin.  Pt. Is being discharge home with Home Health Nursing, Social Work.   DISCHARGE CONDITIONS:   Stable.   CONSULTS OBTAINED:  Treatment Team:  Altamese Dilling, MD  DRUG ALLERGIES:   Allergies  Allergen Reactions  . Sulfonamide Derivatives     REACTION: unspecified    DISCHARGE MEDICATIONS:   Allergies as of 06/01/2017      Reactions   Sulfonamide Derivatives    REACTION: unspecified      Medication List    STOP taking these medications   atenolol 25 MG tablet Commonly known as:  TENORMIN   busPIRone 10 MG tablet Commonly known as:  BUSPAR   hydrocortisone 25 MG suppository Commonly known as:  ANUSOL-HC   pantoprazole 40 MG tablet Commonly known as:  PROTONIX   simvastatin 40 MG tablet Commonly known as:  ZOCOR     TAKE these medications  amiodarone 200 MG tablet Commonly known as:  PACERONE 400 mg PO BID X 7 days, then 200 mg PO BID X 7 days and then 200 mg Po Daily.   aspirin 81 MG chewable tablet Chew 1 tablet (81 mg total) by mouth daily.   atorvastatin 40 MG tablet Commonly known as:  LIPITOR Take 1 tablet (40 mg total) by mouth daily at 6 PM.   carvedilol 3.125 MG tablet Commonly known as:  COREG Take 1 tablet (3.125 mg  total) by mouth 2 (two) times daily with a meal.   clopidogrel 75 MG tablet Commonly known as:  PLAVIX Take 1 tablet (75 mg total) by mouth daily with breakfast.   levothyroxine 75 MCG tablet Commonly known as:  SYNTHROID, LEVOTHROID Take 75 mcg by mouth every morning.   PARoxetine 10 MG tablet Commonly known as:  PAXIL Take 10 mg by mouth daily.   tamsulosin 0.4 MG Caps capsule Commonly known as:  FLOMAX Take 1 capsule (0.4 mg total) by mouth daily.   zoster vaccine live (PF) 19400 UNT/0.65ML injection Commonly known as:  ZOSTAVAX Inject 19,400 Units into the skin once.         DISCHARGE INSTRUCTIONS:   DIET:  Cardiac diet  DISCHARGE CONDITION:  Stable  ACTIVITY:  Activity as tolerated  OXYGEN:  Home Oxygen: No.   Oxygen Delivery: room air  DISCHARGE LOCATION:  Home with Home health Nursing, Social Work.    If you experience worsening of your admission symptoms, develop shortness of breath, life threatening emergency, suicidal or homicidal thoughts you must seek medical attention immediately by calling 911 or calling your MD immediately  if symptoms less severe.  You Must read complete instructions/literature along with all the possible adverse reactions/side effects for all the Medicines you take and that have been prescribed to you. Take any new Medicines after you have completely understood and accpet all the possible adverse reactions/side effects.   Please note  You were cared for by a hospitalist during your hospital stay. If you have any questions about your discharge medications or the care you received while you were in the hospital after you are discharged, you can call the unit and asked to speak with the hospitalist on call if the hospitalist that took care of you is not available. Once you are discharged, your primary care physician will handle any further medical issues. Please note that NO REFILLS for any discharge medications will be authorized  once you are discharged, as it is imperative that you return to your primary care physician (or establish a relationship with a primary care physician if you do not have one) for your aftercare needs so that they can reassess your need for medications and monitor your lab values.     Today   No acute chest pain overnight.  Remains in NSR.  No further ectopy.    VITAL SIGNS:  Blood pressure (!) 103/51, pulse 63, temperature 97.8 F (36.6 C), temperature source Oral, resp. rate 16, height 5\' 10"  (1.778 m), weight 70.3 kg (154 lb 15.7 oz), SpO2 98 %.  I/O:   Intake/Output Summary (Last 24 hours) at 06/01/17 1544 Last data filed at 06/01/17 1415  Gross per 24 hour  Intake              600 ml  Output             1150 ml  Net             -  550 ml    PHYSICAL EXAMINATION:   GENERAL:  83 y.o.-year-old patient lying in the bed with no acute distress.  EYES: Pupils equal, round, reactive to light and accommodation. No scleral icterus. Extraocular muscles intact.  HEENT: Head atraumatic, normocephalic. Oropharynx and nasopharynx clear.  NECK:  Supple, no jugular venous distention. No thyroid enlargement, no tenderness.  LUNGS: Normal breath sounds bilaterally, no wheezing, rales,rhonchi. No use of accessory muscles of respiration.  CARDIOVASCULAR: S1, S2 normal. No murmurs, rubs, or gallops.  ABDOMEN: Soft, non-tender, non-distended. Bowel sounds present. No organomegaly or mass.  EXTREMITIES: No pedal edema, cyanosis, or clubbing.  NEUROLOGIC: Cranial nerves II through XII are intact. No focal motor or sensory defecits b/l.  PSYCHIATRIC: The patient is alert and oriented x 3.  SKIN: No obvious rash, lesion, or ulcer.   DATA REVIEW:   CBC  Recent Labs Lab 06/01/17 0513  WBC 8.5  HGB 11.2*  HCT 33.4*  PLT 112*    Chemistries   Recent Labs Lab 05/29/17 1443  05/31/17 0904  NA 138  < > 134*  K 4.7  < > 3.9  CL 101  < > 102  CO2 27  < > 26  GLUCOSE 137*  < > 105*  BUN 27*   < > 22*  CREATININE 0.96  < > 0.97  CALCIUM 9.5  < > 8.3*  MG  --   --  1.9  AST 339*  --   --   ALT 57  --   --   ALKPHOS 39  --   --   BILITOT 0.9  --   --   < > = values in this interval not displayed.  Cardiac Enzymes  Recent Labs Lab 05/30/17 0827  TROPONINI 40.51*    Microbiology Results  Results for orders placed or performed during the hospital encounter of 05/29/17  MRSA PCR Screening     Status: None   Collection Time: 05/29/17  5:06 PM  Result Value Ref Range Status   MRSA by PCR NEGATIVE NEGATIVE Final    Comment:        The GeneXpert MRSA Assay (FDA approved for NASAL specimens only), is one component of a comprehensive MRSA colonization surveillance program. It is not intended to diagnose MRSA infection nor to guide or monitor treatment for MRSA infections.     RADIOLOGY:  No results found.    Management plans discussed with the patient, family and they are in agreement.  CODE STATUS:     Code Status Orders        Start     Ordered   05/29/17 1730  Full code  Continuous     05/29/17 1729      Advance Directive Documentation     Most Recent Value  Type of Advance Directive  Living will, Healthcare Power of Attorney  Pre-existing out of facility DNR order (yellow form or pink MOST form)  -  "MOST" Form in Place?  -      TOTAL TIME TAKING CARE OF THIS PATIENT: 40 minutes.    Houston SirenSAINANI,Kenli Waldo J M.D on 06/01/2017 at 3:44 PM  Between 7am to 6pm - Pager - [phone]  After 6pm go to [email or web address] - Scientist, research (life sciences)password EPAS [facility]  [facility] [place] [facility]  Office  [phone]  CC: Primary care physician; Novella Oliveey, Raquel M, NP

## 2017-06-05 NOTE — Telephone Encounter (Signed)
Patient [facility] on [facility].   Patient understands to follow up with provider ? [facility] 06[facility] [name], NP.  Patient understands discharge instructions? Yes   Patient understands medications and regiment? Yes  Patient understands to bring all medications to this visit? Yes

## 2017-06-13 ENCOUNTER — Ambulatory Visit: Payer: Medicare PPO | Admitting: Nurse Practitioner

## 2017-06-13 ENCOUNTER — Encounter: Payer: Self-pay | Admitting: Nurse Practitioner

## 2017-06-13 VITALS — BP 92/52 | HR 58 | Ht 70.0 in | Wt 153.0 lb

## 2017-06-13 DIAGNOSIS — I10 Essential (primary) hypertension: Secondary | ICD-10-CM | POA: Diagnosis not present

## 2017-06-13 DIAGNOSIS — I2109 ST elevation (STEMI) myocardial infarction involving other coronary artery of anterior wall: Secondary | ICD-10-CM

## 2017-06-13 DIAGNOSIS — I255 Ischemic cardiomyopathy: Secondary | ICD-10-CM | POA: Diagnosis not present

## 2017-06-13 DIAGNOSIS — I5022 Chronic systolic (congestive) heart failure: Secondary | ICD-10-CM

## 2017-06-13 DIAGNOSIS — E785 Hyperlipidemia, unspecified: Secondary | ICD-10-CM

## 2017-06-13 DIAGNOSIS — I480 Paroxysmal atrial fibrillation: Secondary | ICD-10-CM

## 2017-06-13 MED ORDER — CARVEDILOL 3.125 MG PO TABS: 3.1250 mg | ORAL_TABLET | Freq: Two times a day (BID) | ORAL | 3 refills | Status: DC

## 2017-06-13 MED ORDER — AMIODARONE HCL 200 MG PO TABS: ORAL_TABLET | ORAL | 3 refills | Status: DC

## 2017-06-13 MED ORDER — CLOPIDOGREL BISULFATE 75 MG PO TABS: 75.0000 mg | ORAL_TABLET | Freq: Every day | ORAL | 3 refills | Status: DC

## 2017-06-13 MED ORDER — ATORVASTATIN CALCIUM 40 MG PO TABS: 40.0000 mg | ORAL_TABLET | Freq: Every day | ORAL | 3 refills | Status: DC

## 2017-06-13 MED ORDER — ASPIRIN 81 MG PO CHEW: 81.0000 mg | CHEWABLE_TABLET | Freq: Every day | ORAL | 3 refills | Status: DC

## 2017-06-13 NOTE — Patient Instructions (Addendum)
Medication Instructions:  Your physician recommends that you continue on your current medications as directed. Please refer to the Current Medication list given to you today.   Labwork: none  Testing/Procedures: none  Follow-Up: Your physician recommends that you schedule a follow-up appointment in: 1 MONTH WITH DR END OR APP.   If you need a refill on your cardiac medications before your next appointment, please call your pharmacy.    DASH Eating Plan DASH stands for "Dietary Approaches to Stop Hypertension." The DASH eating plan is a healthy eating plan that has been shown to reduce high blood pressure (hypertension). It may also reduce your risk for type 2 diabetes, heart disease, and stroke. The DASH eating plan may also help with weight loss. What are tips for following this plan? General guidelines  Avoid eating more than 2,300 mg (milligrams) of salt (sodium) a day. If you have hypertension, you may need to reduce your sodium intake to 1,500 mg a day.  Limit alcohol intake to no more than 1 drink a day for nonpregnant women and 2 drinks a day for men. One drink equals 12 oz of beer, 5 oz of wine, or 1 oz of hard liquor.  Work with your health care provider to maintain a healthy body weight or to lose weight. Ask what an ideal weight is for you.  Get at least 30 minutes of exercise that causes your heart to beat faster (aerobic exercise) most days of the week. Activities may include walking, swimming, or biking.  Work with your health care provider or diet and nutrition specialist (dietitian) to adjust your eating plan to your individual calorie needs. Reading food labels  Check food labels for the amount of sodium per serving. Choose foods with less than 5 percent of the Daily Value of sodium. Generally, foods with less than 300 mg of sodium per serving fit into this eating plan.  To find whole grains, look for the word "whole" as the first word in the ingredient  list. Shopping  Buy products labeled as "low-sodium" or "no salt added."  Buy fresh foods. Avoid canned foods and premade or frozen meals. Cooking  Avoid adding salt when cooking. Use salt-free seasonings or herbs instead of table salt or sea salt. Check with your health care provider or pharmacist before using salt substitutes.  Do not fry foods. Cook foods using healthy methods such as baking, boiling, grilling, and broiling instead.  Cook with heart-healthy oils, such as olive, canola, soybean, or sunflower oil. Meal planning   Eat a balanced diet that includes: ? 5 or more servings of fruits and vegetables each day. At each meal, try to fill half of your plate with fruits and vegetables. ? Up to 6-8 servings of whole grains each day. ? Less than 6 oz of lean meat, poultry, or fish each day. A 3-oz serving of meat is about the same size as a deck of cards. One egg equals 1 oz. ? 2 servings of low-fat dairy each day. ? A serving of nuts, seeds, or beans 5 times each week. ? Heart-healthy fats. Healthy fats called Omega-3 fatty acids are found in foods such as flaxseeds and coldwater fish, like sardines, salmon, and mackerel.  Limit how much you eat of the following: ? Canned or prepackaged foods. ? Food that is high in trans fat, such as fried foods. ? Food that is high in saturated fat, such as fatty meat. ? Sweets, desserts, sugary drinks, and other foods with added  sugar. ? Full-fat dairy products.  Do not salt foods before eating.  Try to eat at least 2 vegetarian meals each week.  Eat more home-cooked food and less restaurant, buffet, and fast food.  When eating at a restaurant, ask that your food be prepared with less salt or no salt, if possible. What foods are recommended? The items listed may not be a complete list. Talk with your dietitian about what dietary choices are best for you. Grains Whole-grain or whole-wheat bread. Whole-grain or whole-wheat pasta. Brown  rice. [place]. Bulgur. Whole-grain and low-sodium cereals. Pita bread. Low-fat, low-sodium crackers. Whole-wheat flour tortillas. Vegetables Fresh or frozen vegetables (raw, steamed, roasted, or grilled). Low-sodium or reduced-sodium tomato and vegetable juice. Low-sodium or reduced-sodium tomato sauce and tomato paste. Low-sodium or reduced-sodium canned vegetables. Fruits All fresh, dried, or frozen fruit. Canned fruit in natural juice (without added sugar). Meat and other protein foods Skinless chicken or [place]. Ground chicken or [place]. Pork with fat trimmed off. Fish and seafood. Egg whites. Dried beans, peas, or lentils. Unsalted nuts, nut butters, and seeds. Unsalted canned beans. Lean cuts of beef with fat trimmed off. Low-sodium, lean deli meat. Dairy Low-fat (1%) or fat-free (skim) milk. Fat-free, low-fat, or reduced-fat cheeses. Nonfat, low-sodium ricotta or cottage cheese. Low-fat or nonfat yogurt. Low-fat, low-sodium cheese. Fats and oils Soft margarine without trans fats. Vegetable oil. Low-fat, reduced-fat, or light mayonnaise and salad dressings (reduced-sodium). Canola, safflower, olive, soybean, and sunflower oils. Avocado. Seasoning and other foods Herbs. Spices. Seasoning mixes without salt. Unsalted popcorn and pretzels. Fat-free sweets. What foods are not recommended? The items listed may not be a complete list. Talk with your dietitian about what dietary choices are best for you. Grains Baked goods made with fat, such as croissants, muffins, or some breads. Dry pasta or rice meal packs. Vegetables Creamed or fried vegetables. Vegetables in a cheese sauce. Regular canned vegetables (not low-sodium or reduced-sodium). Regular canned tomato sauce and paste (not low-sodium or reduced-sodium). Regular tomato and vegetable juice (not low-sodium or reduced-sodium). Angie Fava. Olives. Fruits Canned fruit in a light or heavy syrup. Fried fruit. Fruit in cream or butter  sauce. Meat and other protein foods Fatty cuts of meat. Ribs. Fried meat. Berniece Salines. Sausage. Bologna and other processed lunch meats. Salami. Fatback. Hotdogs. Bratwurst. Salted nuts and seeds. Canned beans with added salt. Canned or smoked fish. Whole eggs or egg yolks. Chicken or [place] with skin. Dairy Whole or 2% milk, cream, and half-and-half. Whole or full-fat cream cheese. Whole-fat or sweetened yogurt. Full-fat cheese. Nondairy creamers. Whipped toppings. Processed cheese and cheese spreads. Fats and oils Butter. Stick margarine. Lard. Shortening. Ghee. Bacon fat. Tropical oils, such as coconut, palm kernel, or palm oil. Seasoning and other foods Salted popcorn and pretzels. Onion salt, garlic salt, seasoned salt, table salt, and sea salt. Worcestershire sauce. Tartar sauce. Barbecue sauce. Teriyaki sauce. Soy sauce, including reduced-sodium. Steak sauce. Canned and packaged gravies. Fish sauce. Oyster sauce. Cocktail sauce. Horseradish that you find on the shelf. Ketchup. Mustard. Meat flavorings and tenderizers. Bouillon cubes. Hot sauce and Tabasco sauce. Premade or packaged marinades. Premade or packaged taco seasonings. Relishes. Regular salad dressings. Where to find more information:  National Heart, Lung, and [place]: https://wilson-eaton.com/  American Heart Association: www.heart.org Summary  The DASH eating plan is a healthy eating plan that has been shown to reduce high blood pressure (hypertension). It may also reduce your risk for type 2 diabetes, heart disease, and stroke.  With the DASH eating  plan, you should limit salt (sodium) intake to 2,300 mg a day. If you have hypertension, you may need to reduce your sodium intake to 1,500 mg a day.  When on the DASH eating plan, aim to eat more fresh fruits and vegetables, whole grains, lean proteins, low-fat dairy, and heart-healthy fats.  Work with your health care provider or diet and nutrition specialist (dietitian) to adjust  your eating plan to your individual calorie needs. This information is not intended to replace advice given to you by your health care provider. Make sure you discuss any questions you have with your health care provider. Document Released: 07/13/2011 Document Revised: 07/17/2016 Document Reviewed: 07/17/2016 Elsevier Interactive Patient Education  2017 [employer]

## 2017-06-13 NOTE — Progress Notes (Signed)
Office Visit    Patient Name: Raymond Rios Date of Encounter: 06/13/2017  Primary Care Provider:  Jaclyn Shaggyate, Denny C, MD Primary Cardiologist:  Wallace Cullens. End, MD   Chief Complaint    83 y/o ? with a h/o CAD status post recent late presenting anterior ST elevation MI with finding of occluded LAD, ischemic cardia myopathy/chronic systolic congestive heart failure, hypertension, hyper lipidemia, COPD, and myasthenia gravis, who presents for follow-up.  Past Medical History    Past Medical History:  Diagnosis Date  . Allergy   . Anemia   . Arthritis   . CAD (coronary artery disease)    a. late presenting anterior STEMI 05/29/17 Cath: thrombotic occlusion of pLAD, dominant LCx, OM3 50%, lpda 80%, ant & apical AK with EF 35%.    . Depression   . GERD (gastroesophageal reflux disease)   . History of chickenpox   . History of colon polyps   . Hyperlipidemia   . Hypertension   . Ischemic cardiomyopathy    a. late presenting anterior STEMI 05/29/2017, TTE 05/30/17: EF 30-35%, AK of the mid-apicalanteroseptal, anterior, antlat, & apical wall, Gr1DD  . Myasthenia gravis (HCC)   . PAF (paroxysmal atrial fibrillation) (HCC)    a, newly diagnosed 06/01/2017; b. CHADS2VASC => 5 (CHF, HTN, age x 2, vascular disease)-->No OAC in the setting of ongoing ASA/Plavix, mild dementia, lives alone.  On Amio.  Marland Kitchen. Urine incontinence    Past Surgical History:  Procedure Laterality Date  . CHOLECYSTECTOMY    . TONSILLECTOMY AND ADENOIDECTOMY      Allergies  Allergies  Allergen Reactions  . Sulfonamide Derivatives     REACTION: unspecified    History of Present Illness    83 year old ? with the above complex past medical history including hypertension, hyperlipidemia, COPD, and myasthenia gravis.  He was recently admitted October 23 with late presenting anterior ST segment elevation myocardial infarction.  Echo showed an EF of 30-35%.  Cath showed a thrombotic occlusion of the LAD and otherwise  nonobstructive disease.  Medical therapy was recommended given late presentation and absence of chest pain.  During auscultation, he was noted to have proximal atrial fibrillation was initiated on amiodarone therapy.  We did not use oral anticoagulation as he was already on aspirin and Plavix and he was noted to have mild dementia and also lives alone.  Since his discharge, he has been doing well.  He has not had any chest pain or dyspnea.  Has been weighing himself daily and weight is been stable in the 153-155 range.  His son and daughter-in-law prepare his medications once a week and he reports compliance.  He also has home health and home health physical therapy coming out.  He denies chest pain, dyspnea, PND, orthopnea, palpitations, syncope, edema, or early satiety.  He sometimes experience his lightheadedness with standing too quickly.  Home Medications    Prior to Admission medications   Medication Sig Start Date End Date Taking? Authorizing Provider  amiodarone (PACERONE) 200 MG tablet 400 mg PO BID X 7 days, then 200 mg PO BID X 7 days and then 200 mg Po Daily. 06/01/17  Yes Houston SirenSainani, Vivek J, MD  aspirin 81 MG chewable tablet Chew 1 tablet (81 mg total) by mouth daily. 06/02/17  Yes Houston SirenSainani, Vivek J, MD  atorvastatin (LIPITOR) 40 MG tablet Take 1 tablet (40 mg total) by mouth daily at 6 PM. 06/01/17  Yes Sainani, Rolly PancakeVivek J, MD  carvedilol (COREG) 3.125 MG tablet Take  1 tablet (3.125 mg total) by mouth 2 (two) times daily with a meal. 06/01/17  Yes Sainani, Rolly PancakeVivek J, MD  clopidogrel (PLAVIX) 75 MG tablet Take 1 tablet (75 mg total) by mouth daily with breakfast. 06/02/17  Yes Sainani, Rolly PancakeVivek J, MD  levothyroxine (SYNTHROID, LEVOTHROID) 75 MCG tablet Take 75 mcg by mouth every morning. 03/23/17  Yes [provider]  PARoxetine (PAXIL) 10 MG tablet Take 10 mg by mouth daily. 05/03/17  Yes [provider]  tamsulosin (FLOMAX) 0.4 MG CAPS capsule Take 1 capsule (0.4 mg total) by mouth  daily. 06/01/17  Yes Houston SirenSainani, Vivek J, MD  zoster vaccine live, PF, (ZOSTAVAX) [id number] UNT/0.65ML injection Inject 19,400 Units into the skin once. 02/16/14  Yes Rey, Richarda Overlieaquel M, NP    Review of Systems    Some lightheadedness when standing too quickly.  He denies chest pain, palpitations, dyspnea, pnd, orthopnea, n, v, syncope, edema, weight gain, or early satiety.  All other systems reviewed and are otherwise negative except as noted above.  Physical Exam    VS:  BP (!) 92/52 (BP Location: Left Arm, Patient Position: Sitting, Cuff Size: Normal)   Pulse (!) 58   Ht 5\' 10"  (1.778 m)   Wt 153 lb (69.4 kg)   BMI 21.95 kg/m  , BMI Body mass index is 21.95 kg/m. GEN: Well nourished, well developed, in no acute distress.  HEENT: normal.  Neck: Supple, no JVD, carotid bruits, or masses. Cardiac: RRR, no murmurs, rubs, or gallops. No clubbing, cyanosis, edema.  Radials/DP/PT 2+ and equal bilaterally.  Respiratory:  Respirations regular and unlabored, clear to auscultation bilaterally. GI: Soft, nontender, nondistended, BS + x 4. MS: no deformity or atrophy. Skin: warm and dry, no rash. Neuro:  Strength and sensation are intact. Psych: Normal affect.  Accessory Clinical Findings    ECG -sinus bradycardia, 58, left axis deviation, anterior infarct  Assessment & Plan    1.  Anterior ST segment elevation myocardial infarction, subsequent episode of care/CAD: Status post recent admission and finding of LV dysfunction.  Catheterization revealed a thrombotic occlusion of the LAD.  Given late presentation and absence of symptoms, medical therapy was recommended.  He has done well without chest pain or dyspnea and remains on aspirin, statin, beta-blocker, and Plavix therapy.  He unfortunately has transportation issues and therefore will not be participating in cardiac rehab.  He currently has home health physical therapy coming out to his house.  2.  Ischemic cardiomyopathy/HFrEF: EF 30-35% by  echocardiogram October 24.  His weight is stable and he does not require Lasix at home.  He has not been having any significant dyspnea, PND, or orthopnea.  He is only on low-dose carvedilol in the setting of soft blood pressures and thus there is no room to add acei/arb/entresto/spiro.  He has had some lightheadedness with standing and rather than discontinue or reduce his carvedilol, I have asked him to stand more slowly and deliberately.  3.  Paroxysmal atrial for ablation: Noted during hospitalization.  Controlled with amiodarone and he is in sinus rhythm today.  Reducing dose to 200 mg daily beginning tomorrow as planned.  4.  Hyperlipidemia: LDL 34 in October 24.  Continue statin therapy.  5.  Disposition: Follow-up in clinic in 1 month or sooner if necessary.   Nicolasa Duckinghristopher Flora Parks, NP 06/13/2017, 3:20 PM

## 2017-06-14 ENCOUNTER — Telehealth: Payer: Self-pay | Admitting: Nurse Practitioner

## 2017-06-14 NOTE — Telephone Encounter (Signed)
Patient notified all medications were sent in yesterday to [facility].  The patient will contact the pharmacy and if a problem, he will contact our office back.

## 2017-06-14 NOTE — Telephone Encounter (Signed)
Patient medications did not go through yesterday after ov   Please resend all meds [facility]

## 2017-06-18 ENCOUNTER — Ambulatory Visit: Payer: Medicare PPO | Admitting: Cardiovascular Disease

## 2017-07-17 ENCOUNTER — Ambulatory Visit: Payer: Medicare PPO | Admitting: Nurse Practitioner

## 2017-08-15 ENCOUNTER — Ambulatory Visit: Payer: Medicare PPO | Admitting: Nurse Practitioner

## 2017-08-15 ENCOUNTER — Encounter: Payer: Self-pay | Admitting: Nurse Practitioner

## 2017-08-15 VITALS — BP 110/68 | HR 58 | Ht 69.5 in | Wt 158.8 lb

## 2017-08-15 DIAGNOSIS — Z79899 Other long term (current) drug therapy: Secondary | ICD-10-CM | POA: Diagnosis not present

## 2017-08-15 DIAGNOSIS — I5022 Chronic systolic (congestive) heart failure: Secondary | ICD-10-CM | POA: Diagnosis not present

## 2017-08-15 DIAGNOSIS — I2510 Atherosclerotic heart disease of native coronary artery without angina pectoris: Secondary | ICD-10-CM

## 2017-08-15 DIAGNOSIS — I480 Paroxysmal atrial fibrillation: Secondary | ICD-10-CM | POA: Diagnosis not present

## 2017-08-15 DIAGNOSIS — E785 Hyperlipidemia, unspecified: Secondary | ICD-10-CM | POA: Diagnosis not present

## 2017-08-15 DIAGNOSIS — I255 Ischemic cardiomyopathy: Secondary | ICD-10-CM

## 2017-08-15 MED ORDER — CARVEDILOL 3.125 MG PO TABS: 3.1250 mg | ORAL_TABLET | Freq: Two times a day (BID) | ORAL | 3 refills | Status: DC

## 2017-08-15 MED ORDER — ATORVASTATIN CALCIUM 40 MG PO TABS: 40.0000 mg | ORAL_TABLET | Freq: Every day | ORAL | 3 refills | Status: DC

## 2017-08-15 MED ORDER — AMIODARONE HCL 200 MG PO TABS: 200.0000 mg | ORAL_TABLET | Freq: Every day | ORAL | 3 refills | Status: DC

## 2017-08-15 MED ORDER — CLOPIDOGREL BISULFATE 75 MG PO TABS: 75.0000 mg | ORAL_TABLET | Freq: Every day | ORAL | 3 refills | Status: DC

## 2017-08-15 NOTE — Progress Notes (Signed)
Office Visit    Patient Name: Raymond Rios Date of Encounter: 08/15/2017  Primary Care Provider:  Jaclyn Shaggyate, Denny C, MD Primary Cardiologist:  Yvonne Kendallhristopher End, MD  Chief Complaint    84 y/o ? with a history of coronary artery disease status post late presenting anterior ST segment elevation myocardial infarction in October 2018 with finding of occluded LAD, ischemic cardiomyopathy/HFrEF, paroxysmal atrial fibrillation, hypertension, hyperlipidemia, COPD, and myasthenia gravis, who presents for follow-up.  Past Medical History    Past Medical History:  Diagnosis Date  . Allergy   . Anemia   . Arthritis   . CAD (coronary artery disease)    a. late presenting anterior STEMI 05/29/17 Cath: thrombotic occlusion of pLAD, dominant LCx, OM3 50%, lpda 80%, ant & apical AK with EF 35%.    . Depression   . GERD (gastroesophageal reflux disease)   . History of chickenpox   . History of colon polyps   . Hyperlipidemia   . Hypertension   . Ischemic cardiomyopathy    a. late presenting anterior STEMI 05/29/2017, TTE 05/30/17: EF 30-35%, AK of the mid-apicalanteroseptal, anterior, antlat, & apical wall, Gr1DD  . Myasthenia gravis (HCC)   . PAF (paroxysmal atrial fibrillation) (HCC)    a, newly diagnosed 06/01/2017; b. CHADS2VASC => 5 (CHF, HTN, age x 2, vascular disease)-->No OAC in the setting of ongoing ASA/Plavix, mild dementia, lives alone.  On Amio.  Marland Kitchen. Urine incontinence    Past Surgical History:  Procedure Laterality Date  . CHOLECYSTECTOMY    . LEFT HEART CATH AND CORONARY ANGIOGRAPHY N/A 05/29/2017   Procedure: LEFT HEART CATH AND CORONARY ANGIOGRAPHY;  Surgeon: Yvonne KendallEnd, Mannie Wineland, MD;  Location: [facility] INVASIVE [facility];  Service: Cardiovascular;  Laterality: N/A;  . TONSILLECTOMY AND ADENOIDECTOMY      Allergies  Allergies  Allergen Reactions  . Sulfonamide Derivatives     REACTION: unspecified    History of Present Illness    84 year old male with the above complex past medical  history including hypertension, hyperlipidemia, COPD, and myasthenia gravis.  In October 2018, he was admitted with late presenting anterior STEMI.  Echo showed EF 30-35%.  Cath showed a thrombotic occlusion of the LAD and otherwise nonobstructive disease.  Medical therapy was recommended given late presentation and absence of chest pain.  During hospitalization, he was noted to have paroxysmal atrial fibrillation and he was placed on amiodarone therapy.  He converted to sinus rhythm.  Oral anticoagulation was not initiated as he was already on dual antiplatelet therapy, has dementia, and was living alone.  He was seen in clinic in November, at which time he was doing reasonably well.  In follow-up today, he reports continuing to do well.  He recently had labs drawn at his primary care provider though he has not received results yet.  He is currently staying at an independent living facility, which she does not like.  He misses being at home.  He has not been particularly active there and has not been weighing himself.  He denies chest pain, dyspnea, palpitations, PND, orthopnea, syncope, or early satiety.  He does experience intermittent lightheadedness with standing and had 2 episodes of falling in November.  He has not lost consciousness.  He says that he feels that he is now more used to the medicine that he is taking and has not had any significant symptoms in some time.  He also notes mild ankle swelling from time to time.  Home Medications    Prior  to Admission medications   Medication Sig Start Date End Date Taking? Authorizing Provider  amiodarone (PACERONE) 200 MG tablet Take 1 tablet (200 mg total) by mouth daily. 08/15/17  Yes Creig Hines, NP  aspirin 81 MG chewable tablet Chew 1 tablet (81 mg total) daily by mouth. 06/13/17  Yes Creig Hines, NP  atorvastatin (LIPITOR) 40 MG tablet Take 1 tablet (40 mg total) by mouth daily at 6 PM. 08/15/17  Yes Creig Hines, NP  carvedilol (COREG) 3.125 MG tablet Take 1 tablet (3.125 mg total) by mouth 2 (two) times daily with a meal. 08/15/17  Yes Creig Hines, NP  clopidogrel (PLAVIX) 75 MG tablet Take 1 tablet (75 mg total) by mouth daily with breakfast. 08/15/17  Yes Creig Hines, NP  levothyroxine (SYNTHROID, LEVOTHROID) 75 MCG tablet Take 75 mcg by mouth every morning. 03/23/17  Yes [provider]  tamsulosin (FLOMAX) 0.4 MG CAPS capsule Take 0.4 mg by mouth daily.   Yes [provider]  zoster vaccine live, PF, (ZOSTAVAX) [id number] UNT/0.65ML injection Inject 19,400 Units into the skin once. 02/16/14  Yes Rey, Richarda Overlie, NP    Review of Systems    Overall doing well without chest pain, dyspnea, palpitations, PND, orthopnea, syncope, or early satiety.  He does still sometimes experience lightheadedness with standing.  He also occasionally notes mild ankle swelling.  All other systems reviewed and are otherwise negative except as noted above.  Physical Exam    VS:  BP 110/68 (BP Location: Left Arm, Patient Position: Sitting, Cuff Size: Normal)   Pulse (!) 58   Ht 5' 9.5" (1.765 m)   Wt 158 lb 12 oz (72 kg)   BMI 23.11 kg/m  , BMI Body mass index is 23.11 kg/m. GEN: Well nourished, well developed, in no acute distress.  HEENT: normal.  Neck: Supple, no JVD, carotid bruits, or masses. Cardiac: RRR, no murmurs, rubs, or gallops. No clubbing, cyanosis, edema.  Radials/DP/PT 2+ and equal bilaterally.  Respiratory:  Respirations regular and unlabored, clear to auscultation bilaterally. GI: Soft, nontender, nondistended, BS + x 4. MS: no deformity or atrophy. Skin: warm and dry, no rash. Neuro:  Strength and sensation are intact. Psych: Normal affect.  Accessory Clinical Findings    ECG -sinus bradycardia, 58, left axis deviation, septal infarct with lateral T wave inversion.  No acute changes.  Assessment & Plan    1.  Coronary artery disease: Status post  admission in October with late presenting anterior ST segment elevation myocardial infarction.  Catheterization revealed a thrombotic occlusion of the LAD.  He has been medically managed in the setting of late presentation and absence of symptoms.  He has done well without chest pain or dyspnea and remains on aspirin, statin, beta-blocker, and Plavix.  He is not participating in cardiac rehabilitation as he does not have transportation.  2.  Ischemic cardiomyopathy/HFrEF: EF 30-35% by echocardiogram May 30, 2017.  He is euvolemic on exam.  I did encourage him to weigh himself daily and report symptoms or changes in weight.  He remains on low-dose carvedilol and with history of lightheadedness/orthostasis and ongoing soft blood pressures-110/68-there really is not room to add ACE inhibitor, ARB, Entresto, or Spironolactone.  Plan to follow-up echocardiogram at the end of the month as it will have been 3 months since his event.  3.  Paroxysmal atrial fibrillation: Noted during hospitalization.  He denies any significant palpitations.  He remains on amiodarone and is in sinus  rhythm today.  CHA2DS2VASc equals 5.  He is not on oral anticoagulation in the setting of dual antiplatelet therapy with concerns regarding dementia and falls.  4.  Hyperlipidemia: LDL 34 in October.  Continue statin therapy.  5.  Disposition: He just had labs drawn his primary care provider's office and we have contacted them and requested copies.  Follow-up echocardiogram later this month.  Follow-up with Dr. Okey Dupre in 3-4 months.   Nicolasa Ducking, NP 08/15/2017, 12:28 PM

## 2017-08-15 NOTE — Patient Instructions (Signed)
Medication Instructions: - Your physician recommends that you continue on your current medications as directed. Please refer to the Current Medication list given to you today.  Labwork: - none ordered  Procedures/Testing: - Your physician has requested that you have an echocardiogram at the end of the month (after 08/30/17). Echocardiography is a painless test that uses sound waves to create images of your heart. It provides your doctor with information about the size and shape of your heart and how well your heart's chambers and valves are working. This procedure takes approximately one hour. There are no restrictions for this procedure.  Follow-Up: - Your physician wants you to follow-up in: 3 months with Dr. Okey DupreEnd.  You will receive a reminder letter in the mail two months in advance. If you don't receive a letter, please call our office to schedule the follow-up appointment.   Any Additional Special Instructions Will Be Listed Below (If Applicable).     If you need a refill on your cardiac medications before your next appointment, please call your pharmacy.

## 2017-08-31 ENCOUNTER — Other Ambulatory Visit: Payer: Self-pay | Admitting: Nurse Practitioner

## 2017-08-31 ENCOUNTER — Ambulatory Visit (INDEPENDENT_AMBULATORY_CARE_PROVIDER_SITE_OTHER): Payer: Medicare PPO

## 2017-08-31 ENCOUNTER — Other Ambulatory Visit: Payer: Self-pay

## 2017-08-31 DIAGNOSIS — I255 Ischemic cardiomyopathy: Secondary | ICD-10-CM

## 2017-09-07 ENCOUNTER — Encounter: Payer: Self-pay | Admitting: *Deleted

## 2017-09-11 ENCOUNTER — Encounter: Payer: Self-pay | Admitting: *Deleted

## 2017-09-14 ENCOUNTER — Telehealth: Payer: Self-pay | Admitting: Nurse Practitioner

## 2017-09-14 NOTE — Telephone Encounter (Signed)
The patient's son, Tammy S[facility]Greg [facility](PO[facility]) called back in response to a letter we had mailed the patient about his echo results. We had been unable to reach the patient by phone. Echo results given- Tammy SoursGreg was aware to have the patient keep his follow up on 5/1 with Dr. Okey DupreEnd as planned.

## 2017-09-18 ENCOUNTER — Telehealth: Payer: Self-pay | Admitting: *Deleted

## 2017-09-18 MED ORDER — CARVEDILOL 3.125 MG PO TABS: 3.1250 mg | ORAL_TABLET | Freq: Two times a day (BID) | ORAL | 1 refills | Status: DC

## 2017-09-18 MED ORDER — ATORVASTATIN CALCIUM 40 MG PO TABS: 40.0000 mg | ORAL_TABLET | Freq: Every day | ORAL | 1 refills | Status: DC

## 2017-09-18 MED ORDER — CLOPIDOGREL BISULFATE 75 MG PO TABS: 75.0000 mg | ORAL_TABLET | Freq: Every day | ORAL | 1 refills | Status: DC

## 2017-09-18 NOTE — Telephone Encounter (Signed)
Received incoming call from patient's son and POA. He is the one who is receiving patient's mail and got the letter regarding patient's lab work which stated to stop the amidarone. Patient is now in a nursing home. He verbalized understanding of the results and plan of care from scanned lab work from 08/10/17. He is aware patient's upcoming appointment in May. Advised him that patient will need to sign DPR at that office visit. He also said patient's insurance has changed and [facility]. Refills for atorvastatin, carvedilol and clopidogrel sent to pharmacy. Amiodarone discontinued from patient's list.

## 2017-12-05 ENCOUNTER — Encounter: Payer: Self-pay | Admitting: Internal Medicine

## 2017-12-05 ENCOUNTER — Ambulatory Visit (INDEPENDENT_AMBULATORY_CARE_PROVIDER_SITE_OTHER): Payer: Medicare HMO | Admitting: Internal Medicine

## 2017-12-05 VITALS — BP 110/60 | HR 63 | Ht 69.0 in | Wt 168.0 lb

## 2017-12-05 DIAGNOSIS — I2510 Atherosclerotic heart disease of native coronary artery without angina pectoris: Secondary | ICD-10-CM | POA: Diagnosis not present

## 2017-12-05 DIAGNOSIS — E785 Hyperlipidemia, unspecified: Secondary | ICD-10-CM | POA: Diagnosis not present

## 2017-12-05 DIAGNOSIS — I255 Ischemic cardiomyopathy: Secondary | ICD-10-CM | POA: Diagnosis not present

## 2017-12-05 DIAGNOSIS — I5022 Chronic systolic (congestive) heart failure: Secondary | ICD-10-CM

## 2017-12-05 DIAGNOSIS — I480 Paroxysmal atrial fibrillation: Secondary | ICD-10-CM

## 2017-12-05 MED ORDER — FUROSEMIDE 20 MG PO TABS: 20.0000 mg | ORAL_TABLET | Freq: Every day | ORAL | 3 refills | Status: DC

## 2017-12-05 NOTE — Patient Instructions (Addendum)
Medication Instructions:  Your physician has recommended you make the following change in your medication:  1- START Furosemide 20 mg (1 tablet) by mouth once a day.   Labwork: Your physician recommends that you return for lab work in: 2 weeks at appointment here in the office.   Labwork will be requested from your primary care physician.    Testing/Procedures: none  Follow-Up: Your physician recommends that you schedule a follow-up appointment in: 2 WEEKS WITH CHRIS OR RYAN.   If you need a refill on your cardiac medications before your next appointment, please call your pharmacy.

## 2017-12-05 NOTE — Progress Notes (Signed)
Follow-up Outpatient Visit Date: 12/05/2017  Primary Care Provider: Jaclyn Shaggy, MD [address]   [place] [address]  Chief Complaint: Follow-up heart failure and coronary artery disease  HPI:  Raymond Rios is a 84 y.o. year-old male with history of coronary artery disease with late-presenting anterior STEMI managed medically in 05/2017, chronic systolic heart failure due to ischemic cardiomyopathy, paroxysmal atrial fibrillation, hypertension, hyperlipidemia, COPD, and myasthenia gravis, who presents for follow-up of CAD and CHF.  Raymond Rios was last seen in January by Ward Givens, NP, at which time he complained only if intermittent lightheadedness.  Today, Raymond Rios reports doing well.  He denies further lightheadedness, as well as chest pain, shortness of breath, palpitations, and edema.  He denies orthopnea but notes that he sometimes finds himself coughing when lying flat.  He denies side effects from his medications, including bleeding related to aspirin and clopidogrel.  He has not had any falls.  He has gained some weight since his prior visit and attributes this to being sedentary and dietary choices.  --------------------------------------------------------------------------------------------------  Past Medical History:  Diagnosis Date  . Allergy   . Anemia   . Arthritis   . CAD (coronary artery disease)    a. late presenting anterior STEMI 05/29/17 Cath: thrombotic occlusion of pLAD, dominant LCx, OM3 50%, lpda 80%, ant & apical AK with EF 35%.    . Depression   . GERD (gastroesophageal reflux disease)   . History of chickenpox   . History of colon polyps   . Hyperlipidemia   . Hypertension   . Ischemic cardiomyopathy    a. late presenting anterior STEMI 05/29/2017, TTE 05/30/17: EF 30-35%, AK of the mid-apicalanteroseptal, anterior, antlat, & apical wall, Gr1DD  . Myasthenia gravis (HCC)   . PAF (paroxysmal atrial fibrillation) (HCC)    a, newly diagnosed  06/01/2017; b. CHADS2VASC => 5 (CHF, HTN, age x 2, vascular disease)-->No OAC in the setting of ongoing ASA/Plavix, mild dementia, lives alone.  On Amio.  Marland Kitchen Urine incontinence    Past Surgical History:  Procedure Laterality Date  . CHOLECYSTECTOMY    . LEFT HEART CATH AND CORONARY ANGIOGRAPHY N/A 05/29/2017   Procedure: LEFT HEART CATH AND CORONARY ANGIOGRAPHY;  Surgeon: Yvonne Kendall, MD;  Location: [facility];  Service: Cardiovascular;  Laterality: N/A;  . TONSILLECTOMY AND ADENOIDECTOMY      Current Meds  Medication Sig  . aspirin 81 MG chewable tablet Chew 1 tablet (81 mg total) daily by mouth. (Patient taking differently: Chew 81 mg by mouth daily as needed. )  . atorvastatin (LIPITOR) 40 MG tablet Take 1 tablet (40 mg total) by mouth daily at 6 PM.  . carvedilol (COREG) 3.125 MG tablet Take 1 tablet (3.125 mg total) by mouth 2 (two) times daily with a meal.  . clopidogrel (PLAVIX) 75 MG tablet Take 1 tablet (75 mg total) by mouth daily with breakfast.  . levothyroxine (SYNTHROID, LEVOTHROID) 75 MCG tablet Take 75 mcg by mouth every morning.  . tamsulosin (FLOMAX) 0.4 MG CAPS capsule Take 0.4 mg by mouth daily.  Marland Kitchen zoster vaccine live, PF, (ZOSTAVAX) 57846 UNT/0.65ML injection Inject 19,400 Units into the skin once.    Allergies: Sulfonamide derivatives  Social History   Tobacco Use  . Smoking status: Former Games developer  . Smokeless tobacco: Former Neurosurgeon  . Tobacco comment: quit around 1970's  Substance Use Topics  . Alcohol use: No  . Drug use: No    Family History  Problem Relation Age of Onset  . Hyperlipidemia Mother   . Arthritis Father   . Hyperlipidemia Father     Review of Systems: A 12-system review of systems was performed and was negative except as noted in the HPI.  --------------------------------------------------------------------------------------------------  Physical Exam: BP 110/60 (BP Location: Left Arm, Patient Position: Sitting, Cuff  Size: Normal)   Pulse 63   Ht  (1.753 m)   Wt 168 lb (76.2 kg)   BMI 24.81 kg/m   General:  NAD.  Accompanied by his son. HEENT: No conjunctival pallor or scleral icterus. Moist mucous membranes.  OP clear. Neck: Supple without lymphadenopathy, thyromegaly, JVD, or HJR. Lungs: Normal work of breathing. Clear to auscultation bilaterally without wheezes or crackles. Heart: Regular rate and rhythm without murmurs, rubs, or gallops. Non-displaced PMI. Abd: Bowel sounds present. Soft, NT/ND without hepatosplenomegaly Ext: 1+ pitting edema to the proximal calves bilaterally. Skin: Warm and dry without rash.  EKG:  NSR with lateral T-wave inversions  Lab Results  Component Value Date   WBC 8.5 06/01/2017   HGB 11.2 (L) 06/01/2017   HCT 33.4 (L) 06/01/2017   MCV 86.1 06/01/2017   PLT 112 (L) 06/01/2017    Lab Results  Component Value Date   NA 134 (L) 05/31/2017   K 3.9 05/31/2017   CL 102 05/31/2017   CO2 26 05/31/2017   BUN 22 (H) 05/31/2017   CREATININE 0.97 05/31/2017   GLUCOSE 105 (H) 05/31/2017   ALT 57 05/29/2017    Lab Results  Component Value Date   CHOL 89 05/30/2017   HDL 46 05/30/2017   LDLCALC 34 05/30/2017   LDLDIRECT 190.3 08/23/2009   TRIG 44 05/30/2017   CHOLHDL 1.9 05/30/2017    --------------------------------------------------------------------------------------------------  ASSESSMENT AND PLAN: Coronary artery disease without angina No chest pain reported since late-presenting STEMI in 05/2017.  We will complete 12 months of DAPT followed by indefinite aspirin therapy.  We will also continue carvedilol and atorvastatin for secondary prevention.  Chronic systolic heart failure due to ischemic cardiomyopathy Raymond Rios denies symptoms of heart failure, though he has modest calf edema on exam in the setting of a 10 pound weight gain since January and 15 pound weight gain since last November.  I wonder if his nocturnal cough could be a variant of  orthopnea.  We have agreed to start furosemide 20 mg daily.  Raymond Rios reports recent labs by Dr. Arlana Pouch, which we will request today.  I will have Raymond Rios return in 2 weeks to reassess his volume status as well as to check his renal function and potassium.  Addition of an ACEI/ARB should be considered at that time, as blood pressure and renal function/potassium allow.  We also discussed risks and benefits of ICD placement in the setting of persistent LVEF<35% by repeat echo in January.  Raymond Rios does not wish to pursue this.  Hyperlipidemia LDL well-controlled.  We will continue with atorvastatin 40 mg daily.  Paroxysmal atrial fibrillation Brief episode noted during hospitalization at time of STEMI.  No symptoms or EKG evidence since.  Anticoagulation has been deferred thus far in the setting of DAPT as well as dementia and history of falls in the past.  Follow-up: Return to clinic in 2 weeks to reassess volume status and for labs.  Yvonne Kendall, MD 12/05/2017 2:48 PM

## 2017-12-06 ENCOUNTER — Encounter: Payer: Self-pay | Admitting: Internal Medicine

## 2017-12-06 DIAGNOSIS — I480 Paroxysmal atrial fibrillation: Secondary | ICD-10-CM | POA: Insufficient documentation

## 2017-12-06 DIAGNOSIS — I5022 Chronic systolic (congestive) heart failure: Secondary | ICD-10-CM | POA: Insufficient documentation

## 2017-12-06 DIAGNOSIS — I255 Ischemic cardiomyopathy: Secondary | ICD-10-CM | POA: Insufficient documentation

## 2017-12-06 DIAGNOSIS — I2510 Atherosclerotic heart disease of native coronary artery without angina pectoris: Secondary | ICD-10-CM | POA: Insufficient documentation

## 2017-12-06 HISTORY — DX: Paroxysmal atrial fibrillation: I48.0

## 2017-12-06 HISTORY — DX: Atherosclerotic heart disease of native coronary artery without angina pectoris: I25.10

## 2017-12-17 ENCOUNTER — Ambulatory Visit (INDEPENDENT_AMBULATORY_CARE_PROVIDER_SITE_OTHER): Payer: Medicare HMO

## 2017-12-17 DIAGNOSIS — R55 Syncope and collapse: Secondary | ICD-10-CM | POA: Diagnosis not present

## 2017-12-19 ENCOUNTER — Ambulatory Visit
Admission: RE | Admit: 2017-12-19 | Discharge: 2017-12-19 | Disposition: A | Discharge status: Home / Self Care | Payer: Medicare HMO | Source: Ambulatory Visit | Attending: Internal Medicine | Admitting: Internal Medicine

## 2017-12-19 ENCOUNTER — Other Ambulatory Visit: Payer: Self-pay | Admitting: Internal Medicine

## 2017-12-19 DIAGNOSIS — M19011 Primary osteoarthritis, right shoulder: Secondary | ICD-10-CM | POA: Diagnosis not present

## 2017-12-19 DIAGNOSIS — M25511 Pain in right shoulder: Secondary | ICD-10-CM

## 2017-12-19 DIAGNOSIS — W19XXXA Unspecified fall, initial encounter: Secondary | ICD-10-CM

## 2017-12-25 ENCOUNTER — Ambulatory Visit (INDEPENDENT_AMBULATORY_CARE_PROVIDER_SITE_OTHER): Payer: Medicare HMO | Admitting: Nurse Practitioner

## 2017-12-25 ENCOUNTER — Encounter: Payer: Self-pay | Admitting: Nurse Practitioner

## 2017-12-25 ENCOUNTER — Other Ambulatory Visit: Payer: Medicare HMO

## 2017-12-25 VITALS — BP 120/70 | HR 63 | Ht 66.0 in | Wt 165.5 lb

## 2017-12-25 DIAGNOSIS — R55 Syncope and collapse: Secondary | ICD-10-CM | POA: Diagnosis not present

## 2017-12-25 DIAGNOSIS — I2510 Atherosclerotic heart disease of native coronary artery without angina pectoris: Secondary | ICD-10-CM

## 2017-12-25 DIAGNOSIS — I5022 Chronic systolic (congestive) heart failure: Secondary | ICD-10-CM | POA: Diagnosis not present

## 2017-12-25 DIAGNOSIS — I255 Ischemic cardiomyopathy: Secondary | ICD-10-CM | POA: Diagnosis not present

## 2017-12-25 NOTE — Progress Notes (Signed)
Office Visit    Patient Name: Raymond Rios Date of Encounter: 12/25/2017  Primary Care Provider:  Albina Billet, MD Primary Cardiologist:  Nelva Bush, MD  Chief Complaint    84 year old male with history of CAD status post late presenting anterior STEMI in October 2018 with finding of occluded LAD, ischemic cardiomyopathy/HFrEF, paroxysmal atrial fibrillation, hypertension, hyperlipidemia, COPD, and myasthenia gravis, who presents for follow-up after recent syncopal episode.  Past Medical History    Past Medical History:  Diagnosis Date  . (HFpEF) heart failure with preserved ejection fraction ([place])   . Allergy   . Anemia   . Arthritis   . CAD (coronary artery disease)    a. late presenting anterior STEMI 05/29/17 Cath: thrombotic occlusion of pLAD, dominant LCx, OM3 50%, lpda 80%, ant & apical AK with EF 35%.    . Depression   . GERD (gastroesophageal reflux disease)   . History of chickenpox   . History of colon polyps   . Hyperlipidemia   . Hypertension   . Ischemic cardiomyopathy    a. late presenting anterior STEMI 05/29/2017, TTE 05/30/17: EF 30-35%, AK of the mid-apicalanteroseptal, anterior, antlat, & apical wall, Gr1DD; c. 08/2017 Echo: EF 30-35%, mid-apicalanteroseptal, ant, apical AK, Gr1 DD.  . Myasthenia gravis ([place])   . PAF (paroxysmal atrial fibrillation) ([place])    a, newly diagnosed 06/01/2017; b. CHADS2VASC => 5 (CHF, HTN, age x 2, vascular disease)-->No OAC in the setting of ongoing ASA/Plavix, mild dementia, lives alone.  On Amio.  Marland Kitchen Urine incontinence    Past Surgical History:  Procedure Laterality Date  . CHOLECYSTECTOMY    . LEFT HEART CATH AND CORONARY ANGIOGRAPHY N/A 05/29/2017   Procedure: LEFT HEART CATH AND CORONARY ANGIOGRAPHY;  Surgeon: Nelva Bush, MD;  Location: [place];  Service: Cardiovascular;  Laterality: N/A;  . TONSILLECTOMY AND ADENOIDECTOMY      Allergies  Allergies  Allergen Reactions  . Other Hives  .  Sulfonamide Derivatives     REACTION: unspecified    History of Present Illness    84 year old male with the above complex past medical history including hypertension, hyperlipidemia, COPD, and myasthenia gravis.  In October 2018, he was admitted with late presenting anterior STEMI.  Echo showed EF of 30 to 35%.  Catheterization showed a thrombotic occlusion of the LAD and otherwise nonobstructive disease.  Medical therapy was recommended given late presentation and absence of chest pain.  During hospitalization, he was noted to have paroxysmal atrial fibrillation and he was placed on amiodarone therapy with conversion to sinus rhythm.  Oral anticoagulation was not initiated as he was already on dual antiplatelet therapy, has dementia, and lives alone.  Follow-up echocardiogram in January 2019 showed persistent LV dysfunction with an EF of 30 to 35%.  Patient was last seen in clinic on May 1 and decision was made not to pursue ICD therapy.  He was also volume overloaded on that visit with his weight being up to 10 pounds from previous visit and 15 pounds from late last year.  He was placed on Lasix 20 mg daily with a plan for follow-up basic metabolic panel today.  Unfortunately, on May 15, he had awakened as usual and went into his bathroom where he was getting ready for breakfast and was about to take his medicines when he had sudden onset of lightheadedness which was immediately followed by syncope.  He struck his right shoulder on the floor and had significant discomfort.  He  does not know how long he was without consciousness but thinks it was a brief time.  By chance, someone had come to his room and helped him up.  He later saw his primary care provider and underwent x-rays of his shoulder which did not show any acute trauma.  He has continued to have shoulder pain and limitation on  internal and external rotation.  He has been advised that if things do not improve, he may require orthopedic referral.   He has not had any recurrent presyncope or syncope.  His weight is down 3 pounds today and he says that since he had the syncopal episode, he has not been taking Lasix.  He denies chest pain, dyspnea, PND, orthopnea,  edema, or early satiety.  He has been walking regularly and will even intentionally walk up and down 2 flights of stairs several times a day.  He has been tolerating this well.  Home Medications    Prior to Admission medications   Medication Sig Start Date End Date Taking? Authorizing Provider  aspirin 81 MG chewable tablet Chew 1 tablet (81 mg total) daily by mouth. Patient taking differently: Chew 81 mg by mouth daily as needed.  06/13/17  Yes Theora Gianotti, NP  atorvastatin (LIPITOR) 40 MG tablet Take 1 tablet (40 mg total) by mouth daily at 6 PM. 09/18/17  Yes End, Harrell Gave, MD  carvedilol (COREG) 3.125 MG tablet Take 1 tablet (3.125 mg total) by mouth 2 (two) times daily with a meal. 09/18/17  Yes End, Harrell Gave, MD  clopidogrel (PLAVIX) 75 MG tablet Take 1 tablet (75 mg total) by mouth daily with breakfast. 09/18/17  Yes End, Harrell Gave, MD  furosemide (LASIX) 20 MG tablet Take 1 tablet (20 mg total) by mouth daily. 12/05/17 03/05/18 Yes End, Harrell Gave, MD  levothyroxine (SYNTHROID, LEVOTHROID) 75 MCG tablet Take 75 mcg by mouth every morning. 03/23/17  Yes [provider]  PARoxetine (PAXIL) 10 MG tablet Take 10 mg by mouth daily.   Yes [provider]  tamsulosin (FLOMAX) 0.4 MG CAPS capsule Take 0.4 mg by mouth daily.   Yes [provider]  zoster vaccine live, PF, (ZOSTAVAX) 23536 UNT/0.65ML injection Inject 19,400 Units into the skin once. 02/16/14  Yes Rey, Latina Craver, NP    Review of Systems    Presyncope and syncope as outlined above.  He denies chest pain, dyspnea, palpitations, PND, orthopnea, edema, or early satiety.  All other systems reviewed and are otherwise negative except as noted above.  Physical Exam    VS:  BP 120/70  (BP Location: Left Arm, Patient Position: Sitting, Cuff Size: Normal)   Pulse 63   Ht _0  (1.676 m)   Wt 165 lb 8 oz (75.1 kg)   BMI 26.71 kg/m  , BMI Body mass index is 26.71 kg/m. GEN: Well nourished, well developed, in no acute distress.  HEENT: normal.  Neck: Supple, no JVD, carotid bruits, or masses. Cardiac: RRR, no murmurs, rubs, or gallops. No clubbing, cyanosis, trace bilateral lower extremity edema at the level of the calf.  No significant ankle edema.  Radials/PT 2+ and equal bilaterally.  Respiratory:  Respirations regular and unlabored, bibasilar crackles. GI: Soft, nontender, nondistended, BS + x 4. MS: no deformity or atrophy. Skin: warm and dry, no rash. Neuro:  Strength and sensation are intact. Psych: Normal affect.  Accessory Clinical Findings    ECG -regular sinus rhythm, 63, prior septal infarct, no acute ST or T changes.  Assessment &  Plan    1.  Syncope: Patient recently suffered a syncopal episode while standing in his bathroom.  He suffered right shoulder pain and still has trouble moving his shoulder.  X-rays were negative.  Symptoms were preceded by lightheadedness which  was abrupt in onset.  Given LV dysfunction, he is at risk for ventricular arrhythmias.  His son is with him today and we have all agreed that placing a 30-day event monitor would be appropriate.  2.  Ischemic cardiomyopathy/HFrEF: EF 30 to 35% by echo in January 2019 on maximally tolerated medical therapy which at this point is low-dose beta-blocker.  He was placed on Lasix at his last visit but has been off of this for 6 days following a syncopal spell.  I will check a basic metabolic panel today.  Blood pressures historically soft the recently improved.  With recent syncopal spell in the setting of addition of Lasix at his last visit, I am reluctant to initiate an ACE/ARB today.  If renal function stable, given mild edema on exam with weight still above where it was in January, I will  recommend resumption of Lasix 10 mg daily.  Interestingly, he has not had any significant dyspnea and remains reasonably active.  As previously noted, he is not interested in ICD placement.  3.  Coronary artery disease: Known occlusion of the LAD, which has been medically managed since his late presenting anterior STEMI in October 2018.  He has not been having any chest pain or dyspnea.  He remains on aspirin, statin, beta-blocker, and Plavix.  4.  Paroxysmal atrial fibrillation: Noted during hospitalization at time of anterior STEMI.  No known recurrence of A. fib or palpitations.  CHA2DS2VASc equals 5.  He is not oral anticoagulation in the setting of dual and platelet therapy with concerns regarding dementia and falls.  5.  Hyperlipidemia: LDL 34 in October 2018.  Continue statin therapy.  6.  Disposition: Follow-up basic met about panel today.  Plan to follow-up 30-day event monitor with office follow-up in the next 4 to 6 weeks.  Murray Hodgkins, NP 12/25/2017, 12:45 PM

## 2017-12-25 NOTE — Patient Instructions (Signed)
Medication Instructions:  Your physician recommends that you continue on your current medications as directed. Please refer to the Current Medication list given to you today.   Labwork: Your physician recommends that you return for lab work in: TODAY (BMET, Mg).    Testing/Procedures: Your physician has recommended that you wear an 30 DAY [employer] event monitor. Event monitors are medical devices that record the heart's electrical activity. Doctors most often [place] these monitors to diagnose arrhythmias. Arrhythmias are problems with the speed or rhythm of the heartbeat. The monitor is a small, portable device. You can wear one while you do your normal daily activities. This is usually used to diagnose what is causing palpitations/syncope (passing out). - You will be mailed a monitor from [employer].  - They will call you in the next day or so to verify your address. Then is will take 5-7 days to be mailed to you. - You will wear for 30 days and then place all the pieces of equipment that came with the device back in the provided box and take it to your nearest UPS drop off locations. - Call [employer] at [phone], if you have any questions concerning the monitor once you have received it. - DO NOT GET THE MONITOR WET.    Follow-Up: Your physician recommends that you schedule a follow-up appointment in: ABOUT 6 WEEKS WITH DR END OR CHRIS.  If you need a refill on your cardiac medications before your next appointment, please call your pharmacy.

## 2017-12-26 LAB — BASIC METABOLIC PANEL
BUN / CREAT RATIO: 18 (ref 10–24)
BUN: 17 mg/dL (ref 8–27)
CO2: 26 mmol/L (ref 20–29)
Calcium: 9 mg/dL (ref 8.6–10.2)
Chloride: 104 mmol/L (ref 96–106)
Creatinine, Ser: 0.97 mg/dL (ref 0.76–1.27)
GFR, EST AFRICAN AMERICAN: 83 mL/min/{1.73_m2} (ref >59)
GFR, EST NON AFRICAN AMERICAN: 71 mL/min/{1.73_m2} (ref >59)
Glucose: 98 mg/dL (ref 65–99)
Potassium: 5 mmol/L (ref 3.5–5.2)
SODIUM: 142 mmol/L (ref 134–144)

## 2017-12-26 LAB — MAGNESIUM: Magnesium: 1.9 mg/dL (ref 1.6–2.3)

## 2017-12-27 ENCOUNTER — Other Ambulatory Visit: Payer: Self-pay | Admitting: *Deleted

## 2017-12-27 MED ORDER — FUROSEMIDE 20 MG PO TABS: 10.0000 mg | ORAL_TABLET | Freq: Every day | ORAL | 3 refills | Status: DC

## 2018-02-11 ENCOUNTER — Ambulatory Visit: Payer: Medicare HMO | Admitting: Nurse Practitioner

## 2018-02-11 ENCOUNTER — Encounter: Payer: Self-pay | Admitting: Nurse Practitioner

## 2018-02-11 VITALS — BP 110/70 | HR 70 | Ht 70.0 in | Wt 167.5 lb

## 2018-02-11 DIAGNOSIS — I5022 Chronic systolic (congestive) heart failure: Secondary | ICD-10-CM

## 2018-02-11 DIAGNOSIS — E785 Hyperlipidemia, unspecified: Secondary | ICD-10-CM

## 2018-02-11 DIAGNOSIS — R55 Syncope and collapse: Secondary | ICD-10-CM | POA: Diagnosis not present

## 2018-02-11 DIAGNOSIS — I480 Paroxysmal atrial fibrillation: Secondary | ICD-10-CM

## 2018-02-11 DIAGNOSIS — I255 Ischemic cardiomyopathy: Secondary | ICD-10-CM

## 2018-02-11 DIAGNOSIS — I2510 Atherosclerotic heart disease of native coronary artery without angina pectoris: Secondary | ICD-10-CM

## 2018-02-11 MED ORDER — CARVEDILOL 3.125 MG PO TABS: 3.1250 mg | ORAL_TABLET | Freq: Two times a day (BID) | ORAL | 2 refills | Status: DC

## 2018-02-11 MED ORDER — CLOPIDOGREL BISULFATE 75 MG PO TABS: 75.0000 mg | ORAL_TABLET | Freq: Every day | ORAL | 2 refills | Status: DC

## 2018-02-11 NOTE — Progress Notes (Signed)
Office Visit    Patient Name: Raymond Rios Date of Encounter: 02/11/2018  Primary Care Provider:  Jaclyn Shaggy, MD Primary Cardiologist:  Yvonne Kendall, MD  Chief Complaint    84 year old male with a history of CAD status post late presenting anterior STEMI in October 2018 with finding of an occluded LAD, ischemic cardiomyopathy/HFrEF, paroxysmal atrial for ablation, hypertension, hyperlipidemia, COPD, and myasthenia gravis, who presents for follow-up.  Past Medical History    Past Medical History:  Diagnosis Date  . (HFpEF) heart failure with preserved ejection fraction (HCC)   . Allergy   . Anemia   . Arthritis   . CAD (coronary artery disease)    a. late presenting anterior STEMI 05/29/17 Cath: thrombotic occlusion of pLAD, dominant LCx, OM3 50%, lpda 80%, ant & apical AK with EF 35%.    . Depression   . GERD (gastroesophageal reflux disease)   . History of chickenpox   . History of colon polyps   . Hyperlipidemia   . Hypertension   . Ischemic cardiomyopathy    a. late presenting anterior STEMI 05/29/2017, TTE 05/30/17: EF 30-35%, AK of the mid-apicalanteroseptal, anterior, antlat, & apical wall, Gr1DD; c. 08/2017 Echo: EF 30-35%, mid-apicalanteroseptal, ant, apical AK, Gr1 DD.  . Myasthenia gravis (HCC)   . PAF (paroxysmal atrial fibrillation) (HCC)    a, newly diagnosed 06/01/2017; b. CHADS2VASC => 5 (CHF, HTN, age x 2, vascular disease)-->No OAC in the setting of ongoing ASA/Plavix, mild dementia, lives alone.  On Amio.  Marland Kitchen Urine incontinence    Past Surgical History:  Procedure Laterality Date  . CHOLECYSTECTOMY    . LEFT HEART CATH AND CORONARY ANGIOGRAPHY N/A 05/29/2017   Procedure: LEFT HEART CATH AND CORONARY ANGIOGRAPHY;  Surgeon: Yvonne Kendall, MD;  Location: [facility] INVASIVE CV LAB;  Service: Cardiovascular;  Laterality: N/A;  . TONSILLECTOMY AND ADENOIDECTOMY      Allergies  Allergies  Allergen Reactions  . Other Hives  . Sulfonamide Derivatives    REACTION: unspecified    History of Present Illness    84 year old male with the above complex past medical history including hypertension, hyperlipidemia, COPD, and myasthenia gravis.  In October 2018, he was admitted with late presenting anterior STEMI.  Echo showed EF of 30 to 35%.  Catheterization showed a thrombotic occlusion of the LAD and otherwise nonobstructive disease.  Medical therapy was recommended given late presentation and absence of chest pain.  During hospitalization, he was noted to have paroxysmal atrial fibrillation and was placed on amiodarone therapy with conversion to sinus rhythm.  Oral anti-coag elation was not initiated as he was already on dual anti-platelet therapy, has dementia, and lives alone.  Follow-up echo in January 2019 showed persistent LV dysfunction with an EF of 30 to 35%.  He has previously declined ICD therapy.  He was seen in clinic in May following an episode of syncope that occurred May 15 which was sudden in nature.  He has since had right shoulder pain and is following up with primary care and orthopedics.  He wore an event monitor for the month of June and this revealed 2 brief episodes (5 beats) of nonsustained VT.  Otherwise there were no sustained arrhythmias or prolonged pauses.  He was asymptomatic during the monitoring period.  Since his last visit, he has done well.  He does have some fatigue with prolonged periods of activity but in general, does not experience chest pain, palpitations, dyspnea, PND, orthopnea, dizziness, syncope, or early satiety.  He sometimes has mild ankle swelling, especially if he forgets to take his Lasix for a few days in a row.  Home Medications    Prior to Admission medications   Medication Sig Start Date End Date Taking? Authorizing Provider  aspirin 81 MG chewable tablet Chew 1 tablet (81 mg total) daily by mouth. Patient taking differently: Chew 81 mg by mouth daily as needed.  06/13/17  Yes Creig HinesBerge, Eliska Hamil Ronald,  NP  atorvastatin (LIPITOR) 40 MG tablet Take 1 tablet (40 mg total) by mouth daily at 6 PM. 09/18/17  Yes End, Cristal Deerhristopher, MD  carvedilol (COREG) 3.125 MG tablet Take 1 tablet (3.125 mg total) by mouth 2 (two) times daily with a meal. 09/18/17  Yes End, Cristal Deerhristopher, MD  clopidogrel (PLAVIX) 75 MG tablet Take 1 tablet (75 mg total) by mouth daily with breakfast. 09/18/17  Yes End, Cristal Deerhristopher, MD  furosemide (LASIX) 20 MG tablet Take 0.5 tablets (10 mg total) by mouth daily. Patient taking differently: Take 20 mg by mouth daily.  12/27/17 03/27/18 Yes Creig HinesBerge, Ivy Meriwether Ronald, NP  levothyroxine (SYNTHROID, LEVOTHROID) 75 MCG tablet Take 75 mcg by mouth every morning. 03/23/17  Yes [provider]  PARoxetine (PAXIL) 10 MG tablet Take 10 mg by mouth daily.   Yes [provider]  tamsulosin (FLOMAX) 0.4 MG CAPS capsule Take 0.4 mg by mouth daily.   Yes [provider]  zoster vaccine live, PF, (ZOSTAVAX) [id number] UNT/0.65ML injection Inject 19,400 Units into the skin once. 02/16/14  Yes Rey, Richarda Overlieaquel M, NP    Review of Systems    Overall doing well.  No recurrent syncope/presyncope.  Mild ankle swelling based on whether or not he takes Lasix.  He denies chest pain, palpitations, PND, orthopnea, dizziness, or early satiety.  All other systems reviewed and are otherwise negative except as noted above.  Physical Exam    VS:  BP 110/70 (BP Location: Left Arm, Patient Position: Sitting, Cuff Size: Normal)   Pulse 70   Ht 5\' 10"  (1.778 m)   Wt 167 lb 8 oz (76 kg)   BMI 24.03 kg/m  , BMI Body mass index is 24.03 kg/m. GEN: Well nourished, well developed, in no acute distress.  HEENT: normal.  Neck: Supple, no JVD, carotid bruits, or masses. Cardiac: RRR, no murmurs, rubs, or gallops. No clubbing, cyanosis, trace bimalleolar edema.  Radials/DP/PT 2+ and equal bilaterally.  Respiratory:  Respirations regular and unlabored, clear to auscultation bilaterally. GI: Soft, nontender,  nondistended, BS + x 4. MS: no deformity or atrophy. Skin: warm and dry, no rash. Neuro:  Strength and sensation are intact. Psych: Normal affect.  Accessory Clinical Findings    ECG -regular sinus rhythm, 70, left axis deviation, nonspecific T changes.  Assessment & Plan    1.  Syncope: Patient suffered a syncopal episode while standing in his bathroom in May.  He has had some shoulder pain is being evaluated by primary care and Ortho.  Recent event monitoring did not show any sustained arrhythmias or prolonged pauses.  He did have 2 episodes of 5 beats of nonsustained VT.  No recurrent presyncope or syncope.  Continue beta-blocker therapy.  He is not interested in ICD placement.  2.  Ischemic cardiomyopathy/HFrEF: EF 30 to 35% in January 2019 on maximally tolerated medical therapy which has been limited to low-dose beta-blocker in the setting of soft blood pressures.  As above, he is not interested in EP referral and advanced age makes him a poor candidate for ICD  regardless.  He is relatively euvolemic on exam.  He is supposed to be taking Lasix 20 mg daily but sometimes forgets to do this and will have mild ankle edema sometimes.  I will follow-up a basic metabolic panel today.  3.  Coronary artery disease: Known occlusion of the LAD, which is been medically managed since late presenting anterior STEMI in October 2018.  No chest pain.  Continue aspirin, statin, beta-blocker, and Plavix.  4.  Paroxysmal atrial fibrillation: Noted during hospitalization at time of anterior STEMI.  No known recurrence.  CHA2DS2VASc equals 5.  He is not on oral anticoagulations setting of dual antiplatelet therapy with concerns regarding dementia and falls.  5.  Hyperlipidemia: LDL 34 in October 2018.  Continue statin therapy.  6.  Disposition: Follow-up basic metabolic panel as he has been taking more Lasix than previous prescribed-20 instead of 10.  Follow-up in clinic in 3 months or sooner if  necessary.   Nicolasa Ducking, NP 02/11/2018, 5:56 PM

## 2018-02-11 NOTE — Patient Instructions (Signed)
Medication Instructions:  Your physician recommends that you continue on your current medications as directed. Please refer to the Current Medication list given to you today.   Labwork: Your physician recommends that you return for lab work in: today Designer, jewellery(BMET).   Testing/Procedures: none  Follow-Up: Your physician recommends that you schedule a follow-up appointment in: 3 MONTHS WITH DR END.  If you need a refill on your cardiac medications before your next appointment, please call your pharmacy.

## 2018-02-12 LAB — BASIC METABOLIC PANEL
BUN/Creatinine Ratio: 21 (ref 10–24)
BUN: 21 mg/dL (ref 8–27)
CALCIUM: 9.2 mg/dL (ref 8.6–10.2)
CHLORIDE: 103 mmol/L (ref 96–106)
CO2: 23 mmol/L (ref 20–29)
Creatinine, Ser: 0.99 mg/dL (ref 0.76–1.27)
GFR, EST AFRICAN AMERICAN: 81 mL/min/{1.73_m2} (ref >59)
GFR, EST NON AFRICAN AMERICAN: 70 mL/min/{1.73_m2} (ref >59)
Glucose: 92 mg/dL (ref 65–99)
Potassium: 4.2 mmol/L (ref 3.5–5.2)
Sodium: 140 mmol/L (ref 134–144)

## 2018-05-16 ENCOUNTER — Ambulatory Visit: Payer: Medicare HMO | Admitting: Internal Medicine

## 2018-05-16 ENCOUNTER — Encounter: Payer: Self-pay | Admitting: Internal Medicine

## 2018-05-16 VITALS — BP 120/72 | HR 66 | Ht 68.0 in | Wt 170.0 lb

## 2018-05-16 DIAGNOSIS — I255 Ischemic cardiomyopathy: Secondary | ICD-10-CM

## 2018-05-16 DIAGNOSIS — E785 Hyperlipidemia, unspecified: Secondary | ICD-10-CM

## 2018-05-16 DIAGNOSIS — R251 Tremor, unspecified: Secondary | ICD-10-CM

## 2018-05-16 DIAGNOSIS — I2510 Atherosclerotic heart disease of native coronary artery without angina pectoris: Secondary | ICD-10-CM | POA: Diagnosis not present

## 2018-05-16 DIAGNOSIS — I5022 Chronic systolic (congestive) heart failure: Secondary | ICD-10-CM | POA: Diagnosis not present

## 2018-05-16 HISTORY — DX: Tremor, unspecified: R25.1

## 2018-05-16 MED ORDER — ASPIRIN EC 81 MG PO TBEC: 81.0000 mg | DELAYED_RELEASE_TABLET | Freq: Every day | ORAL | 3 refills | Status: AC

## 2018-05-16 MED ORDER — FUROSEMIDE 20 MG PO TABS: ORAL_TABLET | ORAL | 3 refills | Status: DC

## 2018-05-16 NOTE — Progress Notes (Signed)
Follow-up Outpatient Visit Date: 05/16/2018  Primary Care Provider: Jaclyn Shaggy, MD [address]   [place] [address]  Chief Complaint: Follow-up coronary artery disease and chronic systolic heart failure  HPI:  Raymond Rios is a 84 y.o. year-old male with history of coronary artery disease with late-presenting anterior STEMI managed medically in 05/2017, chronic systolic heart failure due to ischemic cardiomyopathy, paroxysmal atrial fibrillation, hypertension, hyperlipidemia, COPD, and myasthenia gravis, who presents for follow-up of coronary artery disease and chronic systolic heart failure.  I last saw Raymond Rios in May, at which time he was doing well.  Later that month, he had a fall concerning for cardiogenic syncope (though patient denies passing out).  Subsequent event monitor showed 2 brief episodes of asymptomatic nonsustained ventricular tachycardia (5 beats).  He was doing well at that time.  No medication changes were made.  The patient again declined referral for ICD evaluation.  Today, Raymond Rios reports feeling about the same as at prior visits.  He denies chest pain, shortness of breath, palpitations, and edema.  He is concerned about chronic fatigue that has been present since last year.  He also notes frequent tremors.  He does not follow with a neurologist.  He has not fallen.  Due to easy bleeding with minor trauma, he has stopped taking aspirin.  He remains on clopidogrel.  He is currently using furosemide only once a week for leg swelling.  He reports that his weight has been stable.  --------------------------------------------------------------------------------------------------  Past Medical History:  Diagnosis Date  . Allergy   . Anemia   . Arthritis   . CAD (coronary artery disease)    a. late presenting anterior STEMI 05/29/17 Cath: thrombotic occlusion of pLAD, dominant LCx, OM3 50%, lpda 80%, ant & apical AK with EF 35%.    . Chronic systolic heart  failure (HCC)   . Depression   . GERD (gastroesophageal reflux disease)   . History of chickenpox   . History of colon polyps   . Hyperlipidemia   . Hypertension   . Ischemic cardiomyopathy    a. late presenting anterior STEMI 05/29/2017, TTE 05/30/17: EF 30-35%, AK of the mid-apicalanteroseptal, anterior, antlat, & apical wall, Gr1DD; c. 08/2017 Echo: EF 30-35%, mid-apicalanteroseptal, ant, apical AK, Gr1 DD.  . Myasthenia gravis (HCC)   . PAF (paroxysmal atrial fibrillation) (HCC)    a, newly diagnosed 06/01/2017; b. CHADS2VASC => 5 (CHF, HTN, age x 2, vascular disease)-->No OAC in the setting of ongoing ASA/Plavix, mild dementia, lives alone.  On Amio.  Marland Kitchen Urine incontinence    Past Surgical History:  Procedure Laterality Date  . CHOLECYSTECTOMY    . LEFT HEART CATH AND CORONARY ANGIOGRAPHY N/A 05/29/2017   Procedure: LEFT HEART CATH AND CORONARY ANGIOGRAPHY;  Surgeon: Yvonne Kendall, MD;  Location: [facility] INVASIVE CV LAB;  Service: Cardiovascular;  Laterality: N/A;  . TONSILLECTOMY AND ADENOIDECTOMY      Current Meds  Medication Sig  . aspirin 81 MG chewable tablet Chew 1 tablet (81 mg total) daily by mouth. (Patient taking differently: Chew 81 mg by mouth daily as needed. )  . atorvastatin (LIPITOR) 40 MG tablet Take 1 tablet (40 mg total) by mouth daily at 6 PM.  . carvedilol (COREG) 3.125 MG tablet Take 1 tablet (3.125 mg total) by mouth 2 (two) times daily with a meal.  . clopidogrel (PLAVIX) 75 MG tablet Take 1 tablet (75 mg total) by mouth daily with breakfast.  . furosemide (LASIX) 20  MG tablet Take 0.5 tablets (10 mg total) by mouth daily. (Patient taking differently: Take 20 mg by mouth daily. )  . levothyroxine (SYNTHROID, LEVOTHROID) 75 MCG tablet Take 75 mcg by mouth every morning.  Marland Kitchen PARoxetine (PAXIL) 10 MG tablet Take 10 mg by mouth daily.  . tamsulosin (FLOMAX) 0.4 MG CAPS capsule Take 0.4 mg by mouth daily.  Marland Kitchen zoster vaccine live, PF, (ZOSTAVAX) [id number] UNT/0.65ML  injection Inject 19,400 Units into the skin once.    Allergies: Other and Sulfonamide derivatives  Social History   Tobacco Use  . Smoking status: Former Games developer  . Smokeless tobacco: Former Neurosurgeon  . Tobacco comment: quit around 1970's  Substance Use Topics  . Alcohol use: No  . Drug use: No    Family History  Problem Relation Age of Onset  . Hyperlipidemia Mother   . Arthritis Father   . Hyperlipidemia Father     Review of Systems: A 12-system review of systems was performed and was negative except as noted in the HPI.  --------------------------------------------------------------------------------------------------  Physical Exam: BP 120/72 (BP Location: Left Arm, Patient Position: Sitting, Cuff Size: Normal)   Pulse 66   Ht 5\' 8"  (1.727 m)   Wt 170 lb (77.1 kg)   BMI 25.85 kg/m   General: NAD.  Accompanied by his son. HEENT: No conjunctival pallor or scleral icterus. Moist mucous membranes.  OP clear. Neck: Supple without lymphadenopathy, thyromegaly, JVD, or HJR. Lungs: Normal work of breathing.  Faint bibasilar crackles. Heart: Regular rate and rhythm without murmurs, rubs, or gallops. Non-displaced PMI. Abd: Bowel sounds present. Soft, NT/ND without hepatosplenomegaly Ext: 1+ ankle edema bilaterally. Skin: Warm and dry without rash.  EKG: Normal sinus rhythm with septal Q waves and nonspecific T wave changes.  Lab Results  Component Value Date   WBC 8.5 06/01/2017   HGB 11.2 (L) 06/01/2017   HCT 33.4 (L) 06/01/2017   MCV 86.1 06/01/2017   PLT 112 (L) 06/01/2017    Lab Results  Component Value Date   NA 140 02/11/2018   K 4.2 02/11/2018   CL 103 02/11/2018   CO2 23 02/11/2018   BUN 21 02/11/2018   CREATININE 0.99 02/11/2018   GLUCOSE 92 02/11/2018   ALT 57 05/29/2017    Lab Results  Component Value Date   CHOL 89 05/30/2017   HDL 46 05/30/2017   LDLCALC 34 05/30/2017   LDLDIRECT 190.3 08/23/2009   TRIG 44 05/30/2017   CHOLHDL 1.9  05/30/2017    --------------------------------------------------------------------------------------------------  ASSESSMENT AND PLAN: Coronary artery disease without angina No further chest pain reported by Raymond Rios.  He is almost a year out from his late-presenting STEMI that was treated medically.  I have recommended that he stopped taking clopidogrel when he has exhausted his current supply and begin taking aspirin 81 mg daily at that point.  We will continue current doses of carvedilol and atorvastatin for secondary prevention.  Chronic systolic heart failure due to ischemic cardiomyopathy Raymond Rios appears somewhat volume overloaded today.  He has gained 5 pounds over the last 6 months.  He is only using furosemide as needed.  I have recommended that he take furosemide 20 mg daily for 1 week followed by 20 mg daily as needed for weight gain or swelling.  We will continue carvedilol 3.125 mg twice daily.  I am hesitant to escalate this further or add ACE inhibitor/ARB at this time due to history of soft blood pressures and lightheadedness.  We again discussed potential for  life-threatening arrhythmias in the setting of severely reduced LVEF.  Raymond Rios again declines ICD referral.  Hyperlipidemia LDL is at goal.  Continue atorvastatin 40 mg daily.  Tremor This could be related to underlying neurologic process.  If this persists, Raymond Rios may benefit from neurology consultation.  I will defer this to Dr. Arlana Pouch.  Follow-up: Return to clinic in 2 months.  Yvonne Kendall, MD 05/16/2018 2:36 PM

## 2018-05-16 NOTE — Patient Instructions (Signed)
Medication Instructions:  Your physician has recommended you make the following change in your medication:  1- ONCE your Plavix runs out, please STOP then start taking Aspirin 81 mg by mouth once a day. 2- TAKE Furosemide 20 mg  (1 tablet) by mouth once a day for 1 week, THEN take once a day as needed for swelling or weight gain.   If you need a refill on your cardiac medications before your next appointment, please call your pharmacy.   Lab work: none If you have labs (blood work) drawn today and your tests are completely normal, you will receive your results only by: Marland Kitchen MyChart Message (if you have MyChart) OR . A paper copy in the mail If you have any lab test that is abnormal or we need to change your treatment, we will call you to review the results.  Testing/Procedures: none  Follow-Up: At [facility], you and your health needs are our priority.  As part of our continuing mission to provide you with exceptional heart care, we have created designated Provider Care Teams.  These Care Teams include your primary Cardiologist (physician) and Advanced Practice Providers (APPs -  Physician Assistants and Nurse Practitioners) who all work together to provide you with the care you need, when you need it. You will need a follow up appointment in 2 months.  Please call our office 2 months in advance to schedule this appointment.  You may see Yvonne Kendall, MD or one of the following Advanced Practice Providers on your designated Care Team:   Nicolasa Ducking, NP Eula Listen, PA-C . Marisue Ivan, PA-C  Any Other Special Instructions Will Be Listed Below (If Applicable).  Call us if your shortness of breath worsens.

## 2018-05-16 NOTE — Progress Notes (Deleted)
Follow-up Outpatient Visit Date: 05/16/2018  Primary Care Provider: Jaclyn Shaggy, MD [address]   [place] [address]  Chief Complaint: ***  HPI:  Raymond Rios is a 84 y.o. year-old male with history of coronary artery disease with late-presenting anterior STEMI managed medically in 05/2017, chronic systolic heart failure due to ischemic cardiomyopathy, paroxysmal atrial fibrillation, hypertension, hyperlipidemia, COPD, and myasthenia gravis, who presents for follow-up of coronary artery disease and chronic systolic heart failure.  I last saw Raymond Rios in May, at which time he was doing well.  Later that month, he had a syncopal episode concerning for cardiogenic etiology.  Subsequent event monitor showed 2 brief episodes of asymptomatic nonsustained ventricular tachycardia (5 beats).  He was doing well at that time.  No medication changes were made.  The patient again declined referral for ICD evaluation.  --------------------------------------------------------------------------------------------------  Past Medical History:  Diagnosis Date  . (HFpEF) heart failure with preserved ejection fraction (HCC)   . Allergy   . Anemia   . Arthritis   . CAD (coronary artery disease)    a. late presenting anterior STEMI 05/29/17 Cath: thrombotic occlusion of pLAD, dominant LCx, OM3 50%, lpda 80%, ant & apical AK with EF 35%.    . Depression   . GERD (gastroesophageal reflux disease)   . History of chickenpox   . History of colon polyps   . Hyperlipidemia   . Hypertension   . Ischemic cardiomyopathy    a. late presenting anterior STEMI 05/29/2017, TTE 05/30/17: EF 30-35%, AK of the mid-apicalanteroseptal, anterior, antlat, & apical wall, Gr1DD; c. 08/2017 Echo: EF 30-35%, mid-apicalanteroseptal, ant, apical AK, Gr1 DD.  . Myasthenia gravis (HCC)   . PAF (paroxysmal atrial fibrillation) (HCC)    a, newly diagnosed 06/01/2017; b. CHADS2VASC => 5 (CHF, HTN, age x 2, vascular  disease)-->No OAC in the setting of ongoing ASA/Plavix, mild dementia, lives alone.  On Amio.  Marland Kitchen Urine incontinence    Past Surgical History:  Procedure Laterality Date  . CHOLECYSTECTOMY    . LEFT HEART CATH AND CORONARY ANGIOGRAPHY N/A 05/29/2017   Procedure: LEFT HEART CATH AND CORONARY ANGIOGRAPHY;  Surgeon: Yvonne Kendall, MD;  Location: [facility] INVASIVE CV LAB;  Service: Cardiovascular;  Laterality: N/A;  . TONSILLECTOMY AND ADENOIDECTOMY      No outpatient medications have been marked as taking for the 05/16/18 encounter (Appointment) with Avarose Mervine, Cristal Deer, MD.    Allergies: Other and Sulfonamide derivatives  Social History   Tobacco Use  . Smoking status: Former Games developer  . Smokeless tobacco: Former Neurosurgeon  . Tobacco comment: quit around 1970's  Substance Use Topics  . Alcohol use: No  . Drug use: No    Family History  Problem Relation Age of Onset  . Hyperlipidemia Mother   . Arthritis Father   . Hyperlipidemia Father     Review of Systems: A 12-system review of systems was performed and was negative except as noted in the HPI.  --------------------------------------------------------------------------------------------------  Physical Exam: There were no vitals taken for this visit.  General:  *** HEENT: No conjunctival pallor or scleral icterus. Moist mucous membranes.  OP clear. Neck: Supple without lymphadenopathy, thyromegaly, JVD, or HJR. No carotid bruit. Lungs: Normal work of breathing. Clear to auscultation bilaterally without wheezes or crackles. Heart: Regular rate and rhythm without murmurs, rubs, or gallops. Non-displaced PMI. Abd: Bowel sounds present. Soft, NT/ND without hepatosplenomegaly Ext: No lower extremity edema. Radial, PT, and DP pulses are 2+ bilaterally. Skin: Warm and  dry without rash.  EKG:  ***  Lab Results  Component Value Date   WBC 8.5 06/01/2017   HGB 11.2 (L) 06/01/2017   HCT 33.4 (L) 06/01/2017   MCV 86.1 06/01/2017    PLT 112 (L) 06/01/2017    Lab Results  Component Value Date   NA 140 02/11/2018   K 4.2 02/11/2018   CL 103 02/11/2018   CO2 23 02/11/2018   BUN 21 02/11/2018   CREATININE 0.99 02/11/2018   GLUCOSE 92 02/11/2018   ALT 57 05/29/2017    Lab Results  Component Value Date   CHOL 89 05/30/2017   HDL 46 05/30/2017   LDLCALC 34 05/30/2017   LDLDIRECT 190.3 08/23/2009   TRIG 44 05/30/2017   CHOLHDL 1.9 05/30/2017    --------------------------------------------------------------------------------------------------  ASSESSMENT AND PLAN: Cristal Deer Domonic Kimball, MD 05/16/2018 7:45 AM

## 2018-06-04 ENCOUNTER — Telehealth: Payer: Self-pay

## 2018-06-04 MED ORDER — CARVEDILOL 3.125 MG PO TABS: 3.1250 mg | ORAL_TABLET | Freq: Two times a day (BID) | ORAL | 2 refills | Status: DC

## 2018-06-04 NOTE — Telephone Encounter (Signed)
Medication refill, carvedilol 3.126 mg take 2 times daily Rx to [facility].

## 2018-06-06 ENCOUNTER — Other Ambulatory Visit: Payer: Self-pay | Admitting: Internal Medicine

## 2018-07-15 ENCOUNTER — Telehealth: Payer: Self-pay | Admitting: Internal Medicine

## 2018-07-15 NOTE — Telephone Encounter (Signed)
Noted, Thanks

## 2018-07-15 NOTE — Telephone Encounter (Signed)
Patient's PCP office calling States that patient's last lab has not been reviewed with patient and will not be reviewed til 12/17 Will be faxing over last lab from April

## 2018-07-15 NOTE — Telephone Encounter (Signed)
Made in error

## 2018-07-22 NOTE — Progress Notes (Signed)
Cardiology Office Note Date:  07/23/2018  Patient ID:  Raymond Rios, DOB Jul 18, 1931, MRN [id number] PCP:  Jaclyn Shaggy, MD  Cardiologist:  Dr. Okey Dupre, MD    Chief Complaint: Follow up  History of Present Illness: Raymond Rios is a 84 y.o. male with history of CAD with late presenting anterior STEMI in 05/2017 with finding of an occluded LAD, HFrEF secondary to ICM, PAF, COPD, hypertension, hyperlipidemia, and myasthenia gravis who presents for follow-up of CAD and cardiomyopathy.  He was admitted to the hospital in 05/2017 with a late presenting anterior STEMI.  Cardiac cath showed a thrombotic occlusion of the LAD and otherwise nonobstructive disease.  Medical therapy was recommended given late presentation and absence of chest pain.  Echo showed an EF of 30 to 35%.  During that admission, he was noted to have PAF and was placed on amiodarone with conversion to sinus rhythm.  Oral anticoagulation was not started in the setting of him already been on dual antiplatelet therapy, he lives alone, and has dementia.  Follow-up echo in 08/2017 showed persistent LV dysfunction with an EF of 30 to 35% with the patient having previously declined ICD therapy.  He was seen in the office in 12/2017 following an episode of syncope that occurred on 12/19/2017 that was sudden in nature.  He wore an event monitor for 1 month in 01/2018 which revealed 2 brief episodes of nonsustained VT lasting 5 beats.  Otherwise, there were no sustained arrhythmias or prolonged pauses.  He was asymptomatic during the monitoring period.  He again declined referral to EP for ICD evaluation.  He was last seen in the office on 05/16/2018 and was feeling about the same as compared to his prior visits.  He denied any chest pain, shortness of breath, palpitations, or edema.  He was concerned about chronic fatigue that had been present since the year prior.  He also noted frequent tremors.  He had self discontinued aspirin in the setting of easy  bleeding with minor trauma.  He remained on Plavix.  He was taking Lasix once per week for leg swelling.  He reported his home weights had been stable.  Given he was approximately 1 year out from his late presenting STEMI it was recommended he finish taking his current supply of Plavix and then transition to aspirin 81 mg daily as monotherapy.  He did appear somewhat volume overloaded at his last visit on 10/10 and had gained approximately 5 pounds over the prior 6 months.  It was recommended he take Lasix 20 mg daily for 1 week followed by 20 mg daily as needed for weight gain/swelling.  He was continued on Coreg without the addition of ACE inhibitor/ARB given his history of relative hypotension and lightheadedness.  He again declined EP referral for ICD evaluation.  Labs: 07/2018 - creatinine 1.05, potassium 4.7, liver function normal, LDL 57  He comes in accompanied by his son today and is doing well from a cardiac perspective.  Over the past 12 months since he has been living at [facility] he has been eating more regularly and also lives a more sedentary lifestyle.  Patient son indicates prior to transitioning from home to [facility] the patient's weight had dropped approximately 30 pounds secondary to decreased appetite.  He is now approaching his prior baseline weight.  His weight is up another 6 pounds when compared to last office visit though both patient and son do not feel like this is fluid retention.  They indicate he typically gets lower extremity edema when he is volume overloaded and they report he has really not had any of that.  He has no orthopnea, abdominal distention, PND, or early satiety.  No chest pain, shortness of breath, palpitations, dizziness, presyncope, or syncope.  He is uncertain if he is still taking Plavix in addition to his aspirin or not.  He indicates he last took his as needed dose of Lasix greater than 1 week prior.  When asked why he took it at that time he reports "I  was hoping I could pee at all out and not have to get up throughout the night."  He remains uninterested in EP referral for ICD evaluation.   Past Medical History:  Diagnosis Date  . Allergy   . Anemia   . Arthritis   . CAD (coronary artery disease)    a. late presenting anterior STEMI 05/29/17 Cath: thrombotic occlusion of pLAD, dominant LCx, OM3 50%, lpda 80%, ant & apical AK with EF 35%.    . Chronic systolic heart failure (HCC)   . Depression   . GERD (gastroesophageal reflux disease)   . History of chickenpox   . History of colon polyps   . Hyperlipidemia   . Hypertension   . Ischemic cardiomyopathy    a. late presenting anterior STEMI 05/29/2017, TTE 05/30/17: EF 30-35%, AK of the mid-apicalanteroseptal, anterior, antlat, & apical wall, Gr1DD; c. 08/2017 Echo: EF 30-35%, mid-apicalanteroseptal, ant, apical AK, Gr1 DD.  . Myasthenia gravis (HCC)   . PAF (paroxysmal atrial fibrillation) (HCC)    a, newly diagnosed 06/01/2017; b. CHADS2VASC => 5 (CHF, HTN, age x 2, vascular disease)-->No OAC in the setting of ongoing ASA/Plavix, mild dementia, lives alone.  On Amio.  Marland Kitchen. Urine incontinence     Past Surgical History:  Procedure Laterality Date  . CHOLECYSTECTOMY    . LEFT HEART CATH AND CORONARY ANGIOGRAPHY N/A 05/29/2017   Procedure: LEFT HEART CATH AND CORONARY ANGIOGRAPHY;  Surgeon: Yvonne KendallEnd, Christopher, MD;  Location: [facility] INVASIVE CV LAB;  Service: Cardiovascular;  Laterality: N/A;  . TONSILLECTOMY AND ADENOIDECTOMY      Current Meds  Medication Sig  . aspirin EC 81 MG tablet Take 1 tablet (81 mg total) by mouth daily. Start once you have finished your plavix.  Marland Kitchen. atorvastatin (LIPITOR) 40 MG tablet TAKE 1 TABLET (40 MG TOTAL) BY MOUTH DAILY AT 6 PM.  . carvedilol (COREG) 3.125 MG tablet Take 1 tablet (3.125 mg total) by mouth 2 (two) times daily with a meal.  . clopidogrel (PLAVIX) 75 MG tablet Take 1 tablet (75 mg total) by mouth daily with breakfast.  . furosemide (LASIX) 20  MG tablet Take 20 mg (1 tablet) by mouth once a day for 1 week, then take 1 tablet once a day as needed for swelling or weight gain.  Marland Kitchen. levothyroxine (SYNTHROID, LEVOTHROID) 75 MCG tablet Take 75 mcg by mouth every morning.  Marland Kitchen. PARoxetine (PAXIL) 10 MG tablet Take 10 mg by mouth daily.  . tamsulosin (FLOMAX) 0.4 MG CAPS capsule Take 0.4 mg by mouth daily.    Allergies:   Other and Sulfonamide derivatives   Social History:  The patient  reports that he has quit smoking. He has quit using smokeless tobacco. He reports that he does not drink alcohol or use drugs.   Family History:  The patient's family history includes Arthritis in his father; Hyperlipidemia in his father and mother.  ROS:   Review of Systems  Constitutional:  Positive for malaise/fatigue. Negative for chills, diaphoresis, fever and weight loss.  HENT: Negative for congestion.   Eyes: Negative for discharge and redness.  Respiratory: Negative for cough, hemoptysis, sputum production, shortness of breath and wheezing.   Cardiovascular: Positive for leg swelling. Negative for chest pain, palpitations, orthopnea, claudication and PND.  Gastrointestinal: Negative for abdominal pain, blood in stool, heartburn, melena, nausea and vomiting.  Genitourinary: Positive for frequency. Negative for hematuria.  Musculoskeletal: Negative for falls and myalgias.  Skin: Negative for rash.  Neurological: Positive for weakness. Negative for dizziness, tingling, tremors, sensory change, speech change, focal weakness and loss of consciousness.  Endo/Heme/Allergies: Does not bruise/bleed easily.  Psychiatric/Behavioral: Negative for substance abuse. The patient is not nervous/anxious.   All other systems reviewed and are negative.    PHYSICAL EXAM:  VS:  BP 110/60 (BP Location: Left Arm, Patient Position: Sitting, Cuff Size: Normal)   Pulse 77   Ht 5\' 8"  (1.727 m)   Wt 176 lb 12 oz (80.2 kg)   SpO2 97%   BMI 26.87 kg/m  BMI: Body mass index  is 26.87 kg/m.  Physical Exam  Constitutional: He is oriented to person, place, and time. He appears well-developed and well-nourished.  HENT:  Head: Normocephalic and atraumatic.  Eyes: Right eye exhibits no discharge. Left eye exhibits no discharge.  Neck: Normal range of motion. No JVD present.  Cardiovascular: Normal rate, regular rhythm, S1 normal, S2 normal and normal heart sounds. Exam reveals no distant heart sounds, no friction rub, no midsystolic click and no opening snap.  No murmur heard. Pulses:      Posterior tibial pulses are 2+ on the right side and 2+ on the left side.  Pulmonary/Chest: Effort normal. No respiratory distress. He has no decreased breath sounds. He has no wheezes. He has rales. He exhibits no tenderness.  Faint crackles along the bilateral bases.  Abdominal: Soft. He exhibits no distension. There is no abdominal tenderness.  Musculoskeletal:        General: Edema present.     Comments: Trace bilateral ankle edema.  Neurological: He is alert and oriented to person, place, and time.  Skin: Skin is warm and dry. No cyanosis. Nails show no clubbing.  Psychiatric: He has a normal mood and affect. His speech is normal and behavior is normal. Judgment and thought content normal.     EKG:  Was ordered and interpreted by me today. Shows NSR, 77 bpm, rare PVC, lateral TWI  Recent Labs: 12/25/2017: Magnesium 1.9 02/11/2018: BUN 21; Creatinine, Ser 0.99; Potassium 4.2; Sodium 140  No results found for requested labs within last 8760 hours.   CrCl cannot be calculated (Patient's most recent lab result is older than the maximum 21 days allowed.).   Wt Readings from Last 3 Encounters:  07/23/18 176 lb 12 oz (80.2 kg)  05/16/18 170 lb (77.1 kg)  02/11/18 167 lb 8 oz (76 kg)     Other studies reviewed: Additional studies/records reviewed today include: summarized above  ASSESSMENT AND PLAN:  1. CAD involving the native coronary arteries without angina: He is  doing well without any symptoms concerning for angina.  He is greater than 1 year out from his late presenting STEMI that was managed medically.  He is uncertain if he actually discontinued his Plavix or not.  I have again recommended that he can discontinue his Plavix as was previously mentioned by interventional cardiology.  He will remain on aspirin 81 mg daily indefinitely.  He will otherwise  remain on Coreg and Lipitor.  Continue aggressive risk factor modification and secondary prevention.  2. HFrEF secondary to ICM: His weight is up another 6 pounds when compared to his last office visit from 05/2018.  Patient and his son attribute this to increased p.o. appetite and sedentary lifestyle.  However, he does have trace bilateral ankle edema and faint crackles along the bilateral bases.  This is consistent with his prior exam from 05/2018.  It does appear the patient continues to eat out at restaurants as the patient son told me they ate at [facility] prior to coming to his appointment today.  He also reports putting salt on his daily salads.  He will take Lasix 20 mg daily for the next 3 days followed by as needed for shortness of breath, lower extremity swelling, or weight gain greater than 3 pounds overnight or 5 pounds in 1 week.  CHF education was discussed.  3. PAF: Noted while admitted in 05/2017 in the setting of late presenting MI.  Has not been maintained on oral anticoagulation given the prior need for dual antiplatelet therapy, brief episode in the setting of acute illness, and lack of recurrence.  Continue to monitor.  4. Hyperlipidemia: Most recent LDL of 57 with normal liver function from earlier in 07/2018.  Remains on Lipitor 40 mg daily.  5. Hypertension: Has previously had relative hypotension which has precluded de-escalation of evidence-based heart failure therapy as above.  Continue carvedilol 3.125 mg twice daily.  Disposition: F/u with Dr. Okey Dupre or an APP in 4 months (patient's  son requested push off of follow-up appointment)  Current medicines are reviewed at length with the patient today.  The patient did not have any concerns regarding medicines.  Elinor Dodge PA-C 07/23/2018 2:41 PM     [facility] [place] [address] [place], [place] [address] [phone]

## 2018-07-23 ENCOUNTER — Encounter: Payer: Self-pay | Admitting: Physician Assistant

## 2018-07-23 ENCOUNTER — Ambulatory Visit (INDEPENDENT_AMBULATORY_CARE_PROVIDER_SITE_OTHER): Payer: Medicare HMO | Admitting: Physician Assistant

## 2018-07-23 VITALS — BP 110/60 | HR 77 | Ht 68.0 in | Wt 176.8 lb

## 2018-07-23 DIAGNOSIS — I480 Paroxysmal atrial fibrillation: Secondary | ICD-10-CM

## 2018-07-23 DIAGNOSIS — E785 Hyperlipidemia, unspecified: Secondary | ICD-10-CM

## 2018-07-23 DIAGNOSIS — I2510 Atherosclerotic heart disease of native coronary artery without angina pectoris: Secondary | ICD-10-CM | POA: Diagnosis not present

## 2018-07-23 DIAGNOSIS — I255 Ischemic cardiomyopathy: Secondary | ICD-10-CM

## 2018-07-23 DIAGNOSIS — I10 Essential (primary) hypertension: Secondary | ICD-10-CM

## 2018-07-23 DIAGNOSIS — I5022 Chronic systolic (congestive) heart failure: Secondary | ICD-10-CM | POA: Diagnosis not present

## 2018-07-23 MED ORDER — FUROSEMIDE 20 MG PO TABS: ORAL_TABLET | ORAL | 0 refills | Status: DC

## 2018-07-23 NOTE — Patient Instructions (Signed)
Medication Instructions:  Your physician has recommended you make the following change in your medication:  1- Stop taking Plavix (Clopidogrel) today. 2- Take Lasix 1 tablet (20 mg) once daily for 3 days, then resume taking only as needed.    If you need a refill on your cardiac medications before your next appointment, please call your pharmacy.   Lab work: None ordered   If you have labs (blood work) drawn today and your tests are completely normal, you will receive your results only by: Marland Kitchen. MyChart Message (if you have MyChart) OR . A paper copy in the mail If you have any lab test that is abnormal or we need to change your treatment, we will call you to review the results.  Testing/Procedures: None ordered   Follow-Up: At [facility], you and your health needs are our priority.  As part of our continuing mission to provide you with exceptional heart care, we have created designated Provider Care Teams.  These Care Teams include your primary Cardiologist (physician) and Advanced Practice Providers (APPs -  Physician Assistants and Nurse Practitioners) who all work together to provide you with the care you need, when you need it. You will need a follow up appointment in 4 months.  Please call our office 2 months in advance to schedule this appointment.  You may see Yvonne Kendallhristopher End, MD or one of the following Advanced Practice Providers on your designated Care Team:   Nicolasa Duckinghristopher Berge, NP Eula Listenyan Dunn, PA-C . Marisue IvanJacquelyn Visser, PA-C

## 2018-10-07 ENCOUNTER — Telehealth: Payer: Self-pay | Admitting: Internal Medicine

## 2018-10-07 ENCOUNTER — Other Ambulatory Visit: Payer: Self-pay | Admitting: *Deleted

## 2018-10-07 MED ORDER — CARVEDILOL 3.125 MG PO TABS: 3.1250 mg | ORAL_TABLET | Freq: Two times a day (BID) | ORAL | 0 refills | Status: DC

## 2018-10-07 NOTE — Telephone Encounter (Signed)
Requested Prescriptions   Signed Prescriptions Disp Refills  . carvedilol (COREG) 3.125 MG tablet 180 tablet 0    Sig: Take 1 tablet (3.125 mg total) by mouth 2 (two) times daily with a meal.    Authorizing Provider: END, CHRISTOPHER    Ordering User: Kendrick Fries

## 2018-10-07 NOTE — Telephone Encounter (Signed)
Requested Prescriptions   Signed Prescriptions Disp Refills  . carvedilol (COREG) 3.125 MG tablet 180 tablet 0    Sig: Take 1 tablet (3.125 mg total) by mouth 2 (two) times daily with a meal.    Authorizing Provider: END, CHRISTOPHER    Ordering User: Demetres Prochnow C    

## 2018-10-07 NOTE — Telephone Encounter (Signed)
°*  STAT* If patient is at the pharmacy, call can be transferred to refill team.   1. Which medications need to be refilled? (please list name of each medication and dose if known) carvedilol (COREG) 3.125 MG tablet [[id number]] po BID  2. Which pharmacy/location (including street and city if local pharmacy) is medication to be sent to? [facility]  3. Do they need a 30 day or 90 day supply? 90

## 2018-10-09 ENCOUNTER — Other Ambulatory Visit: Payer: Self-pay | Admitting: Internal Medicine

## 2018-10-09 MED ORDER — ATORVASTATIN CALCIUM 40 MG PO TABS: 40.0000 mg | ORAL_TABLET | Freq: Every day | ORAL | 1 refills | Status: DC

## 2018-10-09 NOTE — Telephone Encounter (Signed)
°*  STAT* If patient is at the pharmacy, call can be transferred to refill team.   1. Which medications need to be refilled? (please list name of each medication and dose if known) atorvastatin (LIPITOR) 40 MG - 1 tablet daily   2. Which pharmacy/location (including street and city if local pharmacy) is medication to be sent to? [facility]   3. Do they need a 30 day or 90 day supply? 90 day

## 2018-11-27 ENCOUNTER — Ambulatory Visit: Payer: Medicare HMO | Admitting: Internal Medicine

## 2018-12-07 ENCOUNTER — Other Ambulatory Visit: Payer: Self-pay | Admitting: Internal Medicine

## 2019-01-17 ENCOUNTER — Encounter: Payer: Self-pay | Admitting: Cardiovascular Disease

## 2019-01-22 ENCOUNTER — Ambulatory Visit: Payer: Medicare HMO | Admitting: Internal Medicine

## 2019-01-23 ENCOUNTER — Telehealth: Payer: Self-pay | Admitting: *Deleted

## 2019-01-23 NOTE — Telephone Encounter (Signed)
-----   Message from Nelva Bush, MD sent at 01/22/2019  9:47 AM EDT ----- Outside labs reviewed.  LDL at goal.  Minimal hyperkalemia noted; our medication list does not include and medicines that would predispose to hyperkalemia.  Please confirm that he is not taking a potassium supplement, ACEI/ARB, or spironolactone.  I do not recommend any medication changes at this time.  He should f/u with me or an APP at his convenience (virtual or in person is fine).

## 2019-01-23 NOTE — Telephone Encounter (Signed)
No answer. Left message to call back.   

## 2019-01-24 NOTE — Telephone Encounter (Signed)
Patient calling to discuss recent testing results  ° °Please call  ° °

## 2019-01-24 NOTE — Telephone Encounter (Signed)
Patient's son, ok per DPR, notified of the results. He is sure that patient's list in Epic is correct. I read him off the medications though he is not currently with the patient or list. Schedule patient to come in for appointment on 01/31/19 with Sharolyn Douglas, NP.

## 2019-01-30 ENCOUNTER — Telehealth: Payer: Self-pay | Admitting: Internal Medicine

## 2019-01-30 NOTE — Telephone Encounter (Signed)

## 2019-01-31 ENCOUNTER — Other Ambulatory Visit: Payer: Self-pay

## 2019-01-31 ENCOUNTER — Ambulatory Visit (INDEPENDENT_AMBULATORY_CARE_PROVIDER_SITE_OTHER): Payer: Medicare HMO | Admitting: Nurse Practitioner

## 2019-01-31 ENCOUNTER — Encounter: Payer: Self-pay | Admitting: Nurse Practitioner

## 2019-01-31 VITALS — BP 114/64 | HR 72 | Temp 98.1°F | Ht 69.0 in | Wt 179.8 lb

## 2019-01-31 DIAGNOSIS — I5022 Chronic systolic (congestive) heart failure: Secondary | ICD-10-CM | POA: Diagnosis not present

## 2019-01-31 DIAGNOSIS — I2510 Atherosclerotic heart disease of native coronary artery without angina pectoris: Secondary | ICD-10-CM | POA: Diagnosis not present

## 2019-01-31 DIAGNOSIS — I480 Paroxysmal atrial fibrillation: Secondary | ICD-10-CM | POA: Diagnosis not present

## 2019-01-31 DIAGNOSIS — R002 Palpitations: Secondary | ICD-10-CM | POA: Diagnosis not present

## 2019-01-31 DIAGNOSIS — I255 Ischemic cardiomyopathy: Secondary | ICD-10-CM

## 2019-01-31 DIAGNOSIS — E785 Hyperlipidemia, unspecified: Secondary | ICD-10-CM

## 2019-01-31 MED ORDER — CARVEDILOL 6.25 MG PO TABS: 6.2500 mg | ORAL_TABLET | Freq: Two times a day (BID) | ORAL | 3 refills | Status: DC

## 2019-01-31 NOTE — Patient Instructions (Signed)
Medication Instructions:  Your physician has recommended you make the following change in your medication:  1- INCREASE Coreg to Take 1 tablet (6.25 mg total) by mouth 2 (two) times daily with a meal. If you need a refill on your cardiac medications before your next appointment, please call your pharmacy.   Lab work: None ordered  If you have labs (blood work) drawn today and your tests are completely normal, you will receive your results only by: Marland Kitchen MyChart Message (if you have MyChart) OR . A paper copy in the mail If you have any lab test that is abnormal or we need to change your treatment, we will call you to review the results.  Testing/Procedures: None ordered   Follow-Up: At [facility], you and your health needs are our priority.  As part of our continuing mission to provide you with exceptional heart care, we have created designated Provider Care Teams.  These Care Teams include your primary Cardiologist (physician) and Advanced Practice Providers (APPs -  Physician Assistants and Nurse Practitioners) who all work together to provide you with the care you need, when you need it. You will need a follow up appointment in 3 months.  You may see Nelva Bush, MD or Murray Hodgkins, NP.

## 2019-01-31 NOTE — Progress Notes (Signed)
Office Visit    Patient Name: Raymond Rios Date of Encounter: 01/31/2019  Primary Care Provider:  Albina Billet, MD Primary Cardiologist:  Nelva Bush, MD  Chief Complaint    85 y/o ? with a h/o CAD s/p late presenting anterior STEMI in October 2018 with finding of an occluded LAD, ischemic cardiomyopathy/HFrEF, paroxysmal atrial fibrillation, hypertension, hyperlipidemia, COPD, and myasthenia gravis who presents for follow-up of CAD.  Past Medical History    Past Medical History:  Diagnosis Date  . Allergy   . Anemia   . Arthritis   . CAD (coronary artery disease)    a. late presenting anterior STEMI 05/29/17 Cath: thrombotic occlusion of pLAD, dominant LCx, OM3 50%, lpda 80%, ant & apical AK with EF 35%.    . Chronic systolic heart failure ([place])   . Depression   . GERD (gastroesophageal reflux disease)   . History of chickenpox   . History of colon polyps   . Hyperlipidemia   . Hypertension   . Ischemic cardiomyopathy    a. late presenting anterior STEMI 05/29/2017, TTE 05/30/17: EF 30-35%, AK of the mid-apicalanteroseptal, anterior, antlat, & apical wall, Gr1DD; c. 08/2017 Echo: EF 30-35%, mid-apicalanteroseptal, ant, apical AK, Gr1 DD.  . Myasthenia gravis ([place])   . PAF (paroxysmal atrial fibrillation) ([place])    a, newly diagnosed 06/01/2017; b. CHADS2VASC => 5 (CHF, HTN, age x 2, vascular disease)-->No OAC in the setting of ongoing ASA/Plavix, mild dementia, lives alone.  On Amio.  Marland Kitchen Urine incontinence    Past Surgical History:  Procedure Laterality Date  . CARDIAC CATHETERIZATION    . CHOLECYSTECTOMY    . LEFT HEART CATH AND CORONARY ANGIOGRAPHY N/A 05/29/2017   Procedure: LEFT HEART CATH AND CORONARY ANGIOGRAPHY;  Surgeon: Nelva Bush, MD;  Location: [facility];  Service: Cardiovascular;  Laterality: N/A;  . TONSILLECTOMY AND ADENOIDECTOMY      Allergies  Allergies  Allergen Reactions  . Other Hives  . Sulfonamide Derivatives     REACTION:  unspecified    History of Present Illness    85 year old male with above complex past medical history including hypertension, hyperlipidemia, COPD, and myasthenia gravis.  October 2018, he was noted late presenting anterior STEMI.  Echo showed an EF of 3035%.  Catheterization showed a thrombotic occlusion of the LAD and otherwise nonobstructive disease.  Medical therapy was recommended given late presentation absence of chest pain.  During hospitalization, he was noted to have paroxysmal atrial fibrillation was placed on amiodarone therapy with conversion to sinus rhythm.  Oral anticoagulation was not initiated as he was already on dual antiplatelet therapy, has dementia, and lives alone.  Follow-up echo in January 2019 showed persistent LV dysfunction with an EF of 30-35%.  He has previously declined ICD therapy.  He was last seen in clinic in December 2019, at which time he was doing relatively well though had mild volume overload and he was advised to take Lasix 20 mg daily x3 days and then use it on a as needed basis.  Since his last visit, he has done reasonably well.  He lives at [facility] assisted living says it has been somewhat depressing during the pandemic and lockdown as he is not allowed visitors and he is tired of staring at his 4 walls.  He occasionally notes mild ankle edema and has been using Lasix 20 mg about once a week with good response.  He has noted intermittent tachypalpitations and fluttering in his chest,  frequently awaken him from sleep, about once a week, and lasting for several hours.  No associated symptoms.  He denies chest pain, dyspnea, pnd, orthopnea, n, v, dizziness, syncope, edema, weight gain, or early satiety.   Home Medications    Prior to Admission medications   Medication Sig Start Date End Date Taking? Authorizing Provider  amoxicillin (AMOXIL) 500 MG capsule Take 500 mg by mouth 3 (three) times daily. 01/27/19  Yes [provider]  aspirin EC 81 MG  tablet Take 1 tablet (81 mg total) by mouth daily. Start once you have finished your plavix. 05/16/18  Yes End, Cristal Deerhristopher, MD  atorvastatin (LIPITOR) 40 MG tablet Take 1 tablet (40 mg total) by mouth daily at 6 PM. 10/09/18  Yes Dunn, Raymon Muttonyan M, PA-C  carvedilol (COREG) 3.125 MG tablet TAKE 1 TABLET TWICE DAILY WITH A MEAL 12/09/18  Yes End, Cristal Deerhristopher, MD  furosemide (LASIX) 20 MG tablet Take 20 mg (1 tablet) by mouth once a day for 1 week, then take 1 tablet once a day as needed for swelling or weight gain. 05/16/18  Yes End, Cristal Deerhristopher, MD  levothyroxine (SYNTHROID, LEVOTHROID) 75 MCG tablet Take 75 mcg by mouth every morning. 03/23/17  Yes [provider]  PARoxetine (PAXIL) 10 MG tablet Take 10 mg by mouth daily.   Yes [provider]  tamsulosin (FLOMAX) 0.4 MG CAPS capsule Take 0.4 mg by mouth daily.   Yes [provider]  zoster vaccine live, PF, (ZOSTAVAX) [id number] UNT/0.65ML injection Inject 19,400 Units into the skin once. 02/16/14  Yes Rey, Richarda Overlieaquel M, NP    Review of Systems    Palpitations as outlined above.  Occasional lower extremity swelling which is easily managed with Lasix.  Has chest pain, dyspnea, PND, orthopnea, dizziness, syncope, edema, or early satiety.  All other systems reviewed and are otherwise negative except as noted above.  Physical Exam    VS:  BP 114/64 (BP Location: Right Arm, Patient Position: Sitting, Cuff Size: Normal)   Pulse 72   Temp 98.1 F (36.7 C)   Ht 5\' 9"  (1.753 m)   Wt 179 lb 12 oz (81.5 kg)   SpO2 94%   BMI 26.54 kg/m  , BMI Body mass index is 26.54 kg/m. GEN: Well nourished, well developed, in no acute distress. HEENT: normal. Neck: Supple, no JVD, carotid bruits, or masses. Cardiac: RRR, no murmurs, rubs, or gallops. No clubbing, cyanosis, edema.  Radials/PT 1+ and equal bilaterally.  Respiratory:  Respirations regular and unlabored, clear to auscultation bilaterally. GI: Soft, nontender, nondistended, BS + x 4. MS:  no deformity or atrophy. Skin: warm and dry, no rash. Neuro:  Strength and sensation are intact. Psych: Normal affect.  Accessory Clinical Findings    ECG personally reviewed by me today -regular sinus rhythm, 72, septal and lateral infarcts  Assessment & Plan    1.  Coronary artery disease: Patient has been doing well since his late presenting anterior STEMI in October 2018.  He has a known occlusion of the LAD.  He does not experience chest pain and remains on aspirin, statin and beta-blocker therapy.  2.  Ischemic cardiomyopathy/HFrEF: EF 30 to 35% by echo in January 2019.  He remains on beta-blocker therapy which I am going to titrate today in the setting of recurrent palpitations and my concern for paroxysmal A. fib.  Historically, pressures of been too soft to add any additional heart failure medications.  Previously noted, he is not interested in EP referral/AICD and  with advanced age, a more conservative approach is appropriate.  He remains on as needed Lasix and overall does well and is euvolemic today.  3.  Paroxysmal atrial fibrillation/palpitations: PAF was noted during hospitalization at the time of late presenting anterior STEMI in October 2018.  Monitoring in 2019 did not show any A. fib.  He now reports intermittent episodes of rotations and fluttering in his chest that can last hours at a time and frequently occur at night.  We discussed possibly replacing a monitor however, he does not wish to proceed with that.  I offered to draw labs today however he deferred.  I will increase his carvedilol to 6.25 mg twice daily.  Of note, we also discussed the role of oral anticoagulation in the setting of paroxysmal A. fib with a CHA2DS2VASc of 5.  He is not interested in oral anticoagulation.  4.  Hyperlipidemia: He remains on statin therapy and he says that his primary care provider has checked his lipids.  5.  Disposition: F/u with Dr. Okey DupreEnd in 3-4 months or sooner if necessary.   Nicolasa Duckinghristopher Lenor Provencher, NP 01/31/2019, 2:41 PM

## 2019-03-03 ENCOUNTER — Other Ambulatory Visit: Payer: Self-pay | Admitting: Internal Medicine

## 2019-03-03 MED ORDER — CARVEDILOL 6.25 MG PO TABS: 6.2500 mg | ORAL_TABLET | Freq: Two times a day (BID) | ORAL | 3 refills | Status: DC

## 2019-03-03 NOTE — Telephone Encounter (Signed)
°*  STAT* If patient is at the pharmacy, call can be transferred to refill team.   1. Which medications need to be refilled? (please list name of each medication and dose if known) Carvedilol 6.25 bid  2. Which pharmacy/location (including street and city if local pharmacy) is medication to be sent to? [facility] [facility]   3. Do they need a 30 day or 90 day supply? [place]

## 2019-05-05 ENCOUNTER — Encounter: Payer: Self-pay | Admitting: Internal Medicine

## 2019-05-05 ENCOUNTER — Ambulatory Visit (INDEPENDENT_AMBULATORY_CARE_PROVIDER_SITE_OTHER): Payer: Medicare HMO | Admitting: Internal Medicine

## 2019-05-05 ENCOUNTER — Other Ambulatory Visit: Payer: Self-pay

## 2019-05-05 DIAGNOSIS — I5022 Chronic systolic (congestive) heart failure: Secondary | ICD-10-CM

## 2019-05-05 DIAGNOSIS — I2510 Atherosclerotic heart disease of native coronary artery without angina pectoris: Secondary | ICD-10-CM

## 2019-05-05 DIAGNOSIS — E785 Hyperlipidemia, unspecified: Secondary | ICD-10-CM | POA: Diagnosis not present

## 2019-05-05 DIAGNOSIS — I255 Ischemic cardiomyopathy: Secondary | ICD-10-CM | POA: Diagnosis not present

## 2019-05-05 MED ORDER — CARVEDILOL 6.25 MG PO TABS: 6.2500 mg | ORAL_TABLET | Freq: Two times a day (BID) | ORAL | 3 refills | Status: DC

## 2019-05-05 MED ORDER — ATORVASTATIN CALCIUM 40 MG PO TABS: 40.0000 mg | ORAL_TABLET | Freq: Every day | ORAL | 3 refills | Status: DC

## 2019-05-05 MED ORDER — FUROSEMIDE 20 MG PO TABS: ORAL_TABLET | ORAL | 3 refills | Status: DC

## 2019-05-05 NOTE — Patient Instructions (Signed)
Medication Instructions:  Your physician recommends that you continue on your current medications as directed. Please refer to the Current Medication list given to you today.  If you need a refill on your cardiac medications before your next appointment, please call your pharmacy.   Lab work: none If you have labs (blood work) drawn today and your tests are completely normal, you will receive your results only by: . MyChart Message (if you have MyChart) OR . A paper copy in the mail If you have any lab test that is abnormal or we need to change your treatment, we will call you to review the results.  Testing/Procedures: none  Follow-Up: At [facility], you and your health needs are our priority.  As part of our continuing mission to provide you with exceptional heart care, we have created designated Provider Care Teams.  These Care Teams include your primary Cardiologist (physician) and Advanced Practice Providers (APPs -  Physician Assistants and Nurse Practitioners) who all work together to provide you with the care you need, when you need it. You will need a follow up appointment in 6 months.  Please call our office 2 months in advance to schedule this appointment.  You may see Christopher End, MD or one of the following Advanced Practice Providers on your designated Care Team:   Christopher Berge, NP Ryan Dunn, PA-C . Jacquelyn Visser, PA-C     

## 2019-05-05 NOTE — Progress Notes (Signed)
Follow-up Outpatient Visit Date: 05/05/2019  Primary Care Provider: Jaclyn Rios, Raymond C, MD [address]   [place] [id number]  Chief Complaint: Palpitations  HPI:  Raymond Rios is a 85 y.o. year-old male with history of coronary artery disease with late-presenting anterior STEMI managed medically in 05/2017, chronic systolic heart failure due to ischemic cardiomyopathy, paroxysmal atrial fibrillation, hypertension, hyperlipidemia, COPD, and myasthenia gravis, who presents for follow-up of coronary artery disease, cardiomyopathy, and paroxysmal atrial fibrillation.  He was last seen in our office in June by [name], PA, at which time he was doing well other than intermittent leg swelling for which he had been using PRN furosemide about once a week.  He also experienced occasional palpitations.  Ambulatory cardiac monitoring was discussed, but the patient declined.  He also did not wish to begin anticoagulation with a history of PAF and more frequent palpitations.  Carvedilol was increased to 6.25 mg twice daily.  Today, Raymond Rios feels relatively well.  He reports feeling as though his heart sometimes goes out of rhythm when he is trying to go to sleep at night.  It happens once or twice a week and can sometimes keep him up for a few hours.  There are no associated symptoms.  He specifically denies chest pain, shortness of breath, and lightheadedness.  He has mild intermittent leg edema that is well controlled with furosemide.  He has not had any orthopnea or PND.  He sometimes will awaken with a choking sensation almost as if he is having reflux up to his throat.  --------------------------------------------------------------------------------------------------  Past Medical History:  Diagnosis Date  . Allergy   . Anemia   . Arthritis   . CAD (coronary artery disease)    a. late presenting anterior STEMI 05/29/17 Cath: thrombotic occlusion of pLAD, dominant LCx, OM3 50%, lpda 80%, ant &  apical AK with EF 35%.    . Chronic systolic heart failure ([facility])   . Depression   . GERD (gastroesophageal reflux disease)   . History of chickenpox   . History of colon polyps   . Hyperlipidemia   . Hypertension   . Ischemic cardiomyopathy    a. late presenting anterior STEMI 05/29/2017, TTE 05/30/17: EF 30-35%, AK of the mid-apicalanteroseptal, anterior, antlat, & apical wall, Gr1DD; Rios. 08/2017 Echo: EF 30-35%, mid-apicalanteroseptal, ant, apical AK, Gr1 DD.  . Myasthenia gravis ([facility])   . PAF (paroxysmal atrial fibrillation) ([facility])    a, newly diagnosed 06/01/2017; b. CHADS2VASC => 5 (CHF, HTN, age x 2, vascular disease)-->No OAC in the setting of ongoing ASA/Plavix, mild dementia, lives alone.  On Amio.  Marland Kitchen. Urine incontinence    Past Surgical History:  Procedure Laterality Date  . CARDIAC CATHETERIZATION    . CHOLECYSTECTOMY    . LEFT HEART CATH AND CORONARY ANGIOGRAPHY N/A 05/29/2017   Procedure: LEFT HEART CATH AND CORONARY ANGIOGRAPHY;  Surgeon: Yvonne KendallEnd, Salik Grewell, MD;  Location: [facility];  Service: Cardiovascular;  Laterality: N/A;  . TONSILLECTOMY AND ADENOIDECTOMY      Current Meds  Medication Sig  . aspirin EC 81 MG tablet Take 1 tablet (81 mg total) by mouth daily. Start once you have finished your plavix.  Marland Kitchen. atorvastatin (LIPITOR) 40 MG tablet Take 1 tablet (40 mg total) by mouth daily at 6 PM.  . carvedilol (COREG) 6.25 MG tablet Take 1 tablet (6.25 mg total) by mouth 2 (two) times daily with a meal.  . furosemide (LASIX) 20 MG tablet Take 20  mg (1 tablet) by mouth once a day for 1 week, then take 1 tablet once a day as needed for swelling or weight gain.  Marland Kitchen levothyroxine (SYNTHROID, LEVOTHROID) 75 MCG tablet Take 75 mcg by mouth every morning.  Marland Kitchen PARoxetine (PAXIL) 10 MG tablet Take 10 mg by mouth daily.  . tamsulosin (FLOMAX) 0.4 MG CAPS capsule Take 0.4 mg by mouth daily.  Marland Kitchen zoster vaccine live, PF, (ZOSTAVAX) 16073 UNT/0.65ML injection Inject 19,400 Units into  the skin once.    Allergies: Other and Sulfonamide derivatives  Social History   Tobacco Use  . Smoking status: Former Games developer  . Smokeless tobacco: Former Neurosurgeon  . Tobacco comment: quit around 1970's  Substance Use Topics  . Alcohol use: No  . Drug use: No    Family History  Problem Relation Age of Onset  . Hyperlipidemia Mother   . Arthritis Father   . Hyperlipidemia Father     Review of Systems: A 12-system review of systems was performed and was negative except as noted in the HPI.  --------------------------------------------------------------------------------------------------  Physical Exam: BP 104/68 (BP Location: Left Arm, Patient Position: Sitting, Cuff Size: Normal)   Pulse 60   Ht 5\' 10"  (1.778 m)   Wt 178 lb 8 oz (81 kg)   BMI 25.61 kg/m   General: NAD.  Accompanied by his son. HEENT: No conjunctival pallor or scleral icterus.  Facemask in place. Neck: Supple without lymphadenopathy, thyromegaly, JVD, or HJR. Lungs: Normal work of breathing. Clear to auscultation bilaterally without wheezes or crackles. Heart: Regular rate and rhythm without murmurs, rubs, or gallops. Non-displaced PMI. Abd: Bowel sounds present. Soft, NT/ND without hepatosplenomegaly Ext: Trace pretibial edema bilaterally. Skin: Warm and dry without rash.  EKG: Normal sinus rhythm with septal infarct and lateral T wave inversions.  Lateral precordial T wave changes are more pronounced than on the prior study from 01/31/2019.  Otherwise, there has been no significant interval change.  Lab Results  Component Value Date   WBC 8.5 06/01/2017   HGB 11.2 (L) 06/01/2017   HCT 33.4 (L) 06/01/2017   MCV 86.1 06/01/2017   PLT 112 (L) 06/01/2017    Lab Results  Component Value Date   NA 140 02/11/2018   K 4.2 02/11/2018   CL 103 02/11/2018   CO2 23 02/11/2018   BUN 21 02/11/2018   CREATININE 0.99 02/11/2018   GLUCOSE 92 02/11/2018   ALT 57 05/29/2017    Lab Results  Component  Value Date   CHOL 89 05/30/2017   HDL 46 05/30/2017   LDLCALC 34 05/30/2017   LDLDIRECT 190.3 08/23/2009   TRIG 44 05/30/2017   CHOLHDL 1.9 05/30/2017    --------------------------------------------------------------------------------------------------  ASSESSMENT AND PLAN: Coronary artery disease: No symptoms suggest worsening coronary insufficiency.  Continue current medications for secondary prevention.  Chronic systolic heart failure due to ischemic cardiomyopathy: Mr. 06/01/2017 appears euvolemic with stable NYHA class II heart failure symptoms.  We will continue current doses of carvedilol and furosemide.  Continued soft blood pressure precludes addition of ACE inhibitor/ARB or further escalation of carvedilol.  Palpitations and paroxysmal atrial fibrillation: Episodes are happening about once or twice a week, mostly at night.  There are no associated symptoms.  We again discussed the utility of repeating a an ambulatory cardiac monitor, but [name] would like to defer this.  He also wishes to defer therapeutic anticoagulation.  We will continue current doses of carvedilol and aspirin.  Hyperlipidemia: LDL well controlled on last check (LDL 45  on outside labs in 01/2019).  Continue atorvastatin 40 mg daily.  Follow-up: Return to clinic in 6 months.  Nelva Bush, MD 05/05/2019 11:30 AM

## 2019-05-06 ENCOUNTER — Encounter: Payer: Self-pay | Admitting: Internal Medicine

## 2019-05-07 ENCOUNTER — Other Ambulatory Visit: Payer: Self-pay | Admitting: Physician Assistant

## 2019-05-28 ENCOUNTER — Encounter: Payer: Self-pay | Admitting: Gastroenterology

## 2019-11-03 IMAGING — DX DG SHOULDER 2+V*R*
4 series · 4 of 4 positions shown · non-contrast
Comparison: None

CLINICAL DATA: Status post fall.

EXAM:
RIGHT SHOULDER - 2+ VIEW

[shoulder ap]
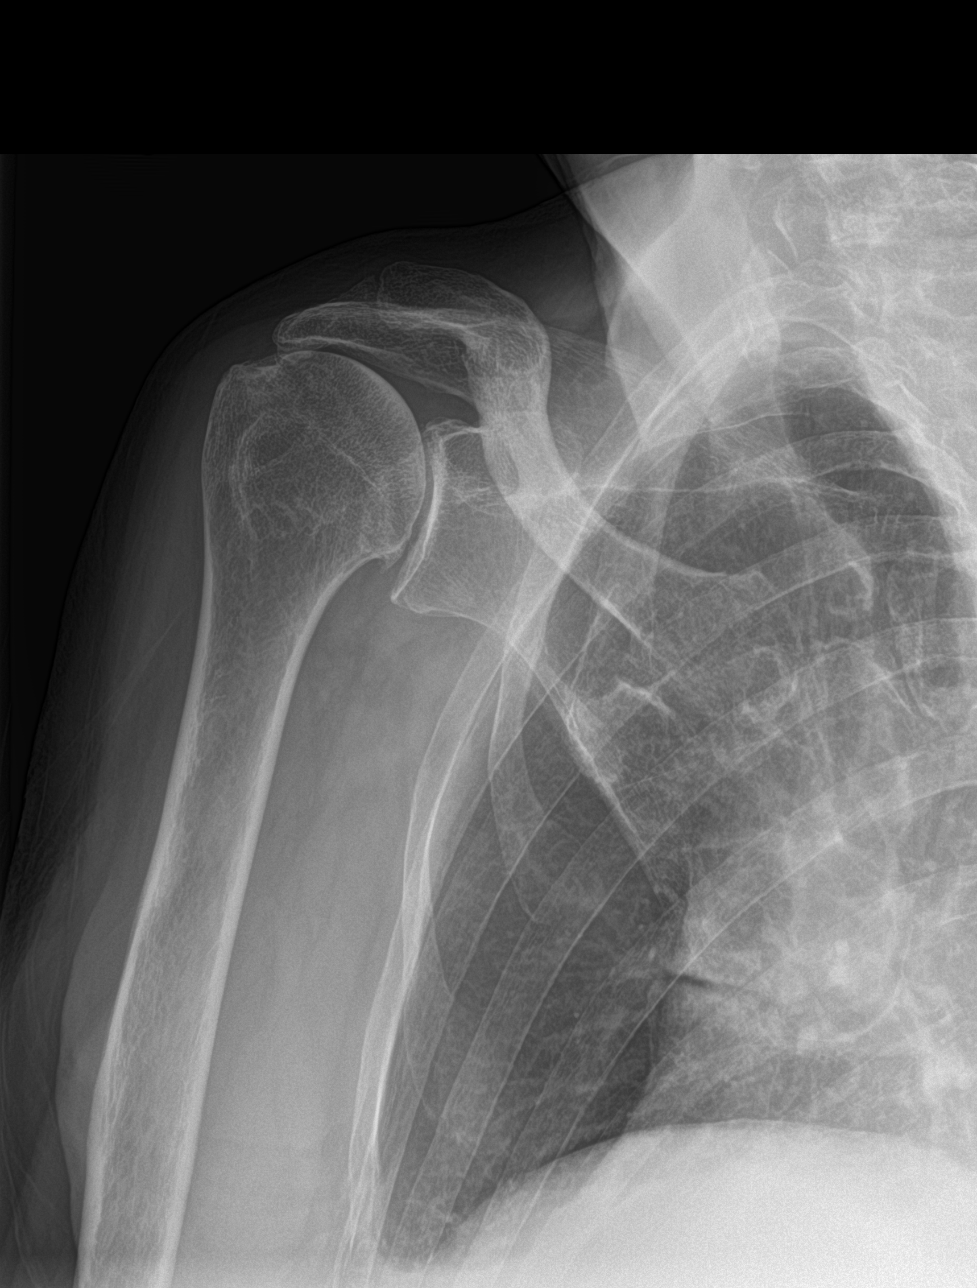

[shoulder y-view (1 of 2)]
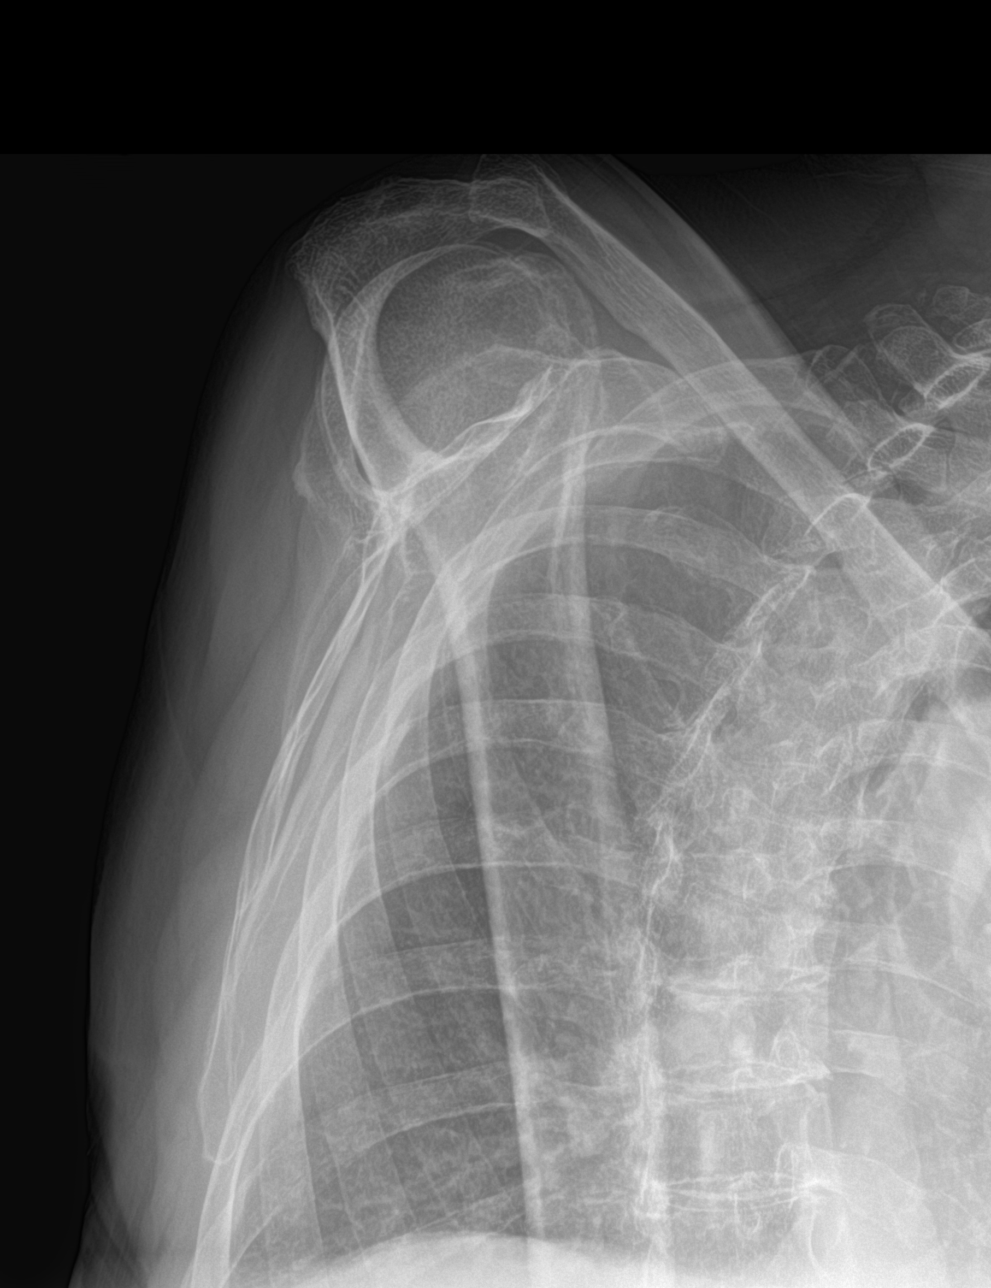

[shoulder axial]
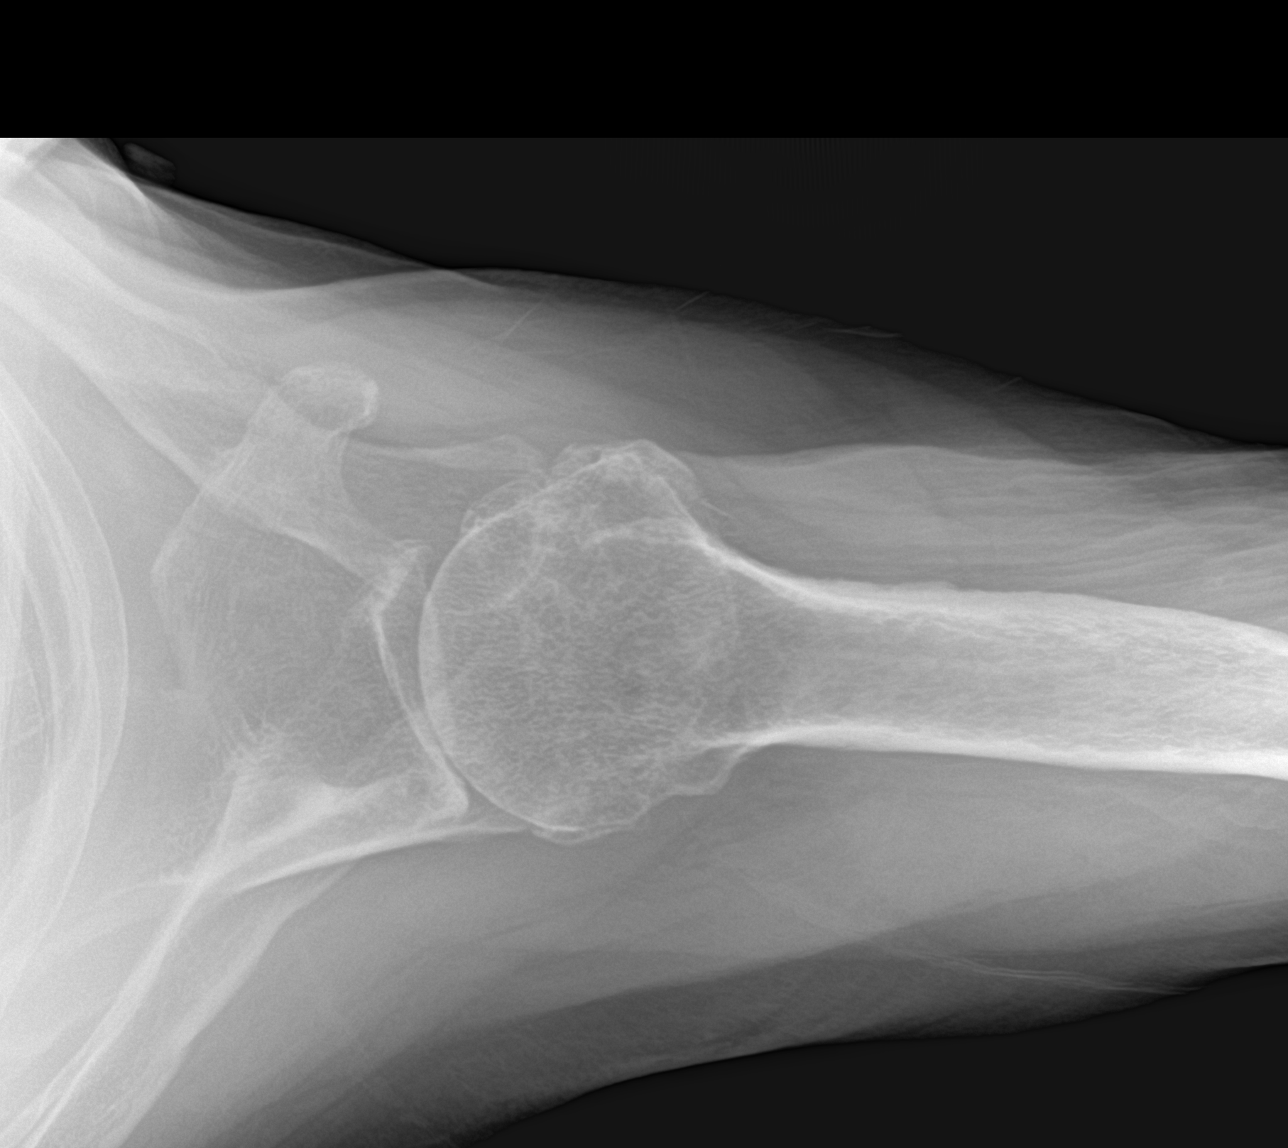

[shoulder y-view (2 of 2)]
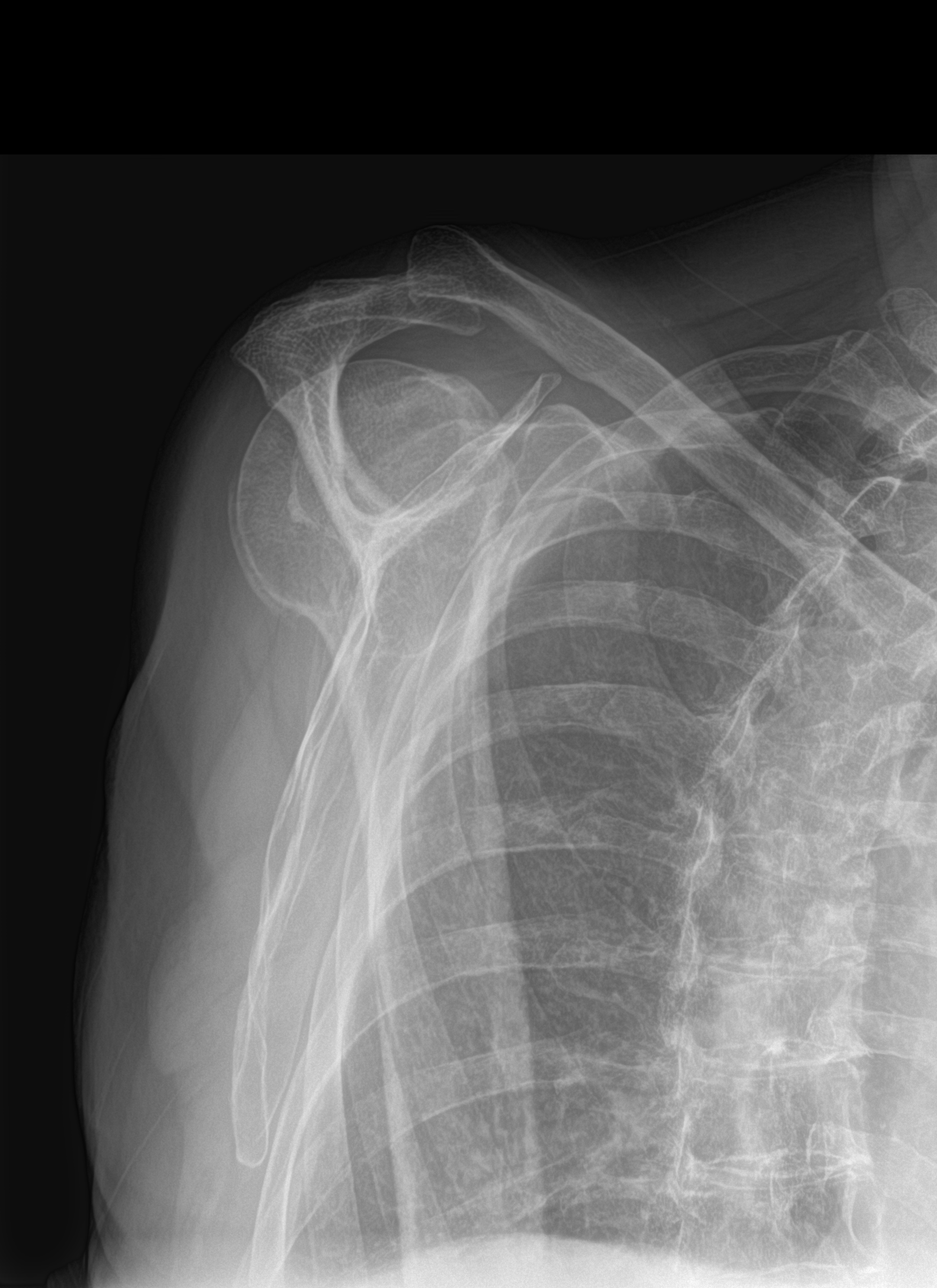

[4 of 4 positions shown; findings below may reference images not displayed]

FINDINGS: There is moderate degenerative changes involving the glenohumeral
joint. No acute fracture or dislocation identified. No radio-opaque
foreign bodies or soft tissue calculations.
IMPRESSION: No acute findings

Glenohumeral joint osteoarthritis.

## 2019-12-22 ENCOUNTER — Encounter: Payer: Self-pay | Admitting: Family

## 2019-12-22 ENCOUNTER — Ambulatory Visit (INDEPENDENT_AMBULATORY_CARE_PROVIDER_SITE_OTHER): Payer: Medicare HMO | Admitting: Family

## 2019-12-22 ENCOUNTER — Other Ambulatory Visit: Payer: Self-pay

## 2019-12-22 VITALS — BP 130/60 | HR 66 | Ht 70.5 in | Wt 179.0 lb

## 2019-12-22 DIAGNOSIS — I2510 Atherosclerotic heart disease of native coronary artery without angina pectoris: Secondary | ICD-10-CM | POA: Diagnosis not present

## 2019-12-22 DIAGNOSIS — I255 Ischemic cardiomyopathy: Secondary | ICD-10-CM | POA: Diagnosis not present

## 2019-12-22 DIAGNOSIS — R002 Palpitations: Secondary | ICD-10-CM

## 2019-12-22 DIAGNOSIS — I5022 Chronic systolic (congestive) heart failure: Secondary | ICD-10-CM | POA: Diagnosis not present

## 2019-12-22 DIAGNOSIS — I480 Paroxysmal atrial fibrillation: Secondary | ICD-10-CM

## 2019-12-22 DIAGNOSIS — I10 Essential (primary) hypertension: Secondary | ICD-10-CM

## 2019-12-22 DIAGNOSIS — E785 Hyperlipidemia, unspecified: Secondary | ICD-10-CM

## 2019-12-22 MED ORDER — FUROSEMIDE 20 MG PO TABS: ORAL_TABLET | ORAL | 3 refills | Status: DC

## 2019-12-22 MED ORDER — ATORVASTATIN CALCIUM 40 MG PO TABS: 40.0000 mg | ORAL_TABLET | Freq: Every day | ORAL | 3 refills | Status: DC

## 2019-12-22 MED ORDER — CARVEDILOL 12.5 MG PO TABS: 12.5000 mg | ORAL_TABLET | Freq: Two times a day (BID) | ORAL | 1 refills | Status: DC

## 2019-12-22 NOTE — Patient Instructions (Signed)
Medication Instructions:  Your physician has recommended you make the following change in your medication:   CHANGE Carvedilol to 12.5mg  two times daily A new prescription has been sent in for stronger tablet This will help you to prevent more of the PVCs or early beats.   *If you need a refill on your cardiac medications before your next appointment, please call your pharmacy*  Lab Work: No lab work today. We will request labs from your primary care doctor.   Testing/Procedures: Your EKG today shows normal sinus rhythm with PVC's (premature ventricular contractions). These are early beats in the bottom chambers of your heart. They are very common and not dangerous.    Follow-Up: At [facility], you and your health needs are our priority.  As part of our continuing mission to provide you with exceptional heart care, we have created designated Provider Care Teams.  These Care Teams include your primary Cardiologist (physician) and Advanced Practice Providers (APPs -  Physician Assistants and Nurse Practitioners) who all work together to provide you with the care you need, when you need it.  We recommend signing up for the patient portal called "MyChart".  Sign up information is provided on this After Visit Summary.  MyChart is used to connect with patients for Virtual Visits (Telemedicine).  Patients are able to view lab/test results, encounter notes, upcoming appointments, etc.  Non-urgent messages can be sent to your provider as well.   To learn more about what you can do with MyChart, go to [email or web address].    Your next appointment:   6 month(s)  The format for your next appointment:   In Person  Provider:   You may see Yvonne Kendall, MD or one of the following Advanced Practice Providers on your designated Care Team:    Nicolasa Ducking, NP  Eula Listen, PA-C  Marisue Ivan, PA-C  Other Instructions  Premature Ventricular Contraction  A premature  ventricular contraction (PVC) is a common kind of irregular heartbeat (arrhythmia). These contractions are extra heartbeats that start in the ventricles of the heart and occur too early in the normal sequence. During the PVC, the heart's normal electrical pathway is not used, so the beat is shorter and less effective. In most cases, these contractions come and go and do not require treatment. What are the causes? Common causes of the condition include:  Smoking.  Drinking alcohol.  Certain medicines.  Some illegal drugs.  Stress.  Caffeine. Certain medical conditions can also cause PVCs:  Heart failure.  Heart attack, or coronary artery disease.  Heart valve problems.  Changes in minerals in the blood (electrolytes).  Low blood oxygen levels or high carbon dioxide levels. In many cases, the cause of this condition is not known. What are the signs or symptoms? The main symptom of this condition is fast or skipped heartbeats (palpitations). Other symptoms include:  Chest pain.  Shortness of breath.  Feeling tired.  Dizziness.  Difficulty exercising. In some cases, there are no symptoms. How is this diagnosed? This condition may be diagnosed based on:  Your medical history.  A physical exam. During the exam, the health care provider will check for irregular heartbeats.  Tests, such as: ? An ECG (electrocardiogram) to monitor the electrical activity of your heart. ? An ambulatory cardiac monitor. This device records your heartbeats for 24 hours or more. ? Stress tests to see how exercise affects your heart rhythm and blood supply. ? An echocardiogram. This test uses sound waves (ultrasound) to produce  an image of your heart. ? An electrophysiology study (EPS). This test checks for electrical problems in your heart. How is this treated? Treatment for this condition depends on any underlying conditions, the type of PVCs that you are having, and how much the symptoms  are interfering with your daily life. Possible treatments include:  Avoiding things that cause premature contractions (triggers). These include caffeine and alcohol.  Taking medicines if symptoms are severe or if the extra heartbeats are frequent.  Getting treatment for underlying conditions that cause PVCs.  Having an implantable cardioverter defibrillator (ICD), if you are at risk for a serious arrhythmia. The ICD is a small device that is inserted into your chest to monitor your heartbeat. When it senses an irregular heartbeat, it sends a shock to bring the heartbeat back to normal.  Having a procedure to destroy the portion of the heart tissue that sends out abnormal signals (catheter ablation). In some cases, no treatment is required. Follow these instructions at home: Lifestyle  Do not use any products that contain nicotine or tobacco, such as cigarettes, e-cigarettes, and chewing tobacco. If you need help quitting, ask your health care provider.  Do not use illegal drugs.  Exercise regularly. Ask your health care provider what type of exercise is safe for you.  Try to get at least 7-9 hours of sleep each night, or as much as recommended by your health care provider.  Find healthy ways to manage stress. Avoid stressful situations when possible. Alcohol use  Do not drink alcohol if: ? Your health care provider tells you not to drink. ? You are pregnant, may be pregnant, or are planning to become pregnant. ? Alcohol triggers your episodes.  If you drink alcohol: ? Limit how much you use to:  0-1 drink a day for women.  0-2 drinks a day for men.  Be aware of how much alcohol is in your drink. In the [place] one drink equals one 12 oz bottle of beer (355 mL), one 5 oz glass of wine (148 mL), or one 1 oz glass of hard liquor (44 mL). General instructions  Take over-the-counter and prescription medicines only as told by your health care provider.  If caffeine triggers  episodes of PVC, do not eat, drink, or use anything with caffeine in it.  Keep all follow-up visits as told by your health care provider. This is important. Contact a health care provider if you:  Feel palpitations. Get help right away if you:  Have chest pain.  Have shortness of breath.  Have sweating for no reason.  Have nausea and vomiting.  Become light-headed or you faint. Summary  A premature ventricular contraction (PVC) is a common kind of irregular heartbeat (arrhythmia).  In most cases, these contractions come and go and do not require treatment.  You may need to wear an ambulatory cardiac monitor. This records your heartbeats for 24 hours or more.  Treatment depends on any underlying conditions, the type of PVCs that you are having, and how much the symptoms are interfering with your daily life. This information is not intended to replace advice given to you by your health care provider. Make sure you discuss any questions you have with your health care provider. Document Revised: 04/18/2018 Document Reviewed: 04/18/2018 Elsevier Patient Education  2020 [employer].

## 2019-12-22 NOTE — Progress Notes (Signed)
Office Visit    Patient Name: Raymond Rios Date of Encounter: 12/22/2019  Primary Care Provider:  Jaclyn Shaggy, MD Primary Cardiologist:  Yvonne Kendall, MD Electrophysiologist:  None   Chief Complaint    Raymond Rios is a 86 y.o. male with a hx of CAD with late presenting anterior STEMI managed medically 05/2017, chronic systolic heart failure due to ICM, PAF, HTN, HLD, COPD, myasthenia gravis presents today for follow-up of CAD and heart failure  Past Medical History    Past Medical History:  Diagnosis Date  . Allergy   . Anemia   . Arthritis   . CAD (coronary artery disease)    a. late presenting anterior STEMI 05/29/17 Cath: thrombotic occlusion of pLAD, dominant LCx, OM3 50%, lpda 80%, ant & apical AK with EF 35%.    . Chronic systolic heart failure (HCC)   . Depression   . GERD (gastroesophageal reflux disease)   . History of chickenpox   . History of colon polyps   . Hyperlipidemia   . Hypertension   . Ischemic cardiomyopathy    a. late presenting anterior STEMI 05/29/2017, TTE 05/30/17: EF 30-35%, AK of the mid-apicalanteroseptal, anterior, antlat, & apical wall, Gr1DD; c. 08/2017 Echo: EF 30-35%, mid-apicalanteroseptal, ant, apical AK, Gr1 DD.  . Myasthenia gravis (HCC)   . PAF (paroxysmal atrial fibrillation) (HCC)    a, newly diagnosed 06/01/2017; b. CHADS2VASC => 5 (CHF, HTN, age x 2, vascular disease)-->No OAC in the setting of ongoing ASA/Plavix, mild dementia, lives alone.  On Amio.  Marland Kitchen Urine incontinence    Past Surgical History:  Procedure Laterality Date  . CARDIAC CATHETERIZATION    . CHOLECYSTECTOMY    . LEFT HEART CATH AND CORONARY ANGIOGRAPHY N/A 05/29/2017   Procedure: LEFT HEART CATH AND CORONARY ANGIOGRAPHY;  Surgeon: Yvonne Kendall, MD;  Location: [facility] INVASIVE CV LAB;  Service: Cardiovascular;  Laterality: N/A;  . TONSILLECTOMY AND ADENOIDECTOMY      Allergies  Allergies  Allergen Reactions  . Other Hives  . Sulfonamide Derivatives    REACTION: unspecified    History of Present Illness    Raymond Rios is a 86 y.o. male with a hx of  CAD with late presenting anterior STEMI managed medically 05/2017, chronic systolic heart failure due to ICM, PAF, HTN, HLD, COPD, myasthenia gravis.  He was last seen 05/05/2019 by Dr. Okey Dupre.  Presenting anterior STEMI October 2018 with echo at the time showing LVEF 30-35%.  Catheterization showing thrombotic occlusion of LAD and otherwise nonobstructive disease.  Recommend for medical therapy given late presentation and absence of chest pain.  During hospitalization he had PAF and was placed on amiodarone therapy with conversion to NSR.  Oral anticoagulation not initiated as he was on DAPT, has dementia, lives alone.  Follow-up echo January 2019 with LVEF 30-35%.  Has previously declined ICD.  Clinic visit 01/2019 he was started on as needed Lasix for lower extremity edema.  He was offered cardiac monitoring for palpitations but declined and did not wish to begin anticoagulation with history of PAF.  Follow-up plan/2020 with Dr. Okey Dupre reported feeling his heart sometimes goes out of rhythm when trying to sleep at night occurring once or twice a week lasting a few hours.  He again wished to defer and will try cardiac event monitor and therapeutic anticoagulation.  Notices palpitations in the evening that feel like a forceful heart beat. Not associated with shortness of breath or pain.  We discussed that monitoring  which he continues to climb.  Tells me if he walks he notices some dyspnea but only if he does move than usual activity.  He does live in [place].  He has a family member present with him for his visit today.    Reports his edema is stable. Takes his Lasix about 3 times per week. Does not elevate legs when sitting.     EKGs/Labs/Other Studies Reviewed:   The following studies were reviewed today:  EKG:  EKG is ordered today.  The ekg ordered today demonstrates sinus rhythm 66 bpm with  frequent PVC (3 PVCs) and T wave inversion in lead V2, V3  Recent Labs: No results found for requested labs within last 8760 hours.  Recent Lipid Panel    Component Value Date/Time   CHOL 89 05/30/2017 0827   TRIG 44 05/30/2017 0827   HDL 46 05/30/2017 0827   CHOLHDL 1.9 05/30/2017 0827   VLDL 9 05/30/2017 0827   LDLCALC 34 05/30/2017 0827   LDLDIRECT 190.3 08/23/2009 0819    Home Medications   No outpatient medications have been marked as taking for the 12/22/19 encounter (Appointment) with Loel Dubonnet, NP.    Review of Systems      Review of Systems  Constitution: Negative for chills, fever and malaise/fatigue.  Cardiovascular: Positive for dyspnea on exertion and palpitations. Negative for chest pain, leg swelling, near-syncope, orthopnea and syncope.  Respiratory: Negative for cough, shortness of breath and wheezing.   Gastrointestinal: Negative for nausea and vomiting.  Neurological: Negative for dizziness, light-headedness and weakness.   All other systems reviewed and are otherwise negative except as noted above.  Physical Exam    VS:  There were no vitals taken for this visit. , BMI There is no height or weight on file to calculate BMI. GEN: Well nourished, well developed, in no acute distress. HEENT: normal. Neck: Supple, no JVD, carotid bruits, or masses. Cardiac: irregular, no murmurs, rubs, or gallops. No clubbing, cyanosis, edema.  Radials/DP/PT 2+ and equal bilaterally.  Respiratory:  Respirations regular and unlabored, clear to auscultation bilaterally. GI: Soft, nontender, nondistended, BS + x 4. MS: No deformity or atrophy. Skin: Warm and dry, no rash. Neuro:  Strength and sensation are intact. Psych: Normal affect.    Assessment & Plan    1. CAD -stable with no anginal symptoms.  EKG today with no acute ST/T wave changes.  No indication for ischemic evaluation at this time.  GDM includes aspirin, beta-blocker, statin.  2. Chronic systolic  heart failure due to ICM -euvolemic and well compensated. NYHA II.  Last echo 2019 with LVEF 30 to 35%.  He continues to decline ICD.  GDMT includes beta-blocker, Lasix.  BP will not tolerate addition of ACE/ARB/ARNI.  3. Palpitations/PAF/PVC -reports forceful heartbeats when lying down at night.  EKG today shows frequent PVCs (he declines monitoring.  He again declines anticoagulation in the setting of history of PAF.  Anticipate his palpitations are symptomatic PVCs.  Increase Coreg to 12.5 mg twice daily.  4. HLD, LDL goal less than 70 -continue atorvastatin 40 mg daily.  Tells me his primary care checked his labs recently and we will request.  5. HTN -BP well controlled continue present antihypertensive regimen.  Disposition: Follow up in 6 month(s) with Dr. Benjaman Pott, NP 12/22/2019, 12:54 PM

## 2020-06-29 NOTE — Progress Notes (Signed)
Follow-up Outpatient Visit Date: 06/30/2020  Primary Care Provider: Jaclyn Shaggy, MD [address]   [place] [address]  Chief Complaint: Follow-up coronary artery disease and cardiomyopathy  HPI:  Mr. Donigan is a 86 y.o. male with history of CAD with late presenting anterior STEMI managed medically (05/2017), chronic HFrEF due to ischemic cardiomyopathy, paroxysmal atrial fibrillation, HTN, HLD, COPD, and myasthenia gravis, who presents for follow-up of CAD, HFrEF, and PAF.  He was last seen in our office by Gillian Shields, NP, in May, at which time Mr. Phang noted occasional palpitations.  He declined an event monitor.  Carvedilol was increased to 12.5 mg twice daily.  Today, Mr. Czerniak reports that he continues to have intermittent palpitations. He describes it as a sensation of his heart "pounding" harder than normal. It is not faster or associated with other symptoms. It usually happens at night and sometimes wakes him up. It resolves after he takes a deep breath. He denies chest pain, shortness of breath, and edema. Mr. Karen Kitchens continues to have intermittent orthostatic lightheadedness but has not passed out. He does not feel any better since carvedilol was increased at his last visit in May.  Mr. Traynham reports that he recovered from COVID-19 infection (previously immunized) last month. His main symptom was a sore throat. He did not have any chest pain or dyspnea with COVID-19.  --------------------------------------------------------------------------------------------------  Past Medical History:  Diagnosis Date   Allergy    Anemia    Arthritis    CAD (coronary artery disease)    a. late presenting anterior STEMI 05/29/17 Cath: thrombotic occlusion of pLAD, dominant LCx, OM3 50%, lpda 80%, ant & apical AK with EF 35%.     Chronic systolic heart failure (HCC)    Depression    GERD (gastroesophageal reflux disease)    History of chickenpox    History of colon polyps     Hyperlipidemia    Hypertension    Ischemic cardiomyopathy    a. late presenting anterior STEMI 05/29/2017, TTE 05/30/17: EF 30-35%, AK of the mid-apicalanteroseptal, anterior, antlat, & apical wall, Gr1DD; c. 08/2017 Echo: EF 30-35%, mid-apicalanteroseptal, ant, apical AK, Gr1 DD.   Myasthenia gravis (HCC)    PAF (paroxysmal atrial fibrillation) (HCC)    a, newly diagnosed 06/01/2017; b. CHADS2VASC => 5 (CHF, HTN, age x 2, vascular disease)-->No OAC in the setting of ongoing ASA/Plavix, mild dementia, lives alone.  On Amio.   Urine incontinence    Past Surgical History:  Procedure Laterality Date   CARDIAC CATHETERIZATION     CHOLECYSTECTOMY     LEFT HEART CATH AND CORONARY ANGIOGRAPHY N/A 05/29/2017   Procedure: LEFT HEART CATH AND CORONARY ANGIOGRAPHY;  Surgeon: Yvonne Kendall, MD;  Location: [facility] INVASIVE CV LAB;  Service: Cardiovascular;  Laterality: N/A;   TONSILLECTOMY AND ADENOIDECTOMY      Current Meds  Medication Sig   aspirin EC 81 MG tablet Take 1 tablet (81 mg total) by mouth daily. Start once you have finished your plavix.   atorvastatin (LIPITOR) 40 MG tablet Take 1 tablet (40 mg total) by mouth daily at 6 PM.   carvedilol (COREG) 12.5 MG tablet Take 1 tablet (12.5 mg total) by mouth 2 (two) times daily.   furosemide (LASIX) 20 MG tablet Take 20 mg (1 tablet) by mouth once a day for 1 week, then take 1 tablet once a day as needed for swelling or weight gain.   levothyroxine (SYNTHROID, LEVOTHROID) 75 MCG tablet Take 75 mcg  by mouth every morning.   PARoxetine (PAXIL) 10 MG tablet Take 10 mg by mouth daily.   tamsulosin (FLOMAX) 0.4 MG CAPS capsule Take 0.4 mg by mouth daily.    Allergies: Other and Sulfonamide derivatives  Social History   Tobacco Use   Smoking status: Former Smoker   Smokeless tobacco: Former Neurosurgeon   Tobacco comment: quit around 1970's  Vaping Use   Vaping Use: Never used  Substance Use Topics   Alcohol use: No   Drug  use: No    Family History  Problem Relation Age of Onset   Hyperlipidemia Mother    Arthritis Father    Hyperlipidemia Father     Review of Systems: A 12-system review of systems was performed and was negative except as noted in the HPI.  --------------------------------------------------------------------------------------------------  Physical Exam: BP 100/60 (BP Location: Left Arm, Patient Position: Sitting, Cuff Size: Normal)    Pulse (!) 56    Ht 5\' 7"  (1.702 m)    Wt 180 lb 8 oz (81.9 kg)    SpO2 97%    BMI 28.27 kg/m   General: NAD. Accompanied by his son. Neck: No JVD or HJR. Lungs: Clear to auscultation bilaterally without wheezes or crackles. Heart: Bradycardic but regular without murmurs, rubs, or gallops. Abdomen: Soft, nontender, nondistended. Extremities: Trace ankle edema.  EKG: Sinus bradycardia with septal infarct and nonspecific T wave changes. Compared with prior tracing from 12/22/2019, PVCs are no longer present. Otherwise, there has been no significant interval change.  Lab Results  Component Value Date   WBC 8.5 06/01/2017   HGB 11.2 (L) 06/01/2017   HCT 33.4 (L) 06/01/2017   MCV 86.1 06/01/2017   PLT 112 (L) 06/01/2017    Lab Results  Component Value Date   NA 140 02/11/2018   K 4.2 02/11/2018   CL 103 02/11/2018   CO2 23 02/11/2018   BUN 21 02/11/2018   CREATININE 0.99 02/11/2018   GLUCOSE 92 02/11/2018   ALT 57 05/29/2017    Lab Results  Component Value Date   CHOL 89 05/30/2017   HDL 46 05/30/2017   LDLCALC 34 05/30/2017   LDLDIRECT 190.3 08/23/2009   TRIG 44 05/30/2017   CHOLHDL 1.9 05/30/2017    --------------------------------------------------------------------------------------------------  ASSESSMENT AND PLAN: Coronary artery disease: Ms. Lant has not had any symptoms to suggest worsening coronary insufficiency. We will continue his current medications for secondary prevention, including aspirin and atorvastatin. We  will decrease carvedilol to reduce the potential for orthostatic lightheadedness/hypotension.  Chronic HFrEF secondary to ischemic cardiomyopathy: Mr. Emond denies heart failure symptoms and appears euvolemic on exam other than chronic trace ankle edema. Due to intermittent lightheadedness and orthostatic hypotension noted today, I will decrease carvedilol back to 6.25 mg twice daily. He is not on an ACE inhibitor or ARB in the setting of his orthostatic hypotension and lightheadedness. He can continue to use furosemide 20 mg daily as needed for edema/weight gain.  Orthostatic hypotension: Likely multifactorial including medication effects and reduced LVEF. An element of autonomic dysfunction is likely contributing as well. I encouraged Mr. Kohlmeyer to stand up slowly and to maintain adequate hydration. We will decrease carvedilol to 6.25 mg twice daily.  Palpitations and paroxysmal atrial fibrillation: I suspect these are most likely due to PVCs. We discussed further evaluation with ambulatory event monitoring, though Mr. Rayford wishes to defer this. Given that symptoms did not improve with escalation of carvedilol and he exhibits mild sinus bradycardia today as well as orthostatic hypotension,  we will decrease carvedilol to 6.25 mg twice daily. He has previously declined anticoagulation. Given no clear evidence of recurrence, we will continue with aspirin.  Hyperlipidemia: Lipid panel through Dr. Maree Krabbe office last month showed good lipid control with a total cholesterol of 105 and LDL of 51. Triglycerides were 72. We will plan to continue atorvastatin 40 mg daily.  Follow-up: Return to clinic in 6 months.  Yvonne Kendall, MD 06/30/2020 1:36 PM

## 2020-06-30 ENCOUNTER — Other Ambulatory Visit: Payer: Self-pay

## 2020-06-30 ENCOUNTER — Ambulatory Visit: Payer: Medicare HMO | Admitting: Internal Medicine

## 2020-06-30 ENCOUNTER — Encounter: Payer: Self-pay | Admitting: Internal Medicine

## 2020-06-30 VITALS — BP 100/60 | HR 56 | Ht 67.0 in | Wt 180.5 lb

## 2020-06-30 DIAGNOSIS — I255 Ischemic cardiomyopathy: Secondary | ICD-10-CM | POA: Diagnosis not present

## 2020-06-30 DIAGNOSIS — I10 Essential (primary) hypertension: Secondary | ICD-10-CM

## 2020-06-30 DIAGNOSIS — I2510 Atherosclerotic heart disease of native coronary artery without angina pectoris: Secondary | ICD-10-CM

## 2020-06-30 DIAGNOSIS — I5022 Chronic systolic (congestive) heart failure: Secondary | ICD-10-CM

## 2020-06-30 DIAGNOSIS — I480 Paroxysmal atrial fibrillation: Secondary | ICD-10-CM

## 2020-06-30 DIAGNOSIS — R002 Palpitations: Secondary | ICD-10-CM

## 2020-06-30 DIAGNOSIS — E785 Hyperlipidemia, unspecified: Secondary | ICD-10-CM

## 2020-06-30 MED ORDER — CARVEDILOL 6.25 MG PO TABS: 6.2500 mg | ORAL_TABLET | Freq: Two times a day (BID) | ORAL | 3 refills | Status: DC

## 2020-06-30 MED ORDER — CARVEDILOL 6.25 MG PO TABS: 6.2500 mg | ORAL_TABLET | Freq: Two times a day (BID) | ORAL | 5 refills | Status: DC

## 2020-06-30 NOTE — Patient Instructions (Signed)
Medication Instructions:   Your physician has recommended you make the following change in your medication:   DECREASE your Carvedilol (Coreg) to 6.25 MG one tablet twice a day.  *If you need a refill on your cardiac medications before your next appointment, please call your pharmacy*   Lab Work: None Ordered If you have labs (blood work) drawn today and your tests are completely normal, you will receive your results only by: Marland Kitchen MyChart Message (if you have MyChart) OR . A paper copy in the mail If you have any lab test that is abnormal or we need to change your treatment, we will call you to review the results.   Testing/Procedures: None Ordered   Follow-Up: At [facility], you and your health needs are our priority.  As part of our continuing mission to provide you with exceptional heart care, we have created designated Provider Care Teams.  These Care Teams include your primary Cardiologist (physician) and Advanced Practice Providers (APPs -  Physician Assistants and Nurse Practitioners) who all work together to provide you with the care you need, when you need it.  We recommend signing up for the patient portal called "MyChart".  Sign up information is provided on this After Visit Summary.  MyChart is used to connect with patients for Virtual Visits (Telemedicine).  Patients are able to view lab/test results, encounter notes, upcoming appointments, etc.  Non-urgent messages can be sent to your provider as well.   To learn more about what you can do with MyChart, go to [email or web address].    Your next appointment:   6 month(s)  The format for your next appointment:   In Person  Provider:   You may see Yvonne Kendall, MD or one of the following Advanced Practice Providers on your designated Care Team:    Nicolasa Ducking, NP  Eula Listen, PA-C  Marisue Ivan, PA-C  Cadence Wentworth, [place]  Gillian Shields, NP    Other Instructions

## 2020-07-02 ENCOUNTER — Encounter: Payer: Self-pay | Admitting: Internal Medicine

## 2020-07-02 DIAGNOSIS — R002 Palpitations: Secondary | ICD-10-CM | POA: Insufficient documentation

## 2020-07-02 HISTORY — DX: Palpitations: R00.2

## 2020-09-15 ENCOUNTER — Other Ambulatory Visit: Payer: Self-pay | Admitting: Family

## 2020-12-29 ENCOUNTER — Encounter: Payer: Self-pay | Admitting: Internal Medicine

## 2020-12-29 ENCOUNTER — Ambulatory Visit: Payer: Medicare HMO | Admitting: Internal Medicine

## 2020-12-29 ENCOUNTER — Other Ambulatory Visit: Payer: Self-pay

## 2020-12-29 VITALS — BP 130/80 | HR 60 | Ht 70.0 in | Wt 189.0 lb

## 2020-12-29 DIAGNOSIS — I10 Essential (primary) hypertension: Secondary | ICD-10-CM | POA: Diagnosis not present

## 2020-12-29 DIAGNOSIS — I2510 Atherosclerotic heart disease of native coronary artery without angina pectoris: Secondary | ICD-10-CM

## 2020-12-29 DIAGNOSIS — I5022 Chronic systolic (congestive) heart failure: Secondary | ICD-10-CM | POA: Diagnosis not present

## 2020-12-29 DIAGNOSIS — E785 Hyperlipidemia, unspecified: Secondary | ICD-10-CM

## 2020-12-29 DIAGNOSIS — I255 Ischemic cardiomyopathy: Secondary | ICD-10-CM

## 2020-12-29 MED ORDER — ATORVASTATIN CALCIUM 40 MG PO TABS: 40.0000 mg | ORAL_TABLET | Freq: Every day | ORAL | 1 refills | Status: DC

## 2020-12-29 MED ORDER — CARVEDILOL 6.25 MG PO TABS: 6.2500 mg | ORAL_TABLET | Freq: Two times a day (BID) | ORAL | 1 refills | Status: DC

## 2020-12-29 NOTE — Patient Instructions (Signed)
Medication Instructions:  - Your physician has recommended you make the following change in your medication:   1) Take lasix 20 mg - 1 tablet once day x 2 days, then resume taking 1 tablet daily as needed  *If you need a refill on your cardiac medications before your next appointment, please call your pharmacy*   Lab Work: - none ordered  If you have labs (blood work) drawn today and your tests are completely normal, you will receive your results only by: Marland Kitchen MyChart Message (if you have MyChart) OR . A paper copy in the mail If you have any lab test that is abnormal or we need to change your treatment, we will call you to review the results.   Testing/Procedures: - none ordered   Follow-Up: At [facility], you and your health needs are our priority.  As part of our continuing mission to provide you with exceptional heart care, we have created designated Provider Care Teams.  These Care Teams include your primary Cardiologist (physician) and Advanced Practice Providers (APPs -  Physician Assistants and Nurse Practitioners) who all work together to provide you with the care you need, when you need it.  We recommend signing up for the patient portal called "MyChart".  Sign up information is provided on this After Visit Summary.  MyChart is used to connect with patients for Virtual Visits (Telemedicine).  Patients are able to view lab/test results, encounter notes, upcoming appointments, etc.  Non-urgent messages can be sent to your provider as well.   To learn more about what you can do with MyChart, go to [email or web address].    Your next appointment:   6 months  The format for your next appointment:   In Person  Provider:   You may see Yvonne Kendall, MD or one of the following Advanced Practice Providers on your designated Care Team:    Nicolasa Ducking, NP  Eula Listen, PA-C  Marisue Ivan, PA-C  Cadence [place], [place]  Gillian Shields, NP    Other  Instructions n/a

## 2020-12-29 NOTE — Progress Notes (Addendum)
Follow-up Outpatient Visit Date: 12/29/2020  Primary Care Provider: Jaclyn Shaggy, MD [address]   [place] [address]  Chief Complaint: Follow-up heart failure and coronary artery disease  HPI:  [name] is a 87 y.o. male with history of CAD with late presenting anterior STEMI managed medically (05/2017), chronic HFrEF due to ischemic cardiomyopathy, paroxysmal atrial fibrillation, HTN, HLD, COPD, and myasthenia gravis, who presents for follow-up of coronary artery disease and heart failure.  I last saw him in 06/2020, at which time he reported intermittent palpitations as well as orthostatic lightheadedness.  Due to bradycardia and lightheadedness, we agreed to decrease carvedilol to 6.25 mg twice daily.  Raymond Rios did not wish to pursue event monitoring for further work-up of his palpitations.  Today, Raymond Rios reports that he feels about the same.  He has frequent dizziness and at times also feels like his heart is beating a little faster than usual.  On further questioning, it sounds like his dizziness is most related to double vision and eye problems.  He does not have orthostatic lightheadedness.  He notes that his palpitations most often occur at night.  He does not have any chest pain or shortness of breath.  He is trying to do some modest exercise but feels like his stamina is somewhat limited.  He has noticed his legs getting stronger with routine stair climbing.  He is using his as needed furosemide less than once a week.  --------------------------------------------------------------------------------------------------  Past Medical History:  Diagnosis Date  . Allergy   . Anemia   . Arthritis   . CAD (coronary artery disease)    a. late presenting anterior STEMI 05/29/17 Cath: thrombotic occlusion of pLAD, dominant LCx, OM3 50%, lpda 80%, ant & apical AK with EF 35%.    . Chronic systolic heart failure (HCC)   . Depression   . GERD (gastroesophageal reflux disease)    . History of chickenpox   . History of colon polyps   . Hyperlipidemia   . Hypertension   . Ischemic cardiomyopathy    a. late presenting anterior STEMI 05/29/2017, TTE 05/30/17: EF 30-35%, AK of the mid-apicalanteroseptal, anterior, antlat, & apical wall, Gr1DD; c. 08/2017 Echo: EF 30-35%, mid-apicalanteroseptal, ant, apical AK, Gr1 DD.  . Myasthenia gravis (HCC)   . PAF (paroxysmal atrial fibrillation) (HCC)    a, newly diagnosed 06/01/2017; b. CHADS2VASC => 5 (CHF, HTN, age x 2, vascular disease)-->No OAC in the setting of ongoing ASA/Plavix, mild dementia, lives alone.  On Amio.  Marland Kitchen Urine incontinence    Past Surgical History:  Procedure Laterality Date  . CARDIAC CATHETERIZATION    . CHOLECYSTECTOMY    . LEFT HEART CATH AND CORONARY ANGIOGRAPHY N/A 05/29/2017   Procedure: LEFT HEART CATH AND CORONARY ANGIOGRAPHY;  Surgeon: Yvonne Kendall, MD;  Location: [facility] INVASIVE CV LAB;  Service: Cardiovascular;  Laterality: N/A;  . TONSILLECTOMY AND ADENOIDECTOMY     Recent CV Pertinent Labs: Lab Results  Component Value Date   CHOL 89 05/30/2017   HDL 46 05/30/2017   LDLCALC 34 05/30/2017   LDLDIRECT 190.3 08/23/2009   TRIG 44 05/30/2017   CHOLHDL 1.9 05/30/2017   INR 1.04 05/29/2017   K 4.2 02/11/2018   MG 1.9 12/25/2017   BUN 21 02/11/2018   CREATININE 0.99 02/11/2018    Past medical and surgical history were reviewed and updated in EPIC.  Current Meds  Medication Sig  . aspirin EC 81 MG tablet Take 1 tablet (81  mg total) by mouth daily. Start once you have finished your plavix.  Marland Kitchen atorvastatin (LIPITOR) 40 MG tablet Take 1 tablet (40 mg total) by mouth daily at 6 PM.  . carvedilol (COREG) 6.25 MG tablet Take 1 tablet (6.25 mg total) by mouth 2 (two) times daily.  . furosemide (LASIX) 20 MG tablet Take 20 mg by mouth daily as needed.  Marland Kitchen levothyroxine (SYNTHROID, LEVOTHROID) 75 MCG tablet Take 75 mcg by mouth every morning.  Marland Kitchen PARoxetine (PAXIL) 10 MG tablet Take 10 mg by  mouth daily.  . tamsulosin (FLOMAX) 0.4 MG CAPS capsule Take 0.4 mg by mouth daily.    Allergies: Other and Sulfonamide derivatives  Social History   Tobacco Use  . Smoking status: Former Games developer  . Smokeless tobacco: Former Neurosurgeon  . Tobacco comment: quit around 1970's  Vaping Use  . Vaping Use: Never used  Substance Use Topics  . Alcohol use: No  . Drug use: No    Family History  Problem Relation Age of Onset  . Hyperlipidemia Mother   . Arthritis Father   . Hyperlipidemia Father     Review of Systems: A 12-system review of systems was performed and was negative except as noted in the HPI.  --------------------------------------------------------------------------------------------------  Physical Exam: BP 130/80 (BP Location: Left Arm, Patient Position: Sitting, Cuff Size: Large)   Pulse 60   Ht 5\' 10"  (1.778 m)   Wt 189 lb (85.7 kg)   SpO2 95%   BMI 27.12 kg/m  ReDS Vest: 35%  General:  NAD.  Accompanied by his son. Neck: No JVD or HJR. Lungs: Clear to auscultation bilaterally without wheezes or crackles. Heart: Regular rate and rhythm without murmurs, rubs, or gallops. Abdomen: Soft, nontender, nondistended. Extremities: No lower extremity edema.  EKG: Normal sinus rhythm with PVC, septal infarct, and lateral T wave inversions.  PVC is new.  Otherwise, no significant change from prior tracing on 06/30/2020.  Lab Results  Component Value Date   WBC 8.5 06/01/2017   HGB 11.2 (L) 06/01/2017   HCT 33.4 (L) 06/01/2017   MCV 86.1 06/01/2017   PLT 112 (L) 06/01/2017    Lab Results  Component Value Date   NA 140 02/11/2018   K 4.2 02/11/2018   CL 103 02/11/2018   CO2 23 02/11/2018   BUN 21 02/11/2018   CREATININE 0.99 02/11/2018   GLUCOSE 92 02/11/2018   ALT 57 05/29/2017    Lab Results  Component Value Date   CHOL 89 05/30/2017   HDL 46 05/30/2017   LDLCALC 34 05/30/2017   LDLDIRECT 190.3 08/23/2009   TRIG 44 05/30/2017   CHOLHDL 1.9 05/30/2017     --------------------------------------------------------------------------------------------------  ASSESSMENT AND PLAN: Chronic HFrEF: Raymond Rios appears euvolemic and has borderline elevated Reds vest reading today.  He reports NYHA class II heart failure symptoms.  I have encouraged him to take his as needed furosemide 20 mg for the next 2 days and then continue on an as needed basis as before.  He should continue to monitor his weights at home to ensure that they are not climbing.  We will continue carvedilol 6.25 mg twice daily.  He has not been tolerant of ACE inhibitor's or ARB's in the past due to orthostatic hypotension.  Coronary artery disease: No angina reported.  EKG shows stable findings of septal infarct with lateral T wave inversions, consistent with his late presenting anterior STEMI with occluded LAD in 2018.  We will defer medication changes today.  Continue secondary  prevention with aspirin, carvedilol, and atorvastatin.  Hyperlipidemia: Lipids well controlled on last check in October.  Continue atorvastatin 40 mg daily.  Follow-up: Return to clinic in 6 months.  Yvonne Kendall, MD 12/29/2020 1:35 PM

## 2020-12-29 NOTE — Progress Notes (Signed)
ReDS Vest / Clip - 12/29/20 1321      ReDS Vest / Clip   Station Marker C    Ruler Value 30    ReDS Value Range Low volume    ReDS Actual Value 35

## 2020-12-30 ENCOUNTER — Encounter: Payer: Self-pay | Admitting: Internal Medicine

## 2021-05-17 ENCOUNTER — Ambulatory Visit: Payer: Medicare HMO | Admitting: Podiatry

## 2021-05-17 ENCOUNTER — Encounter: Payer: Self-pay | Admitting: Podiatry

## 2021-05-17 ENCOUNTER — Other Ambulatory Visit: Payer: Self-pay

## 2021-05-17 DIAGNOSIS — B351 Tinea unguium: Secondary | ICD-10-CM

## 2021-05-17 DIAGNOSIS — M79675 Pain in left toe(s): Secondary | ICD-10-CM | POA: Diagnosis not present

## 2021-05-17 DIAGNOSIS — M79674 Pain in right toe(s): Secondary | ICD-10-CM

## 2021-05-18 ENCOUNTER — Other Ambulatory Visit: Payer: Self-pay

## 2021-05-18 MED ORDER — FUROSEMIDE 20 MG PO TABS: 20.0000 mg | ORAL_TABLET | Freq: Every day | ORAL | 0 refills | Status: DC | PRN

## 2021-05-18 NOTE — Telephone Encounter (Signed)
Requested Prescriptions   Pending Prescriptions Disp Refills   furosemide (LASIX) 20 MG tablet 90 tablet 0    Sig: Take 1 tablet (20 mg total) by mouth daily as needed.

## 2021-05-18 NOTE — Telephone Encounter (Signed)
*  STAT* If patient is at the pharmacy, call can be transferred to refill team.   1. Which medications need to be refilled? (please list name of each medication and dose if known) Lasix  2. Which pharmacy/location (including street and city if local pharmacy) is medication to be sent to? [facility]  3. Do they need a 30 day or 90 day supply? 90

## 2021-05-23 ENCOUNTER — Encounter: Payer: Self-pay | Admitting: Podiatry

## 2021-05-23 NOTE — Progress Notes (Signed)
  Subjective:  Patient ID: Raymond Rios, male    DOB: 02/03/1931,  MRN: [id number]  Chief Complaint  Patient presents with   Nail Problem    Nail trim    87 y.o. male returns for the above complaint.  Patient presents with thickened elongated dystrophic toenails x10.  Mild pain on palpation.  Patient would like to have them debrided down.  He is not able to do it himself.  He is not a diabetic  Objective:  There were no vitals filed for this visit. Podiatric Exam: Vascular: dorsalis pedis and posterior tibial pulses are palpable bilateral. Capillary return is immediate. Temperature gradient is WNL. Skin turgor WNL  Sensorium: Normal Semmes Weinstein monofilament test. Normal tactile sensation bilaterally. Nail Exam: Pt has thick disfigured discolored nails with subungual debris noted bilateral entire nail hallux through fifth toenails.  Pain on palpation to the nails. Ulcer Exam: There is no evidence of ulcer or pre-ulcerative changes or infection. Orthopedic Exam: Muscle tone and strength are WNL. No limitations in general ROM. No crepitus or effusions noted.  Skin: No Porokeratosis. No infection or ulcers    Assessment & Plan:   1. Pain due to onychomycosis of toenails of both feet     Patient was evaluated and treated and all questions answered.  Onychomycosis with pain  -Nails palliatively debrided as below. -Educated on self-care  Procedure: Nail Debridement Rationale: pain  Type of Debridement: manual, sharp debridement. Instrumentation: Nail nipper, rotary burr. Number of Nails: 10  Procedures and Treatment: Consent by patient was obtained for treatment procedures. The patient understood the discussion of treatment and procedures well. All questions were answered thoroughly reviewed. Debridement of mycotic and hypertrophic toenails, 1 through 5 bilateral and clearing of subungual debris. No ulceration, no infection noted.  Return Visit-Office Procedure: Patient instructed  to return to the office for a follow up visit 3 months for continued evaluation and treatment.  Nicholes Rough, DPM    Return in about 3 months (around 08/17/2021) for [facility] .

## 2021-06-24 ENCOUNTER — Ambulatory Visit: Payer: Medicare HMO | Admitting: Nurse Practitioner

## 2021-06-24 ENCOUNTER — Encounter: Payer: Self-pay | Admitting: Nurse Practitioner

## 2021-06-24 ENCOUNTER — Other Ambulatory Visit: Payer: Self-pay

## 2021-06-24 VITALS — BP 132/80 | HR 60 | Ht 70.0 in | Wt 188.0 lb

## 2021-06-24 DIAGNOSIS — I255 Ischemic cardiomyopathy: Secondary | ICD-10-CM | POA: Diagnosis not present

## 2021-06-24 DIAGNOSIS — I2510 Atherosclerotic heart disease of native coronary artery without angina pectoris: Secondary | ICD-10-CM

## 2021-06-24 DIAGNOSIS — I5022 Chronic systolic (congestive) heart failure: Secondary | ICD-10-CM | POA: Diagnosis not present

## 2021-06-24 DIAGNOSIS — E785 Hyperlipidemia, unspecified: Secondary | ICD-10-CM

## 2021-06-24 DIAGNOSIS — I10 Essential (primary) hypertension: Secondary | ICD-10-CM

## 2021-06-24 MED ORDER — FUROSEMIDE 20 MG PO TABS: 20.0000 mg | ORAL_TABLET | Freq: Every day | ORAL | 3 refills | Status: DC | PRN

## 2021-06-24 MED ORDER — CARVEDILOL 6.25 MG PO TABS: 6.2500 mg | ORAL_TABLET | Freq: Two times a day (BID) | ORAL | 3 refills | Status: DC

## 2021-06-24 MED ORDER — ATORVASTATIN CALCIUM 40 MG PO TABS: 40.0000 mg | ORAL_TABLET | Freq: Every day | ORAL | 3 refills | Status: DC

## 2021-06-24 NOTE — Progress Notes (Signed)
Office Visit    Patient Name: Raymond Rios Date of Encounter: 06/24/2021  Primary Care Provider:  Jaclyn Shaggy, MD Primary Cardiologist:  Yvonne Kendall, MD  Chief Complaint    87 year old male with a history of CAD status post late presenting anterior STEMI in October 2018 with finding of an occluded LAD, ischemic cardiomyopathy/HFrEF, paroxysmal atrial fibrillation, hypertension hyperlipidemia, COPD, and myasthenia gravis, who presents for follow-up of CAD and heart failure.  Past Medical History    Past Medical History:  Diagnosis Date   Allergy    Anemia    Arthritis    CAD (coronary artery disease)    a. late presenting anterior STEMI 05/29/17 Cath: thrombotic occlusion of pLAD, dominant LCx, OM3 50%, lpda 80%, ant & apical AK with EF 35%.     Chronic systolic heart failure (HCC)    Depression    GERD (gastroesophageal reflux disease)    History of chickenpox    History of colon polyps    Hyperlipidemia    Hypertension    Ischemic cardiomyopathy    a. late presenting anterior STEMI 05/29/2017, TTE 05/30/17: EF 30-35%, AK of the mid-apicalanteroseptal, anterior, antlat, & apical wall, Gr1DD; c. 08/2017 Echo: EF 30-35%, mid-apicalanteroseptal, ant, apical AK, Gr1 DD.   Myasthenia gravis (HCC)    PAF (paroxysmal atrial fibrillation) (HCC)    a, newly diagnosed 06/01/2017; b. CHADS2VASC => 5 (CHF, HTN, age x 2, vascular disease)-->No OAC in the setting of ongoing ASA/Plavix, mild dementia, lives alone.  On Amio.   Urine incontinence    Past Surgical History:  Procedure Laterality Date   CARDIAC CATHETERIZATION     CHOLECYSTECTOMY     LEFT HEART CATH AND CORONARY ANGIOGRAPHY N/A 05/29/2017   Procedure: LEFT HEART CATH AND CORONARY ANGIOGRAPHY;  Surgeon: Yvonne Kendall, MD;  Location: [facility] INVASIVE CV LAB;  Service: Cardiovascular;  Laterality: N/A;   TONSILLECTOMY AND ADENOIDECTOMY      Allergies  Allergies  Allergen Reactions   Other Hives   Sulfonamide  Derivatives     REACTION: unspecified    History of Present Illness    87 year old male with above complex past medical history including hypertension, hyperlipidemia, COPD, and myasthenia gravis.  In October 2018, he presented with a late presenting anterior STEMI.  Echo showed an EF of 30-35%.  Catheterization showed a thrombotic occlusion of the LAD and otherwise nonobstructive disease.  Medical therapy was recommended given late presentation and absence of chest pain.  He was noted to have paroxysmal atrial fibrillation during hospitalization and was placed on amiodarone with conversion to sinus rhythm.  Oral anticoagulation was not initiated given dual antiplatelet therapy, history of dementia, and note the patient was living alone.  He had a follow-up echo in January 2019 showing persistent LV dysfunction with an EF of 30 to 35%.  He has previously declined ICD therapy.  He has done reasonably well over the years.  Carvedilol dose was reduced in November 2021 due to orthostatic lightheadedness and bradycardia.  He was last seen in cardiology clinic in May 2022, at which time he was felt to be mildly volume overloaded based on elevated ReDS Vest reading.  He was advised to take Lasix 20 mg x 2 days and then on an as-needed basis.  Since his last visit, he has been relatively stable.  His son is present with him today.  Patient has chronic, stable dyspnea on exertion though he has some increase in dyspnea with activity.  He does try  and walk around his assisted living facility regularly and also will sometimes use an elliptical.  He denies chest pain, palpitations, PND, orthopnea, dizziness, syncope, edema, or early satiety.  Home Medications    Current Outpatient Medications  Medication Sig Dispense Refill   levothyroxine (SYNTHROID, LEVOTHROID) 75 MCG tablet Take 75 mcg by mouth every morning.  1   omeprazole (PRILOSEC) 20 MG capsule Take 20 mg by mouth daily.     PARoxetine (PAXIL) 10 MG  tablet Take 10 mg by mouth daily.     tamsulosin (FLOMAX) 0.4 MG CAPS capsule Take 0.4 mg by mouth daily.     aspirin EC 81 MG tablet Take 1 tablet (81 mg total) by mouth daily. Start once you have finished your plavix. (Patient not taking: Reported on 06/24/2021) 90 tablet 3   atorvastatin (LIPITOR) 40 MG tablet Take 1 tablet (40 mg total) by mouth daily at 6 PM. 90 tablet 3   carvedilol (COREG) 6.25 MG tablet Take 1 tablet (6.25 mg total) by mouth 2 (two) times daily. 180 tablet 3   furosemide (LASIX) 20 MG tablet Take 1 tablet (20 mg total) by mouth daily as needed. 90 tablet 3   No current facility-administered medications for this visit.     Review of Systems    Chronic, stable dyspnea on exertion.  He denies chest pain, palpitations, PND, orthopnea, dizziness, syncope, edema, or early satiety.  All other systems reviewed and are otherwise negative except as noted above.    Physical Exam    VS:  BP (!) 144/80 (BP Location: Left Arm, Patient Position: Sitting, Cuff Size: Normal)   Pulse 60   Ht 5\' 10"  (1.778 m)   Wt 188 lb (85.3 kg)   SpO2 90%   BMI 26.98 kg/m  , BMI Body mass index is 26.98 kg/m.     GEN: Well nourished, well developed, in no acute distress. HEENT: normal. Neck: Supple, no JVD, carotid bruits, or masses. Cardiac: RRR, no murmurs, rubs, or gallops. No clubbing, cyanosis, 2+ bilateral ankle edema.  Radials/PT 2+ and equal bilaterally.  Respiratory:  Respirations regular and unlabored, bibasilar crackles. GI: Soft, nontender, nondistended, BS + x 4. MS: no deformity or atrophy. Skin: warm and dry, no rash. Neuro:  Strength and sensation are intact. Psych: Normal affect.  Accessory Clinical Findings    ECG personally reviewed by me today -regular sinus rhythm, 60, left axis deviation, prior septal infarct, lateral T wave inversions- no acute changes.  Lab Results  Component Value Date   WBC 8.5 06/01/2017   HGB 11.2 (L) 06/01/2017   HCT 33.4 (L)  06/01/2017   MCV 86.1 06/01/2017   PLT 112 (L) 06/01/2017   Lab Results  Component Value Date   CREATININE 0.99 02/11/2018   BUN 21 02/11/2018   NA 140 02/11/2018   K 4.2 02/11/2018   CL 103 02/11/2018   CO2 23 02/11/2018   Lab Results  Component Value Date   ALT 57 05/29/2017   AST 339 (H) 05/29/2017   ALKPHOS 39 05/29/2017   BILITOT 0.9 05/29/2017   Lab Results  Component Value Date   CHOL 89 05/30/2017   HDL 46 05/30/2017   LDLCALC 34 05/30/2017   LDLDIRECT 190.3 08/23/2009   TRIG 44 05/30/2017   CHOLHDL 1.9 05/30/2017     Assessment & Plan    1.  Chronic systolic congestive heart failure/ischemic cardiomyopathy: EF 30 to 35% by echo in January 2019.  Patient notes stable, chronic dyspnea on  exertion and his weight is also stable compared to last visit.  He does have edema surrounding his ankles and also crackles on exam.  He has been using Lasix 20 mg as needed.  I have asked him to take Lasix 20 mg tomorrow and Sunday and to begin weighing daily or at least several times per week, as this has not been taking place at the assisted living facility which he stays.  He does not currently add salt to food though is not entirely control of his diet given he relies on the cafeteria.  He remains on carvedilol therapy.  He did not previously tolerate ACE inhibitor's or ARB's due to orthostasis.  His son notes that he recently had labs with his primary care provider.  Those not available to me today though patient and son report that there were no concerning values noted.  2.  Coronary artery disease: Patient has been doing well without chest pain.  Is chronic, stable dyspnea on exertion.  He remains on aspirin, statin, beta-blocker.  3.  Hyperlipidemia: Lipids followed by primary care patient says he recently had labs.  Those are not available to me today.  He remains on statin therapy.  4.  Essential hypertension: Blood pressure stable on carvedilol therapy.  Developed orthostasis  with ACE/ARB in the past.  Continue current regimen.  5.  Disposition: Encourage patient to have blood pressure and weights checked at facility more frequently.  Offered early follow-up in 4 to 6 weeks however, patient son prefer 6 months.  They will call us in the interim if there are any issues.  Nicolasa Ducking, NP 06/24/2021, 6:56 PM

## 2021-06-24 NOTE — Patient Instructions (Addendum)
Medication Instructions:  Take Lasix tomorrow and again on Sunday. Then go back to taking as needed  *If you need a refill on your cardiac medications before your next appointment, please call your pharmacy*   Lab Work: None  If you have labs (blood work) drawn today and your tests are completely normal, you will receive your results only by: MyChart Message (if you have MyChart) OR A paper copy in the mail If you have any lab test that is abnormal or we need to change your treatment, we will call you to review the results.   Testing/Procedures: None   Follow-Up: At [facility], you and your health needs are our priority.  As part of our continuing mission to provide you with exceptional heart care, we have created designated Provider Care Teams.  These Care Teams include your primary Cardiologist (physician) and Advanced Practice Providers (APPs -  Physician Assistants and Nurse Practitioners) who all work together to provide you with the care you need, when you need it.  We recommend signing up for the patient portal called "MyChart".  Sign up information is provided on this After Visit Summary.  MyChart is used to connect with patients for Virtual Visits (Telemedicine).  Patients are able to view lab/test results, encounter notes, upcoming appointments, etc.  Non-urgent messages can be sent to your provider as well.   To learn more about what you can do with MyChart, go to [email or web address].    Your next appointment:   6 month(s)  The format for your next appointment:   In Person  Provider:   Yvonne Kendall, MD

## 2021-08-18 ENCOUNTER — Ambulatory Visit: Payer: Medicare HMO | Admitting: Podiatry

## 2021-08-18 ENCOUNTER — Encounter: Payer: Self-pay | Admitting: Podiatry

## 2021-08-18 ENCOUNTER — Other Ambulatory Visit: Payer: Self-pay

## 2021-08-18 DIAGNOSIS — M79675 Pain in left toe(s): Secondary | ICD-10-CM | POA: Diagnosis not present

## 2021-08-18 DIAGNOSIS — M79674 Pain in right toe(s): Secondary | ICD-10-CM

## 2021-08-18 DIAGNOSIS — B351 Tinea unguium: Secondary | ICD-10-CM

## 2021-08-18 NOTE — Progress Notes (Signed)

## 2021-12-01 ENCOUNTER — Ambulatory Visit: Payer: Medicare HMO | Admitting: Podiatry

## 2021-12-01 ENCOUNTER — Encounter: Payer: Self-pay | Admitting: Podiatry

## 2021-12-01 DIAGNOSIS — M79674 Pain in right toe(s): Secondary | ICD-10-CM

## 2021-12-01 DIAGNOSIS — B351 Tinea unguium: Secondary | ICD-10-CM

## 2021-12-01 DIAGNOSIS — M79675 Pain in left toe(s): Secondary | ICD-10-CM

## 2021-12-01 NOTE — Progress Notes (Signed)

## 2021-12-26 NOTE — Progress Notes (Unsigned)
Cardiology Office Note    Date:  12/28/2021   ID:  Raymond Rios, DOB [birth year], MRN [id number]  PCP:  Jaclyn Shaggy, MD  Cardiologist:  Yvonne Kendall, MD  Electrophysiologist:  None   Chief Complaint: Follow-up  History of Present Illness:   Raymond Rios is a 89 y.o. male with history of CAD with late presenting anterior STEMI in 05/2017, HFrEF secondary to ICM, PAF, myasthenia gravis, HTN, HLD, dementia, and COPD who presents for follow-up of CAD and cardiomyopathy.  He was admitted in 05/2017 with a late presenting anterior STEMI.  Echo showed an EF of 30 to 35%.  LHC showed a thrombotic occlusion of the LAD and otherwise nonobstructive disease.  Medical therapy was recommended given late presentation and absence of chest pain.  During the admission, he was noted to have A-fib and was placed on amiodarone with conversion to sinus rhythm.  Oral anticoagulation was not initiated given DAPT and history of dementia.  Follow-up echo in 08/2017 showed persistent LV dysfunction with an EF of 30 to 35%.  Note indicates he previously declined ICD therapy.  Carvedilol has previously been reduced secondary to orthostatic lightheadedness and bradycardia.  He was last seen in the office on 06/24/2021 and was relatively stable with chronic dyspnea on exertion.  He was felt to be mildly volume overloaded and was briefly diuresed with Lasix 20 mg.  He comes in accompanied by his son today and is doing well from a cardiac perspective without symptoms of angina or decompensation.  He notes stable mild bilateral ankle edema.  He is taking his furosemide approximately 2 days/week.  He tries to walk on a treadmill on a daily basis, for approximately 3 to 5 minutes.  With this, he does not have symptoms of chest pain, though does get fatigued.  Overall, he feels well and at his baseline.  No significant orthopnea, PND, or early satiety.  No falls or symptoms concerning for bleeding.   Labs independently  reviewed: 02/2021 - TSH normal, BUN 20, serum creatinine 1.0, potassium 4.7, TC 122, TG 85, HDL 43, LDL 62 05/2020 - albumin 4.0, AST/ALT normal  Past Medical History:  Diagnosis Date   ALLERGY 11/08/2007   Qualifier: Diagnosis of  By: Hetty Ely MD, Franne Grip    Anemia    ANXIETY 08/07/1948   Qualifier: Diagnosis of  By: Hetty Ely MD, Franne Grip    Arthritis    Chronic systolic heart failure (HCC)    COPD 08/07/1988   Qualifier: Diagnosis of  By: Hetty Ely MD, Franne Grip    Coronary artery disease involving native coronary artery of native heart without angina pectoris 12/06/2017   Cramp in limb 10/30/2013   Depression    ECZEMA 10/14/2008   Qualifier: Diagnosis of  By: Hetty Ely MD, Franne Grip    Essential hypertension 12/05/2001   Qualifier: Diagnosis of  By: Hetty Ely MD, Franne Grip    GERD 10/14/2008   Qualifier: Diagnosis of  By: Hetty Ely MD, Franne Grip    Hesitancy 11/19/2013   History of chickenpox    History of colon polyps    HTN (hypertension) 10/30/2013   HYPERLIPIDEMIA 08/07/1993   Qualifier: Diagnosis of  By: Hetty Ely MD, Franne Grip    Hyperlipidemia LDL goal <70 10/30/2013   Ischemic cardiomyopathy    a. late presenting anterior STEMI 05/29/2017, TTE 05/30/17: EF 30-35%, AK of the mid-apicalanteroseptal, anterior, antlat, & apical wall, Gr1DD; c. 08/2017 Echo: EF 30-35%, mid-apicalanteroseptal, ant, apical AK, Gr1 DD.  Myasthenia gravis (HCC)    Need for shingles vaccine 02/16/2014   Palpitations 07/02/2020   Paroxysmal atrial fibrillation (HCC) 12/06/2017   Peyronie's disease 11/15/2006   Qualifier: Diagnosis of  By: Hetty Ely MD, Franne Grip    RECTAL BLEEDING, HX OF 11/15/2006   Qualifier: Diagnosis of  By: Hetty Ely MD, Franne Grip    Routine general medical examination at a health care facility 11/19/2013   Screening for prostate cancer 11/19/2013   SHOULDER PAIN, LEFT, CHRONIC 12/05/2006   Qualifier: Diagnosis of  By: Hetty Ely MD, Franne Grip    Tremor 05/16/2018   Urine  incontinence    VITAMIN D DEFICIENCY 04/29/2009   Qualifier: Diagnosis of  By: Hetty Ely MD, Franne Grip     Past Surgical History:  Procedure Laterality Date   CARDIAC CATHETERIZATION     CHOLECYSTECTOMY     LEFT HEART CATH AND CORONARY ANGIOGRAPHY N/A 05/29/2017   Procedure: LEFT HEART CATH AND CORONARY ANGIOGRAPHY;  Surgeon: Yvonne Kendall, MD;  Location: [facility] INVASIVE CV LAB;  Service: Cardiovascular;  Laterality: N/A;   TONSILLECTOMY AND ADENOIDECTOMY      Current Medications: Current Meds  Medication Sig   levothyroxine (SYNTHROID, LEVOTHROID) 75 MCG tablet Take 75 mcg by mouth every morning.   omeprazole (PRILOSEC) 20 MG capsule Take 20 mg by mouth daily.   PARoxetine (PAXIL) 20 MG tablet Take 20 mg by mouth daily.   tamsulosin (FLOMAX) 0.4 MG CAPS capsule Take 0.4 mg by mouth daily.   [DISCONTINUED] atorvastatin (LIPITOR) 40 MG tablet Take 1 tablet (40 mg total) by mouth daily at 6 PM.   [DISCONTINUED] carvedilol (COREG) 6.25 MG tablet Take 6.25 mg by mouth 2 (two) times daily with a meal.   [DISCONTINUED] furosemide (LASIX) 20 MG tablet Take 1 tablet (20 mg total) by mouth daily as needed.    Allergies:   Other and Sulfonamide derivatives   Social History   Socioeconomic History   Marital status: Married    Spouse name: Not on file   Number of children: 3   Years of education: Not on file   Highest education level: Not on file  Occupational History   Occupation: Retired    Associate Professor: RETIRED  Tobacco Use   Smoking status: Former   Smokeless tobacco: Former   Tobacco comments:    quit around [employer]  Vaping Use   Vaping Use: Never used  Substance and Sexual Activity   Alcohol use: No   Drug use: No   Sexual activity: Never  Other Topics Concern   Not on file  Social History Narrative   Not on file   Social Determinants of Health   Financial Resource Strain: Not on file  Food Insecurity: Not on file  Transportation Needs: Not on file  Physical  Activity: Not on file  Stress: Not on file  Social Connections: Not on file     Family History:  The patient's family history includes Arthritis in his father; Hyperlipidemia in his father and mother.  ROS:   12-point review of systems is negative unless otherwise noted in the HPI.   EKGs/Labs/Other Studies Reviewed:    Studies reviewed were summarized above. The additional studies were reviewed today:  Zio patch 12/2017: The patient was enrolled for 30 days. 81% of the monitoring period yielded diagnostic tracings. The predominant rhythm was sinus with an average rate of 61 bpm (range 49-99 bpm). There were 2 episodes of non-sustained ventricular tachycardia lasting 5 beats. Isolated PAC's and PVC's were noted. There  was no sustained arrhythmia or prolonged pause.   Predominantly sinus rhythm with isolated PAC's and PVC's.  Two episodes of non-sustained ventricular tachycardia (5 beats). __________  Limited echo 08/31/2017: - Left ventricle: The cavity size was normal. Wall thickness was    normal. Systolic function was moderately to severely reduced. The    estimated ejection fraction was in the range of 30% to 35%.    Akinesis of the mid-apicalanteroseptal, anterior, and apical    myocardium. Doppler parameters are consistent with abnormal left    ventricular relaxation (grade 1 diastolic dysfunction).   Impressions:   - no significant change since last echo. ___________  2D echo 05/30/2017: - Procedure narrative: Transthoracic echocardiography. The study    was technically difficult.  - Left ventricle: There was mild focal basal hypertrophy of the    septum. Systolic function was moderately to severely reduced. The    estimated ejection fraction was in the range of 30% to 35%. There    is akinesis of the mid-apicalanteroseptal, anterior,    anterolateral, and apical myocardium. Doppler parameters are    consistent with abnormal left ventricular relaxation (grade 1     diastolic dysfunction).  - Aortic valve: Mildly calcified annulus. Trileaflet.  - Left atrium: The atrium was mildly dilated. ___________  [facility] Conclusions: Thrombotic total occlusion of proximal LAD. Dominant LCx with 50% OM3 stenosis and 80% lPDA lesion. Anterior and apical akinesis with LVEF ~35%. Moderately elevated left ventricular filling pressure.   Recommendations: Given onset of symptoms > 24 hours ago, anteroseptal Q-waves on EKG, hemodynamic/electrical stability, and resolution of chest pain, I recommend medical therapy. Dual antiplatelet therapy with aspirin and clopidogrel for up to 12 months, as tolerated. If no evidence of bleeding from cath site, consider starting heparin infusion in 4 hours. Aggressive secondary prevention, including high-intensity statin therapy. Transthoracic echo. Hold home atenolol; start carvedilol as heart rate tolerates. Consider adding ACEI/ARB tomorrow as BP and renal function allow. Consider gentle diuresis, given reduced LVEF and elevated LVEDP.     EKG:  EKG is ordered today.  The EKG ordered today demonstrates NSR, 64 bpm, rare PVC, right axis deviation, possible prior septal infarct, nonspecific inferolateral ST-T changes, reviewed with primary cardiologist  Recent Labs: No results found for requested labs within last 8760 hours.  Recent Lipid Panel    Component Value Date/Time   CHOL 89 05/30/2017 0827   TRIG 44 05/30/2017 0827   HDL 46 05/30/2017 0827   CHOLHDL 1.9 05/30/2017 0827   VLDL 9 05/30/2017 0827   LDLCALC 34 05/30/2017 0827   LDLDIRECT 190.3 08/23/2009 0819    PHYSICAL EXAM:    VS:  BP 140/80   Pulse 64   Ht 5\' 10"  (1.778 m)   Wt 188 lb (85.3 kg)   SpO2 95%   BMI 26.98 kg/m   BMI: Body mass index is 26.98 kg/m.  Physical Exam Vitals reviewed.  Constitutional:      Appearance: He is well-developed.  HENT:     Head: Normocephalic and atraumatic.  Eyes:     General:        Right eye: No  discharge.        Left eye: No discharge.  Cardiovascular:     Rate and Rhythm: Normal rate and regular rhythm.     Pulses:          Posterior tibial pulses are 2+ on the right side and 2+ on the left side.     Heart sounds:  Normal heart sounds, S1 normal and S2 normal. Heart sounds not distant. No midsystolic click and no opening snap. No murmur heard.   No friction rub.  Pulmonary:     Effort: Pulmonary effort is normal. No respiratory distress.     Breath sounds: Normal breath sounds. No decreased breath sounds, wheezing or rales.  Chest:     Chest wall: No tenderness.  Abdominal:     General: There is no distension.     Palpations: Abdomen is soft.  Musculoskeletal:     Cervical back: Normal range of motion.     Comments: Trivial bilateral ankle edema.  Skin:    General: Skin is warm and dry.     Nails: There is no clubbing.  Neurological:     Mental Status: He is alert and oriented to person, place, and time.  Psychiatric:        Speech: Speech normal.        Behavior: Behavior normal.        Thought Content: Thought content normal.        Judgment: Judgment normal.    Wt Readings from Last 3 Encounters:  12/28/21 188 lb (85.3 kg)  06/24/21 188 lb (85.3 kg)  12/29/20 189 lb (85.7 kg)     ASSESSMENT & PLAN:   CAD involving the native coronary arteries without angina: Overall, he is doing well and is without symptoms of angina or decompensation.  He does have new EKG changes in the office today, which were reviewed with his primary cardiologist, though does not have any symptoms concerning for angina or decompensation.  Given this, he would like to defer further ischemic testing at this time.  Should he develop concerning symptoms, I would have a low threshold for recommendation of cardiac cath.  Continue aggressive risk factor modification and secondary prevention including aspirin, atorvastatin, and carvedilol.  HFrEF secondary to ICM: He appears euvolemic and well  compensated with stable mild bilateral ankle edema.  Escalation of GDMT has been limited in the past secondary to orthostasis.  I did discuss possible escalation of GDMT with potential addition of SGLT2 inhibitor.  However, he prefers to defer this at this time.  If able, moving forward, would recommend further escalation of GDMT with SGLT2 inhibitor along with MRA, as vital signs and symptoms allow.  Patient and son report his PCP recently drew labs last month, we will contact their office to have these faxed for our review.  HLD: LDL 62 in 02/2021.  He remains on atorvastatin 40 mg.  Followed by PCP.  HTN: Blood pressure is mildly elevated in the office today, though he does have history of significant orthostasis.  Given this, no medication changes were made.    Disposition: F/u with Dr. Okey Dupre or an APP in 6 months.   Medication Adjustments/Labs and Tests Ordered: Current medicines are reviewed at length with the patient today.  Concerns regarding medicines are outlined above. Medication changes, Labs and Tests ordered today are summarized above and listed in the Patient Instructions accessible in Encounters.   Signed, Eula Listen, PA-C 12/28/2021 12:55 PM     [facility] [place] [address] [place], [place] [address] [phone]

## 2021-12-27 DIAGNOSIS — G7000 Myasthenia gravis without (acute) exacerbation: Secondary | ICD-10-CM | POA: Insufficient documentation

## 2021-12-27 DIAGNOSIS — R32 Unspecified urinary incontinence: Secondary | ICD-10-CM | POA: Insufficient documentation

## 2021-12-27 DIAGNOSIS — D649 Anemia, unspecified: Secondary | ICD-10-CM | POA: Insufficient documentation

## 2021-12-27 DIAGNOSIS — Z8619 Personal history of other infectious and parasitic diseases: Secondary | ICD-10-CM | POA: Insufficient documentation

## 2021-12-27 DIAGNOSIS — Z86010 Personal history of colonic polyps: Secondary | ICD-10-CM | POA: Insufficient documentation

## 2021-12-27 DIAGNOSIS — F32A Depression, unspecified: Secondary | ICD-10-CM | POA: Insufficient documentation

## 2021-12-27 DIAGNOSIS — M1990 Unspecified osteoarthritis, unspecified site: Secondary | ICD-10-CM | POA: Insufficient documentation

## 2021-12-28 ENCOUNTER — Encounter: Payer: Self-pay | Admitting: Physician Assistant

## 2021-12-28 ENCOUNTER — Encounter: Payer: Self-pay | Admitting: *Deleted

## 2021-12-28 ENCOUNTER — Ambulatory Visit: Payer: Medicare HMO | Admitting: Physician Assistant

## 2021-12-28 VITALS — BP 140/80 | HR 64 | Ht 70.0 in | Wt 188.0 lb

## 2021-12-28 DIAGNOSIS — I2510 Atherosclerotic heart disease of native coronary artery without angina pectoris: Secondary | ICD-10-CM

## 2021-12-28 DIAGNOSIS — I255 Ischemic cardiomyopathy: Secondary | ICD-10-CM

## 2021-12-28 DIAGNOSIS — I5022 Chronic systolic (congestive) heart failure: Secondary | ICD-10-CM | POA: Diagnosis not present

## 2021-12-28 DIAGNOSIS — E785 Hyperlipidemia, unspecified: Secondary | ICD-10-CM

## 2021-12-28 DIAGNOSIS — I10 Essential (primary) hypertension: Secondary | ICD-10-CM

## 2021-12-28 MED ORDER — CARVEDILOL 6.25 MG PO TABS: 6.2500 mg | ORAL_TABLET | Freq: Two times a day (BID) | ORAL | 3 refills | Status: DC

## 2021-12-28 MED ORDER — ATORVASTATIN CALCIUM 40 MG PO TABS: 40.0000 mg | ORAL_TABLET | Freq: Every day | ORAL | 3 refills | Status: DC

## 2021-12-28 MED ORDER — FUROSEMIDE 20 MG PO TABS: 20.0000 mg | ORAL_TABLET | Freq: Every day | ORAL | 3 refills | Status: DC | PRN

## 2021-12-28 NOTE — Patient Instructions (Signed)
Medication Instructions:  No changes at this time.   *If you need a refill on your cardiac medications before your next appointment, please call your pharmacy*   Lab Work: None  If you have labs (blood work) drawn today and your tests are completely normal, you will receive your results only by: MyChart Message (if you have MyChart) OR A paper copy in the mail If you have any lab test that is abnormal or we need to change your treatment, we will call you to review the results.   Testing/Procedures: None   Follow-Up: At [facility], you and your health needs are our priority.  As part of our continuing mission to provide you with exceptional heart care, we have created designated Provider Care Teams.  These Care Teams include your primary Cardiologist (physician) and Advanced Practice Providers (APPs -  Physician Assistants and Nurse Practitioners) who all work together to provide you with the care you need, when you need it.  We recommend signing up for the patient portal called "MyChart".  Sign up information is provided on this After Visit Summary.  MyChart is used to connect with patients for Virtual Visits (Telemedicine).  Patients are able to view lab/test results, encounter notes, upcoming appointments, etc.  Non-urgent messages can be sent to your provider as well.   To learn more about what you can do with MyChart, go to [email or web address].    Your next appointment:   6 month(s)  The format for your next appointment:   In Person  Provider:   Christopher End, MD or Ryan Dunn, PA-C       Important Information About Sugar       

## 2022-03-09 ENCOUNTER — Ambulatory Visit: Payer: Medicare HMO | Admitting: Podiatry

## 2022-03-09 ENCOUNTER — Encounter: Payer: Self-pay | Admitting: Podiatry

## 2022-03-09 DIAGNOSIS — M79674 Pain in right toe(s): Secondary | ICD-10-CM

## 2022-03-09 DIAGNOSIS — B351 Tinea unguium: Secondary | ICD-10-CM | POA: Diagnosis not present

## 2022-03-09 DIAGNOSIS — M79675 Pain in left toe(s): Secondary | ICD-10-CM

## 2022-03-09 NOTE — Progress Notes (Signed)

## 2022-06-15 ENCOUNTER — Encounter: Payer: Self-pay | Admitting: Podiatry

## 2022-06-15 ENCOUNTER — Ambulatory Visit: Payer: Medicare HMO | Admitting: Podiatry

## 2022-06-15 DIAGNOSIS — M79675 Pain in left toe(s): Secondary | ICD-10-CM | POA: Diagnosis not present

## 2022-06-15 DIAGNOSIS — M79674 Pain in right toe(s): Secondary | ICD-10-CM

## 2022-06-15 DIAGNOSIS — B351 Tinea unguium: Secondary | ICD-10-CM | POA: Diagnosis not present

## 2022-06-15 NOTE — Progress Notes (Signed)

## 2022-06-17 ENCOUNTER — Emergency Department (HOSPITAL_COMMUNITY): Payer: Medicare HMO

## 2022-06-17 ENCOUNTER — Emergency Department (HOSPITAL_COMMUNITY)
Admission: EM | Admit: 2022-06-17 | Discharge: 2022-06-17 | Disposition: A | Discharge status: Skilled Nursing Facility | Payer: Medicare HMO | Attending: Emergency Medicine | Admitting: Emergency Medicine

## 2022-06-17 ENCOUNTER — Encounter (HOSPITAL_COMMUNITY): Payer: Self-pay

## 2022-06-17 ENCOUNTER — Other Ambulatory Visit: Payer: Self-pay

## 2022-06-17 DIAGNOSIS — Z7982 Long term (current) use of aspirin: Secondary | ICD-10-CM | POA: Diagnosis not present

## 2022-06-17 DIAGNOSIS — Z23 Encounter for immunization: Secondary | ICD-10-CM | POA: Insufficient documentation

## 2022-06-17 DIAGNOSIS — I509 Heart failure, unspecified: Secondary | ICD-10-CM | POA: Insufficient documentation

## 2022-06-17 DIAGNOSIS — I2510 Atherosclerotic heart disease of native coronary artery without angina pectoris: Secondary | ICD-10-CM | POA: Diagnosis not present

## 2022-06-17 DIAGNOSIS — J449 Chronic obstructive pulmonary disease, unspecified: Secondary | ICD-10-CM | POA: Diagnosis not present

## 2022-06-17 DIAGNOSIS — W19XXXA Unspecified fall, initial encounter: Secondary | ICD-10-CM | POA: Insufficient documentation

## 2022-06-17 DIAGNOSIS — Z7902 Long term (current) use of antithrombotics/antiplatelets: Secondary | ICD-10-CM | POA: Diagnosis not present

## 2022-06-17 DIAGNOSIS — I110 Hypertensive heart disease with heart failure: Secondary | ICD-10-CM | POA: Diagnosis not present

## 2022-06-17 DIAGNOSIS — R55 Syncope and collapse: Secondary | ICD-10-CM | POA: Diagnosis not present

## 2022-06-17 DIAGNOSIS — Z79899 Other long term (current) drug therapy: Secondary | ICD-10-CM | POA: Diagnosis not present

## 2022-06-17 DIAGNOSIS — S01312A Laceration without foreign body of left ear, initial encounter: Secondary | ICD-10-CM | POA: Insufficient documentation

## 2022-06-17 DIAGNOSIS — S0993XA Unspecified injury of face, initial encounter: Secondary | ICD-10-CM | POA: Diagnosis present

## 2022-06-17 LAB — TSH: TSH: 8.29 u[IU]/mL — ABNORMAL HIGH (ref 0.350–4.500)

## 2022-06-17 LAB — CBC WITH DIFFERENTIAL/PLATELET
Abs Immature Granulocytes: 0.06 10*3/uL (ref 0.00–0.07)
Basophils Absolute: 0.1 10*3/uL (ref 0.0–0.1)
Basophils Relative: 1 %
Eosinophils Absolute: 0.2 10*3/uL (ref 0.0–0.5)
Eosinophils Relative: 2 %
HCT: 34.4 % — ABNORMAL LOW (ref 39.0–52.0)
Hemoglobin: 10.5 g/dL — ABNORMAL LOW (ref 13.0–17.0)
Immature Granulocytes: 1 %
Lymphocytes Relative: 10 %
Lymphs Abs: 1.1 10*3/uL (ref 0.7–4.0)
MCH: 27.9 pg (ref 26.0–34.0)
MCHC: 30.5 g/dL (ref 30.0–36.0)
MCV: 91.5 fL (ref 80.0–100.0)
Monocytes Absolute: 1 10*3/uL (ref 0.1–1.0)
Monocytes Relative: 9 %
Neutro Abs: 8.1 10*3/uL — ABNORMAL HIGH (ref 1.7–7.7)
Neutrophils Relative %: 77 %
Platelets: 144 10*3/uL — ABNORMAL LOW (ref 150–400)
RBC: 3.76 MIL/uL — ABNORMAL LOW (ref 4.22–5.81)
RDW: 13.3 % (ref 11.5–15.5)
WBC: 10.5 10*3/uL (ref 4.0–10.5)
nRBC: 0 % (ref 0.0–0.2)

## 2022-06-17 LAB — COMPREHENSIVE METABOLIC PANEL
ALT: 23 U/L (ref 0–44)
AST: 25 U/L (ref 15–41)
Albumin: 3.6 g/dL (ref 3.5–5.0)
Alkaline Phosphatase: 56 U/L (ref 38–126)
Anion gap: 10 (ref 5–15)
BUN: 25 mg/dL — ABNORMAL HIGH (ref 8–23)
CO2: 24 mmol/L (ref 22–32)
Calcium: 8.7 mg/dL — ABNORMAL LOW (ref 8.9–10.3)
Chloride: 107 mmol/L (ref 98–111)
Creatinine, Ser: 1.09 mg/dL (ref 0.61–1.24)
GFR, Estimated: >60 mL/min (ref >60)
Glucose, Bld: 114 mg/dL — ABNORMAL HIGH (ref 70–99)
Potassium: 4.7 mmol/L (ref 3.5–5.1)
Sodium: 141 mmol/L (ref 135–145)
Total Bilirubin: 0.6 mg/dL (ref 0.3–1.2)
Total Protein: 7.1 g/dL (ref 6.5–8.1)

## 2022-06-17 LAB — URINALYSIS, ROUTINE W REFLEX MICROSCOPIC
Bilirubin Urine: NEGATIVE
Glucose, UA: NEGATIVE mg/dL
Hgb urine dipstick: NEGATIVE
Ketones, ur: NEGATIVE mg/dL
Leukocytes,Ua: NEGATIVE
Nitrite: NEGATIVE
Protein, ur: NEGATIVE mg/dL
Specific Gravity, Urine: 1.017 (ref 1.005–1.030)
pH: 6 (ref 5.0–8.0)

## 2022-06-17 LAB — MAGNESIUM: Magnesium: 2 mg/dL (ref 1.7–2.4)

## 2022-06-17 LAB — TROPONIN I (HIGH SENSITIVITY): Troponin I (High Sensitivity): 11 ng/L (ref <18)

## 2022-06-17 LAB — BRAIN NATRIURETIC PEPTIDE: B Natriuretic Peptide: 171.1 pg/mL — ABNORMAL HIGH (ref 0.0–100.0)

## 2022-06-17 MED ORDER — OFLOXACIN 0.3 % OT SOLN: 5.0000 [drp] | Freq: Two times a day (BID) | OTIC | 0 refills | Status: AC

## 2022-06-17 MED ORDER — CEPHALEXIN 500 MG PO CAPS: 500.0000 mg | ORAL_CAPSULE | Freq: Two times a day (BID) | ORAL | 0 refills | Status: AC

## 2022-06-17 MED ORDER — LIDOCAINE HCL 2 % IJ SOLN
10.0000 mL | Freq: Once | INTRAMUSCULAR | Status: AC
  Administered 2022-06-17: 200 mg
  Filled 2022-06-17: qty 20

## 2022-06-17 MED ORDER — TETANUS-DIPHTH-ACELL PERTUSSIS 5-2.5-18.5 LF-MCG/0.5 IM SUSY
0.5000 mL | PREFILLED_SYRINGE | Freq: Once | INTRAMUSCULAR | Status: AC
  Administered 2022-06-17: 0.5 mL via INTRAMUSCULAR
  Filled 2022-06-17: qty 0.5

## 2022-06-17 NOTE — ED Triage Notes (Signed)
BIB [place] [facility] from [facility] for fall at 0230 this AM hitting left side of head, bleeding from left ear, pt reports dizziness and falling, on Plavix

## 2022-06-17 NOTE — ED Notes (Signed)
Dr. Rush Landmark contacted about downgrading pt from level 2.

## 2022-06-17 NOTE — Progress Notes (Signed)
   06/17/22 0909  Clinical Encounter Type  Visited With Patient;Health care provider  Visit Type Initial;ED;Trauma   Chaplain responded to a trauma in the ED - level II, fall on blood thinners. Patient is from an Assisted Living facility, and no family is present at this time. No needs at this time. Spiritual care services available as needed.   Alda Ponder, Chaplain 06/17/22

## 2022-06-17 NOTE — Discharge Instructions (Signed)
Your history, exam, work-up today led [place] to suspect that your fall was related to near syncope from standing up quickly overnight.  The CT imaging of her head and neck did not show evidence of significant acute traumatic injuries with no skull fracture or neck fracture.  No bleeding inside your head.  You did have the 2 lacerations on her left ear which we repaired at the direction of the on-call facial trauma team with Dr. Kenney Houseman.  We used absorbable sutures at his recommendation and he says that you can follow-up with either your PCP or him in clinic if needed.  Please keep it clean and the sutures will absorb.  Please use the drops to prevent infection inside the ear and oral antibiotics to help prevent infection on the outside of the ear.  If any symptoms change or worsen acutely, please return to the nearest emergency department.  Please rest and stay hydrated.

## 2022-06-17 NOTE — ED Provider Notes (Signed)
[facility] EMERGENCY DEPARTMENT Provider Note   CSN: [id number] Arrival date & time: 06/17/22  0841     History  Chief Complaint  Patient presents with   Raymond Rios is a 88 y.o. male.  The history is provided by the patient and medical records. No language interpreter was used.  Fall This is a new problem. The current episode started 6 to 12 hours ago. The problem occurs rarely. The problem has been resolved. Pertinent negatives include no chest pain, no abdominal pain, no headaches and no shortness of breath. Nothing aggravates the symptoms. Nothing relieves the symptoms. He has tried nothing for the symptoms. The treatment provided no relief.       Home Medications Prior to Admission medications   Medication Sig Start Date End Date Taking? Authorizing Provider  aspirin EC 81 MG tablet Take 1 tablet (81 mg total) by mouth daily. Start once you have finished your plavix. Patient not taking: Reported on 12/28/2021 05/16/18   End, Cristal Deer, MD  atorvastatin (LIPITOR) 40 MG tablet Take 1 tablet (40 mg total) by mouth daily at 6 PM. 12/28/21   Dunn, Raymon Mutton, PA-C  carvedilol (COREG) 6.25 MG tablet Take 1 tablet (6.25 mg total) by mouth 2 (two) times daily with a meal. 12/28/21   Dunn, Raymon Mutton, PA-C  furosemide (LASIX) 20 MG tablet Take 1 tablet (20 mg total) by mouth daily as needed. 12/28/21   Dunn, Raymon Mutton, PA-C  levothyroxine (SYNTHROID, LEVOTHROID) 75 MCG tablet Take 75 mcg by mouth every morning. 03/23/17   [provider]  omeprazole (PRILOSEC) 20 MG capsule Take 20 mg by mouth daily.    [provider]  PARoxetine (PAXIL) 20 MG tablet Take 20 mg by mouth daily. 11/16/21   [provider]  tamsulosin (FLOMAX) 0.4 MG CAPS capsule Take 0.4 mg by mouth daily.    [provider]      Allergies    Other and Sulfonamide derivatives    Review of Systems   Review of Systems  Constitutional:  Negative for appetite change,  chills, diaphoresis, fatigue and fever.  HENT:  Negative for congestion.   Eyes:  Negative for visual disturbance.  Respiratory:  Negative for cough, choking, chest tightness, shortness of breath and wheezing.   Cardiovascular:  Negative for chest pain, palpitations and leg swelling.  Gastrointestinal:  Negative for abdominal pain, constipation, diarrhea, nausea and vomiting.  Genitourinary:  Negative for dysuria.  Musculoskeletal:  Negative for back pain, neck pain and neck stiffness.  Skin:  Positive for wound. Negative for rash.  Neurological:  Negative for syncope (near syncope), light-headedness, numbness and headaches.  Psychiatric/Behavioral:  Negative for agitation and confusion.   All other systems reviewed and are negative.   Physical Exam Updated Vital Signs BP (!) 140/66   Pulse 65   Temp 98.1 F (36.7 C) (Oral)   Resp (!) 21   Ht 5\' 10"  (1.778 m)   Wt 86.2 kg   SpO2 95%   BMI 27.26 kg/m  Physical Exam Vitals and nursing note reviewed.  Constitutional:      General: He is not in acute distress.    Appearance: He is well-developed. He is not ill-appearing or diaphoretic.  HENT:     Head:      Right Ear: Laceration and tenderness present. No hemotympanum. Tympanic membrane is not perforated, erythematous or bulging.     Nose: Nose normal. No congestion or rhinorrhea.  Mouth/Throat:     Mouth: Mucous membranes are moist.     Pharynx: No oropharyngeal exudate or posterior oropharyngeal erythema.  Eyes:     Extraocular Movements: Extraocular movements intact.     Conjunctiva/sclera: Conjunctivae normal.     Pupils: Pupils are equal, round, and reactive to light.  Cardiovascular:     Rate and Rhythm: Normal rate and regular rhythm.     Heart sounds: No murmur heard. Pulmonary:     Effort: Pulmonary effort is normal. No respiratory distress.     Breath sounds: Normal breath sounds. No wheezing or rhonchi.  Chest:     Chest wall: No tenderness.  Abdominal:      General: Abdomen is flat.     Palpations: Abdomen is soft.     Tenderness: There is no abdominal tenderness. There is no right CVA tenderness, left CVA tenderness, guarding or rebound.  Musculoskeletal:        General: Tenderness present. No swelling.     Cervical back: Neck supple.     Right lower leg: No edema.     Left lower leg: No edema.  Skin:    General: Skin is warm and dry.     Capillary Refill: Capillary refill takes less than 2 seconds.  Neurological:     Mental Status: He is alert.     Sensory: No sensory deficit.     Motor: No weakness.  Psychiatric:        Mood and Affect: Mood normal.     ED Results / Procedures / Treatments   Labs (all labs ordered are listed, but only abnormal results are displayed) Labs Reviewed  COMPREHENSIVE METABOLIC PANEL - Abnormal; Notable for the following components:      Result Value   Glucose, Bld 114 (*)    BUN 25 (*)    Calcium 8.7 (*)    All other components within normal limits  TSH - Abnormal; Notable for the following components:   TSH 8.290 (*)    All other components within normal limits  BRAIN NATRIURETIC PEPTIDE - Abnormal; Notable for the following components:   B Natriuretic Peptide 171.1 (*)    All other components within normal limits  CBC WITH DIFFERENTIAL/PLATELET - Abnormal; Notable for the following components:   RBC 3.76 (*)    Hemoglobin 10.5 (*)    HCT 34.4 (*)    Platelets 144 (*)    Neutro Abs 8.1 (*)    All other components within normal limits  URINE CULTURE  MAGNESIUM  URINALYSIS, ROUTINE W REFLEX MICROSCOPIC  CBC WITH DIFFERENTIAL/PLATELET  TROPONIN I (HIGH SENSITIVITY)  TROPONIN I (HIGH SENSITIVITY)    EKG EKG Interpretation  Date/Time:  Saturday June 17 2022 08:58:05 EST Ventricular Rate:  62 PR Interval:  165 QRS Duration: 98 QT Interval:  416 QTC Calculation: 423 R Axis:   0 Text Interpretation: Sinus rhythm Ventricular premature complex Anteroseptal infarct, old Abnormal T,  consider ischemia, lateral leads when compared to prior, ST abnormalities have resolved. No STEMI Confirmed by Theda Belfast ([id number]) on 06/17/2022 9:02:16 AM  Radiology CT Head Wo Contrast  Result Date: 06/17/2022 CLINICAL DATA:  Status post fall.  Hit left side of head. EXAM: CT HEAD WITHOUT CONTRAST CT CERVICAL SPINE WITHOUT CONTRAST TECHNIQUE: Multidetector CT imaging of the head and cervical spine was performed following the standard protocol without intravenous contrast. Multiplanar CT image reconstructions of the cervical spine were also generated. RADIATION DOSE REDUCTION: This exam was performed according to the departmental  dose-optimization program which includes automated exposure control, adjustment of the mA and/or kV according to patient size and/or use of iterative reconstruction technique. COMPARISON:  Brain MRI 04/25/2013 FINDINGS: CT HEAD FINDINGS Brain: No evidence of acute infarction, hemorrhage, hydrocephalus, extra-axial collection or mass lesion/mass effect. Remote infarct identified within the right cerebellar hemisphere, image 28/3. Encephalomalacia within the left frontoparietal convexity is also noted compatible with old infarct, image 10/3. Chronic bilateral basal ganglia lacunar infarcts. There is mild diffuse low-attenuation within the subcortical and periventricular white matter compatible with chronic microvascular disease. Prominence of the sulci and ventricles compatible with brain atrophy. Vascular: No hyperdense vessel or unexpected calcification. Skull: Normal. Negative for fracture or focal lesion. Sinuses/Orbits: Mild mucosal thickening is noted involving the maxillary sinuses and ethmoid air cells. Mastoid air cells are clear. Other: None. CT CERVICAL SPINE FINDINGS Alignment: Normal. Skull base and vertebrae: No acute fracture. No primary bone lesion or focal pathologic process. Soft tissues and spinal canal: No prevertebral fluid or swelling. No visible canal  hematoma. Disc levels: Multilevel disc space narrowing and endplate spurring is noted. This is most severe at 2 C5-6 and C6-7. Upper chest: Negative. Other: None IMPRESSION: 1. No acute intracranial abnormality. 2. Chronic small vessel ischemic disease and brain atrophy. 3. Remote infarcts within the right cerebellar hemisphere and left frontoparietal convexity. 4. No evidence for cervical spine fracture or subluxation. 5. Cervical degenerative disc disease. Electronically Signed   By: Signa Kellaylor  Stroud M.D.   On: 06/17/2022 09:55   CT Cervical Spine Wo Contrast  Result Date: 06/17/2022 CLINICAL DATA:  Status post fall.  Hit left side of head. EXAM: CT HEAD WITHOUT CONTRAST CT CERVICAL SPINE WITHOUT CONTRAST TECHNIQUE: Multidetector CT imaging of the head and cervical spine was performed following the standard protocol without intravenous contrast. Multiplanar CT image reconstructions of the cervical spine were also generated. RADIATION DOSE REDUCTION: This exam was performed according to the departmental dose-optimization program which includes automated exposure control, adjustment of the mA and/or kV according to patient size and/or use of iterative reconstruction technique. COMPARISON:  Brain MRI 04/25/2013 FINDINGS: CT HEAD FINDINGS Brain: No evidence of acute infarction, hemorrhage, hydrocephalus, extra-axial collection or mass lesion/mass effect. Remote infarct identified within the right cerebellar hemisphere, image 28/3. Encephalomalacia within the left frontoparietal convexity is also noted compatible with old infarct, image 10/3. Chronic bilateral basal ganglia lacunar infarcts. There is mild diffuse low-attenuation within the subcortical and periventricular white matter compatible with chronic microvascular disease. Prominence of the sulci and ventricles compatible with brain atrophy. Vascular: No hyperdense vessel or unexpected calcification. Skull: Normal. Negative for fracture or focal lesion.  Sinuses/Orbits: Mild mucosal thickening is noted involving the maxillary sinuses and ethmoid air cells. Mastoid air cells are clear. Other: None. CT CERVICAL SPINE FINDINGS Alignment: Normal. Skull base and vertebrae: No acute fracture. No primary bone lesion or focal pathologic process. Soft tissues and spinal canal: No prevertebral fluid or swelling. No visible canal hematoma. Disc levels: Multilevel disc space narrowing and endplate spurring is noted. This is most severe at 2 C5-6 and C6-7. Upper chest: Negative. Other: None IMPRESSION: 1. No acute intracranial abnormality. 2. Chronic small vessel ischemic disease and brain atrophy. 3. Remote infarcts within the right cerebellar hemisphere and left frontoparietal convexity. 4. No evidence for cervical spine fracture or subluxation. 5. Cervical degenerative disc disease. Electronically Signed   By: Signa Kellaylor  Stroud M.D.   On: 06/17/2022 09:55   DG Chest Portable 1 View  Result Date: 06/17/2022 CLINICAL DATA:  88 year old male with syncope. EXAM: PORTABLE CHEST 1 VIEW COMPARISON:  Chest radiographs 11/25/2014 and earlier. FINDINGS: Portable AP semi upright view at 0906 hours. Low lung volumes and lordotic positioning. Possible cardiomegaly now. Chronic Calcified aortic atherosclerosis. Increased tortuosity of the thoracic aorta suspected since 2016. Allowing for portable technique the lungs are clear. No acute osseous abnormality identified. Stable cholecystectomy clips. Paucity of bowel gas in the upper abdomen. IMPRESSION: 1. Low lung volumes with suspicion of increased cardiomegaly and tortuosity of the thoracic aorta since 2016. 2. No acute cardiopulmonary abnormality. Electronically Signed   By: Odessa Fleming M.D.   On: 06/17/2022 09:35    Procedures .Marland KitchenLaceration Repair  Date/Time: 06/17/2022 3:58 PM  Performed by: Heide Scales, MD Authorized by: Heide Scales, MD   Consent:    Consent obtained:  Verbal   Consent given by:  Patient    Risks, benefits, and alternatives were discussed: yes     Risks discussed:  Infection and pain   Alternatives discussed:  No treatment Laceration details:    Location:  Ear   Ear location:  L ear   Length (cm):  1   Depth (mm):  3 Pre-procedure details:    Preparation:  Patient was prepped and draped in usual sterile fashion and imaging obtained to evaluate for foreign bodies Treatment:    Area cleansed with:  Chlorhexidine and saline   Amount of cleaning:  Standard   Irrigation solution:  Sterile saline   Irrigation method:  Syringe   Debridement:  None Skin repair:    Repair method:  Sutures   Suture size:  5-0   Wound skin closure material used: vicryl rapide.   Number of sutures:  7 Approximation:    Approximation:  Close Repair type:    Repair type:  Intermediate Post-procedure details:    Dressing:  Antibiotic ointment   Procedure completion:  Tolerated     Medications Ordered in ED Medications  Tdap (BOOSTRIX) injection 0.5 mL (0.5 mLs Intramuscular Given 06/17/22 0931)  lidocaine (XYLOCAINE) 2 % (with pres) injection 200 mg (200 mg Infiltration Given 06/17/22 1151)    ED Course/ Medical Decision Making/ A&P                           Medical Decision Making Amount and/or Complexity of Data Reviewed Labs: ordered. Radiology: ordered.  Risk Prescription drug management.    Raymond Rios is a 88 y.o. male with a past medical history significant for hypertension, hyperlipidemia, COPD, CHF, CAD, GERD, paroxysmal atrial fibrillation, and myasthenia gravis currently on Plavix therapy who presents as a level 2 trauma for fall with head injury.  According to patient, he stood up to go to the restroom at 230 and this morning and then got lightheaded and went to the ground.  He did not lose consciousness but did fall and hit his left head on the ground.  He said that he had no chest pain, palpitations, or shortness of breath preceding the fall and just felt that he was  getting very lightheaded all of a sudden.  He denies any current symptoms from the neck down with no chest pain, abdominal pain, nausea, vomiting, constipation, diarrhea, or urinary changes.  He continues to have some oozing blood from his left ear as he is on the antiplatelet medication at home.  He denies any significant headache, neck pain or neck stiffness.  He otherwise feels well.  He said he has  felt well the last few days.  On exam, lungs clear and chest nontender.  Abdomen nontender.  Back nontender.  Neck nontender.  Patient has 2 lacerations to the left ear on the lobule and in the intratracheal notch area.  His TM was unremarkable with no hemotympanum or deeper laceration seen in the ear canal.  Rest of the exam was unremarkable with no focal neurologic deficits.  Pupils are symmetric and reactive with normal extract movements.  Clear speech.  Patient will have CT imaging of the head and neck and then will also get some screening labs and chest x-ray to look for other etiologies of near syncope aside from the suspected orthostatic near syncope from standing up quickly to go to the bathroom.   10:31 AM Work-up continues to return.  CT head and neck showed no significant acute traumatic intracranial or cervical neck injuries.  Patient's dressing was of undone and his ear was further evaluated.  He does indeed have 2 lacerations to the left ear, the first being a laceration to the intratracheal notch between the tragus and antitragus that goes externally into the ear canal.  That continues to bleed as it appears to be a deep laceration with some exposed cartilage tissue.  There is also a second smaller laceration to the lobule.  Due to his Plavix use they continue to bleed.  Rest of his work-up is returning reassuring thus far.  His urinalysis does not show UTI and his CBC shows mild anemia.  No leukocytosis.  TSH slightly elevated, will have him follow-up with PCP for this.  Initial opponent is  negative, will trend.  Magnesium normal.  Metabolic panel shows slightly decreased calcium but otherwise reassuring.  Chest x-ray did not show pneumonia but did show some increased tortuosity and cardiomegaly.  Clinically I suspect patient stood up to go to the bathroom and then got lightheaded had an orthostatic near syncopal episode leading to his fall.  As he did not lose consciousness and is otherwise feeling well, anticipate he may be candidate for discharge home after work-up is finalized.  We will call ENT due to the location of his injury.  11:29 AM Spoke to ENT and spoke to Dr. Kenney Houseman who reviewed the case.  He agreed with minimal external repair with some absorbable sutures and using antibiotic drops.  He did not feel patient needs more extensive deep wound management.  We will repair the ear laceration shortly and then reassess patient.  Anticipate discharge home for outpatient follow-up after.  Laceration repaired without difficulty as per instructions by ENT.  Patient will follow-up with PCP and will also give information for ENT follow-up if needed.  Patient given prescription for both oral and drop antibiotics and patient will follow-up.  Patient discharged in good condition after repair and demonstrating stability for several hours.        Final Clinical Impression(s) / ED Diagnoses Final diagnoses:  Fall, initial encounter  Near syncope  Laceration of left earlobe, initial encounter    Rx / DC Orders ED Discharge Orders          Ordered    ofloxacin (FLOXIN) 0.3 % OTIC solution  2 times daily        06/17/22 1259    cephALEXin (KEFLEX) 500 MG capsule  2 times daily        06/17/22 1259             Clinical Impression: 1. Fall, initial encounter   2. Near  syncope   3. Laceration of left earlobe, initial encounter     Disposition: Discharge  Condition: Good  I have discussed the results, Dx and Tx plan with the pt(& family if present). He/she/they  expressed understanding and agree(s) with the plan. Discharge instructions discussed at great length. Strict return precautions discussed and pt &/or family have verbalized understanding of the instructions. No further questions at time of discharge.    New Prescriptions   CEPHALEXIN (KEFLEX) 500 MG CAPSULE    Take 1 capsule (500 mg total) by mouth 2 (two) times daily for 10 days.   OFLOXACIN (FLOXIN) 0.3 % OTIC SOLUTION    Place 5 drops into the left ear 2 (two) times daily for 10 days.    Follow Up: Vivia Ewing, DMD [address] [place] [address] [phone]     [facility] EMERGENCY DEPARTMENT [address] [id number] mc [place] [address] [phone]    Jaclyn Shaggy, MD [address]   [place] [address] [phone]        Zamorah Ailes, Canary Brim, MD 06/17/22 [phone]

## 2022-06-17 NOTE — ED Notes (Signed)
Returned from CT.

## 2022-06-17 NOTE — ED Notes (Signed)
This EMT assisted the patient to the restroom and then back to the patient's bed. The patient bed was secured and in the lowest position.

## 2022-06-17 NOTE — ED Notes (Signed)
MD at bedside for suture repair

## 2022-06-17 NOTE — ED Notes (Signed)
Purple top redrawn and sent.

## 2022-06-17 NOTE — Progress Notes (Signed)
Orthopedic Tech Progress Note Patient Details:  Raymond Rios 01-15-1931 [id number]  Level 2 trauma  Patient ID: Raymond Rios, male   DOB: 06/16/1931, 89 y.o.   MRN: [id number]  Docia Furl 06/17/2022, 9:17 AM

## 2022-06-17 NOTE — ED Notes (Signed)
Pt transported to [facility] via [place]. VSS and pt in NAD at time of departure

## 2022-06-17 NOTE — ED Notes (Signed)
To Ct with RN.

## 2022-06-18 LAB — URINE CULTURE

## 2022-06-27 ENCOUNTER — Encounter: Payer: Self-pay | Admitting: *Deleted

## 2022-07-06 ENCOUNTER — Ambulatory Visit: Payer: Medicare HMO | Attending: Internal Medicine | Admitting: Internal Medicine

## 2022-07-06 ENCOUNTER — Encounter: Payer: Self-pay | Admitting: Internal Medicine

## 2022-07-06 VITALS — BP 126/73 | HR 63 | Ht 69.0 in | Wt 188.0 lb

## 2022-07-06 DIAGNOSIS — I951 Orthostatic hypotension: Secondary | ICD-10-CM

## 2022-07-06 DIAGNOSIS — I2510 Atherosclerotic heart disease of native coronary artery without angina pectoris: Secondary | ICD-10-CM | POA: Diagnosis not present

## 2022-07-06 DIAGNOSIS — R55 Syncope and collapse: Secondary | ICD-10-CM

## 2022-07-06 DIAGNOSIS — I5022 Chronic systolic (congestive) heart failure: Secondary | ICD-10-CM | POA: Diagnosis not present

## 2022-07-06 MED ORDER — CARVEDILOL 3.125 MG PO TABS: 3.1250 mg | ORAL_TABLET | Freq: Two times a day (BID) | ORAL | 3 refills | Status: DC

## 2022-07-06 NOTE — Progress Notes (Unsigned)
Follow-up Outpatient Visit Date: 07/06/2022  Primary Care Provider: Jaclyn Shaggy, MD [address]   [place] [address]  Chief Complaint: Recent fall  HPI:  Raymond Rios is a 88 y.o. male with history of CAD with late presenting anterior STEMI managed medically (05/2017), chronic HFrEF due to ischemic cardiomyopathy, paroxysmal atrial fibrillation, HTN, HLD, COPD, and myasthenia gravis, who presents for follow-up of coronary artery disease and HFrEF.  He was last seen in our office by Eula Listen, PA, in May.  He was doing well at that time with only mild ankle edema that had been stable.  He noted some fatigue when walking on his treadmill but did not have any chest pain or significant dyspnea.  He presented to the ED earlier this month after falling.  He had gotten up to use the bathroom overnight and felt lightheaded.  He believes he may have blacked out for a second or two.  Oozing blood from the left ear was noted.  CT head and neck did not show any traumatic injuries.  External ear laceration was sutured in the ED.  Today, Raymond Rios reports that he is feeling a bit better though he still has some occasional orthostatic lightheadedness.  He has not passed out or fallen again.  He denies chest pain, palpitations, or edema, though he is still taking furosemide twice a week.  His furosemide use is based primarily on when his daughter fills his medication box and not development of edema or weight changes.  His only other complaint is of some left-sided flank pain that began when he fell.  It is only present when he moves in certain ways.  He has not had any hematuria.  --------------------------------------------------------------------------------------------------  Past Medical History:  Diagnosis Date   ALLERGY 11/08/2007   Qualifier: Diagnosis of  By: Hetty Ely MD, Franne Grip    Anemia    ANXIETY 08/07/1946   Qualifier: Diagnosis of  By: Hetty Ely MD, Franne Grip    Arthritis     Chronic systolic heart failure (HCC)    COPD 08/07/1988   Qualifier: Diagnosis of  By: Hetty Ely MD, Franne Grip    Coronary artery disease involving native coronary artery of native heart without angina pectoris 12/06/2017   Cramp in limb 10/30/2013   Depression    ECZEMA 10/14/2008   Qualifier: Diagnosis of  By: Hetty Ely MD, Franne Grip    Essential hypertension 12/05/2001   Qualifier: Diagnosis of  By: Hetty Ely MD, Franne Grip    GERD 10/14/2008   Qualifier: Diagnosis of  By: Hetty Ely MD, Franne Grip    Hesitancy 11/19/2013   History of chickenpox    History of colon polyps    HTN (hypertension) 10/30/2013   HYPERLIPIDEMIA 08/07/1993   Qualifier: Diagnosis of  By: Hetty Ely MD, Franne Grip    Hyperlipidemia LDL goal <70 10/30/2013   Ischemic cardiomyopathy    a. late presenting anterior STEMI 05/29/2017, TTE 05/30/17: EF 30-35%, AK of the mid-apicalanteroseptal, anterior, antlat, & apical wall, Gr1DD; c. 08/2017 Echo: EF 30-35%, mid-apicalanteroseptal, ant, apical AK, Gr1 DD.   Myasthenia gravis (HCC)    Need for shingles vaccine 02/16/2014   Palpitations 07/02/2020   Paroxysmal atrial fibrillation (HCC) 12/06/2017   Peyronie's disease 11/15/2006   Qualifier: Diagnosis of  By: Hetty Ely MD, Franne Grip    RECTAL BLEEDING, HX OF 11/15/2006   Qualifier: Diagnosis of  By: Hetty Ely MD, Franne Grip    Routine general medical examination at a health care facility 11/19/2013  Screening for prostate cancer 11/19/2013   SHOULDER PAIN, LEFT, CHRONIC 12/05/2006   Qualifier: Diagnosis of  By: Hetty Ely MD, Franne Grip    Tremor 05/16/2018   Urine incontinence    VITAMIN D DEFICIENCY 04/29/2009   Qualifier: Diagnosis of  By: Hetty Ely MD, Franne Grip    Past Surgical History:  Procedure Laterality Date   CARDIAC CATHETERIZATION     CHOLECYSTECTOMY     LEFT HEART CATH AND CORONARY ANGIOGRAPHY N/A 05/29/2017   Procedure: LEFT HEART CATH AND CORONARY ANGIOGRAPHY;  Surgeon: Yvonne Kendall, MD;  Location: [facility]  INVASIVE CV LAB;  Service: Cardiovascular;  Laterality: N/A;   TONSILLECTOMY AND ADENOIDECTOMY      Current Meds  Medication Sig   aspirin EC 81 MG tablet Take 1 tablet (81 mg total) by mouth daily. Start once you have finished your plavix.   atorvastatin (LIPITOR) 40 MG tablet Take 1 tablet (40 mg total) by mouth daily at 6 PM.   carvedilol (COREG) 6.25 MG tablet Take 1 tablet (6.25 mg total) by mouth 2 (two) times daily with a meal.   furosemide (LASIX) 20 MG tablet Take 1 tablet (20 mg total) by mouth daily as needed.   levothyroxine (SYNTHROID, LEVOTHROID) 75 MCG tablet Take 75 mcg by mouth every morning.   omeprazole (PRILOSEC) 20 MG capsule Take 20 mg by mouth daily.   PARoxetine (PAXIL) 20 MG tablet Take 20 mg by mouth daily.   tamsulosin (FLOMAX) 0.4 MG CAPS capsule Take 0.4 mg by mouth daily.    Allergies: Other and Sulfonamide derivatives  Social History   Tobacco Use   Smoking status: Former   Smokeless tobacco: Former   Tobacco comments:    quit around 1970's  Vaping Use   Vaping Use: Never used  Substance Use Topics   Alcohol use: No   Drug use: No    Family History  Problem Relation Age of Onset   Hyperlipidemia Mother    Arthritis Father    Hyperlipidemia Father     Review of Systems: A 12-system review of systems was performed and was negative except as noted in the HPI.  --------------------------------------------------------------------------------------------------  Physical Exam: BP 126/73 (BP Location: Left Arm, Patient Position: Sitting, Cuff Size: Large)   Pulse 63   Ht 5\' 9"  (1.753 m)   Wt 188 lb (85.3 kg)   SpO2 94%   BMI 27.76 kg/m  Position Blood pressure (mmHg) Heart rate (bpm)  Lying 133/72 63  Sitting 101/68 69  Standing 112/65 51  Standing (3 minutes) 140/74 72   General:  NAD. Neck: No JVD or HJR. Lungs: Clear to auscultation bilaterally without wheezes or crackles. Heart: Regular rate and rhythm with 1/6 systolic murmur.   No rubs or gallops. Abdomen: Soft, nontender, nondistended. Extremities: 1+ left and trace right calf edema.  EKG: Normal sinus rhythm with isolated PVC and nonspecific T wave changes.  No significant change from prior tracing on 06/17/2022.  Lab Results  Component Value Date   WBC 10.5 06/17/2022   HGB 10.5 (L) 06/17/2022   HCT 34.4 (L) 06/17/2022   MCV 91.5 06/17/2022   PLT 144 (L) 06/17/2022    Lab Results  Component Value Date   NA 141 06/17/2022   K 4.7 06/17/2022   CL 107 06/17/2022   CO2 24 06/17/2022   BUN 25 (H) 06/17/2022   CREATININE 1.09 06/17/2022   GLUCOSE 114 (H) 06/17/2022   ALT 23 06/17/2022    Lab Results  Component Value Date  CHOL 89 05/30/2017   HDL 46 05/30/2017   LDLCALC 34 05/30/2017   LDLDIRECT 190.3 08/23/2009   TRIG 44 05/30/2017   CHOLHDL 1.9 05/30/2017    --------------------------------------------------------------------------------------------------  ASSESSMENT AND PLAN: Orthostatic hypotension and syncope: I suspect that Raymond Rios' recent fall preceded by transient loss of consciousness was due to orthostatic hypotension.  We discussed the importance of maintaining adequate hydration, though we will need to balance this with his heart failure.  I have advised him to only use his as needed furosemide for objective findings such as worsening edema and weight gain.  I will also decrease his carvedilol to 3.125 mg twice daily.  We discussed importance of sitting/standing up slowly to mitigate the effects of transient blood pressure drops.  We will also repeat an echocardiogram to ensure that Raymond Rios's cardiomyopathy has not worsened and that he has not developed significant valvular heart disease to explain his symptoms.  We will defer ambulatory cardiac monitoring for now, given that his episode is most consistent with orthostatic hypotension and not a primary arrhythmic event.  Coronary artery disease without angina: Continue secondary  prevention with aspirin and atorvastatin.  Chronic HFrEF: Raymond Rios has mild leg edema but is without frank heart failure symptoms.  We agreed to continue as needed furosemide as detailed above and decrease his carvedilol to 3.125 mg twice daily.  We will defer challenging him with an ACE inhibitor/ARB again in the setting of his symptomatic orthostatic hypotension.  Follow-up: Return to clinic  Yvonne Kendall, MD 07/06/2022 11:34 AM

## 2022-07-06 NOTE — Patient Instructions (Addendum)
Medication Instructions:  DECREASE Carvedilol to 3.125 mg twice a day  Take Lasix 20 mg  daily as needed ONLY for swelling or weight gain of 2 lbs overnight or 5 lbs in a week  *If you need a refill on your cardiac medications before your next appointment, please call your pharmacy*   Lab Work: None ordered today   Testing/Procedures: Your physician has requested that you have an echocardiogram. Echocardiography is a painless test that uses sound waves to create images of your heart. It provides your doctor with information about the size and shape of your heart and how well your heart's chambers and valves are working.   You may receive an ultrasound enhancing agent through an IV if needed to better visualize your heart during the echo. This procedure takes approximately one hour.  There are no restrictions for this procedure.  This will take place at [address] [address], [place] [address]    Follow-Up: At [facility], you and your health needs are our priority.  As part of our continuing mission to provide you with exceptional heart care, we have created designated Provider Care Teams.  These Care Teams include your primary Cardiologist (physician) and Advanced Practice Providers (APPs -  Physician Assistants and Nurse Practitioners) who all work together to provide you with the care you need, when you need it.  We recommend signing up for the patient portal called "MyChart".  Sign up information is provided on this After Visit Summary.  MyChart is used to connect with patients for Virtual Visits (Telemedicine).  Patients are able to view lab/test results, encounter notes, upcoming appointments, etc.  Non-urgent messages can be sent to your provider as well.   To learn more about what you can do with MyChart, go to [email or web address].    Your next appointment:   6 week(s)  The format for your next appointment:   In Person  Provider:    You may see Yvonne Kendall, MD or one of the following Advanced Practice Providers on your designated Care Team:   Nicolasa Ducking, NP Eula Listen, PA-C Cadence Fransico Michael, PA-C Charlsie Quest, NP

## 2022-07-08 ENCOUNTER — Encounter: Payer: Self-pay | Admitting: Internal Medicine

## 2022-07-08 DIAGNOSIS — I951 Orthostatic hypotension: Secondary | ICD-10-CM | POA: Insufficient documentation

## 2022-07-08 DIAGNOSIS — R55 Syncope and collapse: Secondary | ICD-10-CM | POA: Insufficient documentation

## 2022-07-10 ENCOUNTER — Encounter: Payer: Self-pay | Admitting: Podiatry

## 2022-08-29 ENCOUNTER — Other Ambulatory Visit: Payer: Medicare HMO

## 2022-09-08 ENCOUNTER — Ambulatory Visit: Payer: Medicare HMO | Admitting: Internal Medicine

## 2022-09-14 ENCOUNTER — Ambulatory Visit: Payer: Medicare HMO | Admitting: Podiatry

## 2022-09-18 ENCOUNTER — Encounter: Payer: Self-pay | Admitting: Internal Medicine

## 2022-09-20 ENCOUNTER — Ambulatory Visit: Payer: Medicare HMO | Attending: Internal Medicine

## 2022-09-20 ENCOUNTER — Telehealth: Payer: Self-pay | Admitting: Internal Medicine

## 2022-09-20 DIAGNOSIS — I5022 Chronic systolic (congestive) heart failure: Secondary | ICD-10-CM

## 2022-09-20 DIAGNOSIS — R55 Syncope and collapse: Secondary | ICD-10-CM

## 2022-09-20 LAB — ECHOCARDIOGRAM COMPLETE
AR max vel: 1.66 cm2
AV Area VTI: 1.82 cm2
AV Area mean vel: 1.7 cm2
AV Mean grad: 7 mmHg
AV Peak grad: 13.2 mmHg
Ao pk vel: 1.82 m/s
Area-P 1/2: 3.3 cm2
S' Lateral: 5.1 cm

## 2022-09-20 MED ORDER — PERFLUTREN LIPID MICROSPHERE
1.0000 mL | INTRAVENOUS | Status: AC | PRN
  Administered 2022-09-20: 2 mL via INTRAVENOUS

## 2022-09-20 NOTE — Telephone Encounter (Signed)
Patient's son is returning call to discuss echo results.

## 2022-09-20 NOTE — Telephone Encounter (Signed)
Pt's son made aware of ECHO results along with MD's recommendations. Son verbalized understanding.   Nelva Bush, MD 09/20/2022  2:48 PM EST     Please let Mr. Jeane know that his echocardiogram again shows that his heart is severely weakened, slightly worse compared to 2019.  No significant valvular abnormalities were identified.  I recommend that he continue his current medications and follow-up in the office next week as scheduled.

## 2022-09-23 NOTE — Progress Notes (Unsigned)
Cardiology Office Note    Date:  09/27/2022   ID:  Raymond Rios, DOB 1931/01/30, MRN [id number]  PCP:  Albina Billet, MD  Cardiologist:  Nelva Bush, MD  Electrophysiologist:  None   Chief Complaint: Follow-up  History of Present Illness:   Raymond Rios is a 89 y.o. male with history of CAD with late presenting anterior STEMI in 05/2017, HFrEF secondary to ICM, PAF, myasthenia gravis, HTN, HLD, dementia, and COPD who presents for follow-up of CAD and cardiomyopathy.   He was admitted in 05/2017 with a late presenting anterior STEMI.  Echo showed an EF of 30 to 35%.  LHC showed a thrombotic occlusion of the LAD and otherwise nonobstructive disease.  Medical therapy was recommended given late presentation and absence of chest pain.  During the admission, he was noted to have A-fib and was placed on amiodarone with conversion to sinus rhythm.  Oral anticoagulation was not initiated given DAPT and history of dementia.  Follow-up echo in 08/2017 showed persistent LV dysfunction with an EF of 30 to 35%.  Note indicates he previously declined ICD therapy.  Carvedilol has previously been reduced secondary to orthostatic lightheadedness and bradycardia.  He was seen in the ED in 06/2022 after sustaining a fall.  He used the restroom overnight and felt lightheaded.  He believed he may have blacked out for a second or two.  CT of the head did not show any traumatic injuries.  He was last seen in the office in 06/2022 and was feeling a bit better, though did continue to have some orthostasis.  He had not passed out or fallen.  He was without symptoms of angina or cardiac decompensation.  Given noted concern for orthostasis, he was advised to take as needed furosemide for objective findings such as worsening edema or weight gain.  His carvedilol was decreased to 3.125 mg twice daily.  Repeat echo earlier this month showed an EF of 20 to 25% with, global hypokinesis, grade 1 diastolic dysfunction, low normal RV  systolic function, severely dilated left atrium and aortic valve sclerosis without evidence of stenosis.  He comes in accompanied by a family member today and is doing very well from a cardiac perspective.  He is without symptoms of angina or cardiac decompensation.  No dyspnea or palpitations.  Following the decreasing of carvedilol, he has not had any further falls, dizziness, orthostasis, presyncope, or syncope.  No bleeding concerns.  He feels like his lower extremity swelling is stable.  He sleeps on 1 pillow at baseline.  No early satiety or PND.  His weight is up 7 pounds today when compared to his visit in November.  He has been taking his furosemide approximately 2-3 times per week.  With regards to his echo findings both the patient and family member reported the patient does not want to do pursue further cardiac testing, up to and including cardiac catheterization or potential ICD implantation.  He feels well at this time and does not have any acute cardiac concerns.   Labs independently reviewed: 09/2022 - TSH normal, BUN 23, serum creatinine 1.17, potassium 5.0, TC 134, TG 124, HDL 42, LDL 70 06/2022 - magnesium 2.0, albumin 3.6, AST/ALT normal  Past Medical History:  Diagnosis Date   ALLERGY 11/08/2007   Qualifier: Diagnosis of  By: Council Mechanic MD, Hilaria Ota    Anemia    ANXIETY 08/07/1948   Qualifier: Diagnosis of  By: Council Mechanic MD, Hilaria Ota    Arthritis  Chronic systolic heart failure ([place])    COPD 08/07/1988   Qualifier: Diagnosis of  By: Council Mechanic MD, Hilaria Ota    Coronary artery disease involving native coronary artery of native heart without angina pectoris 12/06/2017   Cramp in limb 10/30/2013   Depression    ECZEMA 10/14/2008   Qualifier: Diagnosis of  By: Council Mechanic MD, Hilaria Ota    Essential hypertension 12/05/2001   Qualifier: Diagnosis of  By: Council Mechanic MD, Hilaria Ota    GERD 10/14/2008   Qualifier: Diagnosis of  By: Council Mechanic MD, Hilaria Ota    Hesitancy 11/19/2013    History of chickenpox    History of colon polyps    HTN (hypertension) 10/30/2013   HYPERLIPIDEMIA 08/07/1993   Qualifier: Diagnosis of  By: Council Mechanic MD, Hilaria Ota    Hyperlipidemia LDL goal <70 10/30/2013   Ischemic cardiomyopathy    a. late presenting anterior STEMI 05/29/2017, TTE 05/30/17: EF 30-35%, AK of the mid-apicalanteroseptal, anterior, antlat, & apical wall, Gr1DD; c. 08/2017 Echo: EF 30-35%, mid-apicalanteroseptal, ant, apical AK, Gr1 DD.   Myasthenia gravis ([place])    Need for shingles vaccine 02/16/2014   Palpitations 07/02/2020   Paroxysmal atrial fibrillation ([place]) 12/06/2017   Peyronie's disease 11/15/2006   Qualifier: Diagnosis of  By: Council Mechanic MD, Hilaria Ota    RECTAL BLEEDING, HX OF 11/15/2006   Qualifier: Diagnosis of  By: Council Mechanic MD, Hilaria Ota    Routine general medical examination at a health care facility 11/19/2013   Screening for prostate cancer 11/19/2013   SHOULDER PAIN, LEFT, CHRONIC 12/05/2006   Qualifier: Diagnosis of  By: Council Mechanic MD, Hilaria Ota    Tremor 05/16/2018   Urine incontinence    VITAMIN D DEFICIENCY 04/29/2009   Qualifier: Diagnosis of  By: Council Mechanic MD, Hilaria Ota     Past Surgical History:  Procedure Laterality Date   CARDIAC CATHETERIZATION     CHOLECYSTECTOMY     LEFT HEART CATH AND CORONARY ANGIOGRAPHY N/A 05/29/2017   Procedure: LEFT HEART CATH AND CORONARY ANGIOGRAPHY;  Surgeon: Nelva Bush, MD;  Location: [facility];  Service: Cardiovascular;  Laterality: N/A;   TONSILLECTOMY AND ADENOIDECTOMY      Current Medications: Current Meds  Medication Sig   aspirin EC 81 MG tablet Take 1 tablet (81 mg total) by mouth daily. Start once you have finished your plavix.   atorvastatin (LIPITOR) 40 MG tablet Take 1 tablet (40 mg total) by mouth daily at 6 PM.   carvedilol (COREG) 3.125 MG tablet Take 1 tablet (3.125 mg total) by mouth 2 (two) times daily with a meal.   levothyroxine (SYNTHROID, LEVOTHROID) 75 MCG tablet Take 75 mcg  by mouth every morning.   omeprazole (PRILOSEC) 20 MG capsule Take 20 mg by mouth daily.   PARoxetine (PAXIL) 20 MG tablet Take 20 mg by mouth daily.   tamsulosin (FLOMAX) 0.4 MG CAPS capsule Take 0.4 mg by mouth daily.   [DISCONTINUED] furosemide (LASIX) 20 MG tablet Take 1 tablet (20 mg total) by mouth daily as needed.    Allergies:   Other and Sulfonamide derivatives   Social History   Socioeconomic History   Marital status: Married    Spouse name: Not on file   Number of children: 3   Years of education: Not on file   Highest education level: Not on file  Occupational History   Occupation: Retired    Fish farm manager: RETIRED  Tobacco Use   Smoking status: Former   Smokeless tobacco: Former   Tobacco  comments:    quit around 1970's  Vaping Use   Vaping Use: Never used  Substance and Sexual Activity   Alcohol use: No   Drug use: No   Sexual activity: Never  Other Topics Concern   Not on file  Social History Narrative   Not on file   Social Determinants of Health   Financial Resource Strain: Not on file  Food Insecurity: Not on file  Transportation Needs: Not on file  Physical Activity: Not on file  Stress: Not on file  Social Connections: Not on file     Family History:  The patient's family history includes Arthritis in his father; Hyperlipidemia in his father and mother.  ROS:   12-point review of systems is negative unless otherwise noted in HPI.   EKGs/Labs/Other Studies Reviewed:    Studies reviewed were summarized above. The additional studies were reviewed today:  2D echo 09/20/2022: 1. Left ventricular ejection fraction, by estimation, is 20 to 25%. The  left ventricle has severely decreased function. The left ventricle  demonstrates global hypokinesis. Left ventricular diastolic parameters are  consistent with Grade I diastolic  dysfunction (impaired relaxation).   2. Right ventricular systolic function is low normal. The right  ventricular size is  not well visualized.   3. Left atrial size was severely dilated.   4. The mitral valve is normal in structure. No evidence of mitral valve  regurgitation.   5. The aortic valve was not well visualized. Aortic valve regurgitation  is not visualized. Aortic valve sclerosis/calcification is present,  without any evidence of aortic stenosis.  ___________  [facility] patch 12/2017: The patient was enrolled for 30 days. 81% of the monitoring period yielded diagnostic tracings. The predominant rhythm was sinus with an average rate of 61 bpm (range 49-99 bpm). There were 2 episodes of non-sustained ventricular tachycardia lasting 5 beats. Isolated PAC's and PVC's were noted. There was no sustained arrhythmia or prolonged pause.   Predominantly sinus rhythm with isolated PAC's and PVC's.  Two episodes of non-sustained ventricular tachycardia (5 beats). __________   Limited echo 08/31/2017: - Left ventricle: The cavity size was normal. Wall thickness was    normal. Systolic function was moderately to severely reduced. The    estimated ejection fraction was in the range of 30% to 35%.    Akinesis of the mid-apicalanteroseptal, anterior, and apical    myocardium. Doppler parameters are consistent with abnormal left    ventricular relaxation (grade 1 diastolic dysfunction).   Impressions:   - no significant change since last echo. ___________   2D echo 05/30/2017: - Procedure narrative: Transthoracic echocardiography. The study    was technically difficult.  - Left ventricle: There was mild focal basal hypertrophy of the    septum. Systolic function was moderately to severely reduced. The    estimated ejection fraction was in the range of 30% to 35%. There    is akinesis of the mid-apicalanteroseptal, anterior,    anterolateral, and apical myocardium. Doppler parameters are    consistent with abnormal left ventricular relaxation (grade 1    diastolic dysfunction).  - Aortic valve: Mildly  calcified annulus. Trileaflet.  - Left atrium: The atrium was mildly dilated. ___________   [facility] 05/29/2017: Conclusions: Thrombotic total occlusion of proximal LAD. Dominant LCx with 50% OM3 stenosis and 80% lPDA lesion. Anterior and apical akinesis with LVEF ~35%. Moderately elevated left ventricular filling pressure.   Recommendations: Given onset of symptoms > 24 hours ago, anteroseptal Q-waves on EKG, hemodynamic/electrical  stability, and resolution of chest pain, I recommend medical therapy. Dual antiplatelet therapy with aspirin and clopidogrel for up to 12 months, as tolerated. If no evidence of bleeding from cath site, consider starting heparin infusion in 4 hours. Aggressive secondary prevention, including high-intensity statin therapy. Transthoracic echo. Hold home atenolol; start carvedilol as heart rate tolerates. Consider adding ACEI/ARB tomorrow as BP and renal function allow. Consider gentle diuresis, given reduced LVEF and elevated LVEDP.    EKG:  EKG is ordered today.  The EKG ordered today demonstrates NSR, 71 bpm, poor R wave progression along the precordial leads, lateral T wave inversion, consistent with prior tracing.  Recent Labs: 06/17/2022: ALT 23; B Natriuretic Peptide 171.1; BUN 25; Creatinine, Ser 1.09; Hemoglobin 10.5; Magnesium 2.0; Platelets 144; Potassium 4.7; Sodium 141; TSH 8.290  Recent Lipid Panel    Component Value Date/Time   CHOL 89 05/30/2017 0827   TRIG 44 05/30/2017 0827   HDL 46 05/30/2017 0827   CHOLHDL 1.9 05/30/2017 0827   VLDL 9 05/30/2017 0827   LDLCALC 34 05/30/2017 0827   LDLDIRECT 190.3 08/23/2009 0819    PHYSICAL EXAM:    VS:  BP 136/70 (BP Location: Left Arm, Patient Position: Sitting, Cuff Size: Normal)   Pulse 71   Ht 5' 7"$  (1.702 m)   Wt 195 lb (88.5 kg)   SpO2 95%   BMI 30.54 kg/m   BMI: Body mass index is 30.54 kg/m.  Physical Exam Vitals reviewed.  Constitutional:      Appearance: He is well-developed.   HENT:     Head: Normocephalic and atraumatic.  Eyes:     General:        Right eye: No discharge.        Left eye: No discharge.  Neck:     Vascular: No JVD.  Cardiovascular:     Rate and Rhythm: Normal rate and regular rhythm.     Heart sounds: S1 normal and S2 normal. Heart sounds not distant. No midsystolic click and no opening snap. Murmur heard.     Systolic murmur is present with a grade of 1/6 at the upper right sternal border.     No friction rub.  Pulmonary:     Effort: Pulmonary effort is normal. No respiratory distress.     Breath sounds: Normal breath sounds. No decreased breath sounds, wheezing or rales.  Chest:     Chest wall: No tenderness.  Abdominal:     General: There is no distension.     Palpations: Abdomen is soft.     Tenderness: There is no abdominal tenderness.  Musculoskeletal:     Cervical back: Normal range of motion.     Right lower leg: Edema present.     Left lower leg: Edema present.     Comments: Trace to 1+ bilateral pretibial edema.  Skin:    General: Skin is warm and dry.     Nails: There is no clubbing.  Neurological:     Mental Status: He is alert and oriented to person, place, and time.  Psychiatric:        Speech: Speech normal.        Behavior: Behavior normal.        Thought Content: Thought content normal.        Judgment: Judgment normal.     Wt Readings from Last 3 Encounters:  09/27/22 195 lb (88.5 kg)  07/06/22 188 lb (85.3 kg)  06/17/22 190 lb (86.2 kg)  ASSESSMENT & PLAN:   CAD involving the native coronary arteries without angina: He is doing well, and is without symptoms of angina.  Continue secondary prevention and risk factor modification including aspirin, atorvastatin, and carvedilol.  He is noted to have a progressive cardiomyopathy as outlined below, though declines cardiac cath or invasive cardiac testing.  Risks and benefits of cardiac catheter were discussed in detail.  The patient's family member is in  agreement with the patient's decision.  In this setting, no further cardiac testing or invasive intervention is planned.  Patient prefers to prefer on quality of life.    HFrEF secondary to ICM: He does appear mildly volume up with a weight that is up 7 pounds when compared to his last clinic visit.  Echo from earlier this month showed a worsening in his cardiomyopathy with an EF of 20 to 25% (previously 30 to 35% in 2019).  We did have a frank discussion regarding evaluation of his cardiomyopathy including diagnostic R/LHC.  However, patient declines pursuing further invasive cardiac testing.  He also declines EP evaluation for possible ICD.  He was made aware of the risks of death given his cardiomyopathy.  Relative hypotension with prior orthostasis limits escalation of GDMT.  Not currently on ACE inhibitor/ARB/ARNI/MRA/SGLT2 inhibitor secondary to symptomatic orthostasis.  He will take furosemide 20 mg daily for the next 2 days followed by every other day thereafter.  HTN with orthostatic hypotension: Blood pressure stable in the office today.  Medical therapy as outlined above.  HLD: LDL 70 in 09/2022 with normal AST/ALT in 06/2022.  8 atorvastatin.   Disposition: F/u with Dr. Saunders Revel or an APP in 2-3 months at his request.   Medication Adjustments/Labs and Tests Ordered: Current medicines are reviewed at length with the patient today.  Concerns regarding medicines are outlined above. Medication changes, Labs and Tests ordered today are summarized above and listed in the Patient Instructions accessible in Encounters.   Signed, Christell Faith, PA-C 09/27/2022 12:40 PM     [address] [place], [place] [address] [phone]

## 2022-09-25 NOTE — Telephone Encounter (Signed)
Pt's daughter made aware and verbalized understanding.  Nelva Bush, MD 09/25/2022  8:55 AM EST     Labs reviewed; lipids well-controlled on last check in 03/2022.  Continue current medications and follow-up as previously recommended.

## 2022-09-25 NOTE — Telephone Encounter (Signed)
Pt's daughter is returning call and is requesting call to go over results as well.

## 2022-09-27 ENCOUNTER — Ambulatory Visit: Payer: Medicare HMO | Admitting: Internal Medicine

## 2022-09-27 ENCOUNTER — Encounter: Payer: Self-pay | Admitting: Physician Assistant

## 2022-09-27 ENCOUNTER — Ambulatory Visit: Payer: Medicare HMO | Attending: Internal Medicine | Admitting: Physician Assistant

## 2022-09-27 VITALS — BP 136/70 | HR 71 | Ht 67.0 in | Wt 195.0 lb

## 2022-09-27 DIAGNOSIS — I2510 Atherosclerotic heart disease of native coronary artery without angina pectoris: Secondary | ICD-10-CM

## 2022-09-27 DIAGNOSIS — I255 Ischemic cardiomyopathy: Secondary | ICD-10-CM

## 2022-09-27 DIAGNOSIS — I5022 Chronic systolic (congestive) heart failure: Secondary | ICD-10-CM | POA: Diagnosis not present

## 2022-09-27 DIAGNOSIS — I10 Essential (primary) hypertension: Secondary | ICD-10-CM

## 2022-09-27 DIAGNOSIS — I951 Orthostatic hypotension: Secondary | ICD-10-CM

## 2022-09-27 DIAGNOSIS — E785 Hyperlipidemia, unspecified: Secondary | ICD-10-CM

## 2022-09-27 MED ORDER — FUROSEMIDE 20 MG PO TABS: 20.0000 mg | ORAL_TABLET | ORAL | 3 refills | Status: DC

## 2022-09-27 NOTE — Patient Instructions (Addendum)
Medication Instructions:  Your physician has recommended you make the following change in your medication:   TAKE Furosemide once daily for 3 days THEN take Furosemide to every other day  *If you need a refill on your cardiac medications before your next appointment, please call your pharmacy*   Lab Work: None  If you have labs (blood work) drawn today and your tests are completely normal, you will receive your results only by: [place] (if you have MyChart) OR A paper copy in the mail If you have any lab test that is abnormal or we need to change your treatment, we will call you to review the results.   Testing/Procedures: None    Follow-Up: At [facility], you and your health needs are our priority.  As part of our continuing mission to provide you with exceptional heart care, we have created designated Provider Care Teams.  These Care Teams include your primary Cardiologist (physician) and Advanced Practice Providers (APPs -  Physician Assistants and Nurse Practitioners) who all work together to provide you with the care you need, when you need it.  We recommend signing up for the patient portal called "MyChart".  Sign up information is provided on this After Visit Summary.  MyChart is used to connect with patients for Virtual Visits (Telemedicine).  Patients are able to view lab/test results, encounter notes, upcoming appointments, etc.  Non-urgent messages can be sent to your provider as well.   To learn more about what you can do with MyChart, go to [email or web address].    Your next appointment:   3 month(s)  Provider:   Nelva Bush, MD or Christell Faith, PA-C

## 2022-11-02 ENCOUNTER — Ambulatory Visit: Payer: Medicare HMO | Admitting: Podiatry

## 2022-11-02 ENCOUNTER — Encounter: Payer: Self-pay | Admitting: Podiatry

## 2022-11-02 DIAGNOSIS — M79674 Pain in right toe(s): Secondary | ICD-10-CM

## 2022-11-02 DIAGNOSIS — M79675 Pain in left toe(s): Secondary | ICD-10-CM | POA: Diagnosis not present

## 2022-11-02 DIAGNOSIS — B351 Tinea unguium: Secondary | ICD-10-CM | POA: Diagnosis not present

## 2022-11-02 NOTE — Progress Notes (Signed)

## 2022-11-10 ENCOUNTER — Emergency Department: Payer: Medicare HMO

## 2022-11-10 ENCOUNTER — Other Ambulatory Visit: Payer: Self-pay

## 2022-11-10 ENCOUNTER — Encounter: Payer: Self-pay | Admitting: Family Medicine

## 2022-11-10 ENCOUNTER — Inpatient Hospital Stay
Admission: EM | Admit: 2022-11-10 | Discharge: 2022-11-13 | DRG: 193 | Disposition: A | Discharge status: Home Health | Payer: Medicare HMO | Attending: Internal Medicine | Admitting: Internal Medicine

## 2022-11-10 DIAGNOSIS — F0394 Unspecified dementia, unspecified severity, with anxiety: Secondary | ICD-10-CM | POA: Diagnosis present

## 2022-11-10 DIAGNOSIS — G7000 Myasthenia gravis without (acute) exacerbation: Secondary | ICD-10-CM | POA: Diagnosis present

## 2022-11-10 DIAGNOSIS — I110 Hypertensive heart disease with heart failure: Secondary | ICD-10-CM | POA: Diagnosis present

## 2022-11-10 DIAGNOSIS — I252 Old myocardial infarction: Secondary | ICD-10-CM | POA: Diagnosis not present

## 2022-11-10 DIAGNOSIS — Z8616 Personal history of COVID-19: Secondary | ICD-10-CM

## 2022-11-10 DIAGNOSIS — F419 Anxiety disorder, unspecified: Secondary | ICD-10-CM

## 2022-11-10 DIAGNOSIS — Z87891 Personal history of nicotine dependence: Secondary | ICD-10-CM | POA: Diagnosis not present

## 2022-11-10 DIAGNOSIS — Z66 Do not resuscitate: Secondary | ICD-10-CM | POA: Diagnosis present

## 2022-11-10 DIAGNOSIS — Z79890 Hormone replacement therapy (postmenopausal): Secondary | ICD-10-CM | POA: Diagnosis not present

## 2022-11-10 DIAGNOSIS — I5043 Acute on chronic combined systolic (congestive) and diastolic (congestive) heart failure: Secondary | ICD-10-CM

## 2022-11-10 DIAGNOSIS — N400 Enlarged prostate without lower urinary tract symptoms: Secondary | ICD-10-CM | POA: Diagnosis present

## 2022-11-10 DIAGNOSIS — J441 Chronic obstructive pulmonary disease with (acute) exacerbation: Secondary | ICD-10-CM | POA: Diagnosis present

## 2022-11-10 DIAGNOSIS — J189 Pneumonia, unspecified organism: Principal | ICD-10-CM | POA: Diagnosis present

## 2022-11-10 DIAGNOSIS — Z7982 Long term (current) use of aspirin: Secondary | ICD-10-CM

## 2022-11-10 DIAGNOSIS — I255 Ischemic cardiomyopathy: Secondary | ICD-10-CM | POA: Diagnosis present

## 2022-11-10 DIAGNOSIS — Z83438 Family history of other disorder of lipoprotein metabolism and other lipidemia: Secondary | ICD-10-CM

## 2022-11-10 DIAGNOSIS — E785 Hyperlipidemia, unspecified: Secondary | ICD-10-CM | POA: Diagnosis present

## 2022-11-10 DIAGNOSIS — J440 Chronic obstructive pulmonary disease with acute lower respiratory infection: Secondary | ICD-10-CM | POA: Diagnosis present

## 2022-11-10 DIAGNOSIS — I2510 Atherosclerotic heart disease of native coronary artery without angina pectoris: Secondary | ICD-10-CM | POA: Diagnosis present

## 2022-11-10 DIAGNOSIS — Z882 Allergy status to sulfonamides status: Secondary | ICD-10-CM | POA: Diagnosis not present

## 2022-11-10 DIAGNOSIS — F0393 Unspecified dementia, unspecified severity, with mood disturbance: Secondary | ICD-10-CM | POA: Diagnosis present

## 2022-11-10 DIAGNOSIS — J9601 Acute respiratory failure with hypoxia: Secondary | ICD-10-CM | POA: Diagnosis present

## 2022-11-10 DIAGNOSIS — E039 Hypothyroidism, unspecified: Secondary | ICD-10-CM | POA: Diagnosis present

## 2022-11-10 DIAGNOSIS — Z79899 Other long term (current) drug therapy: Secondary | ICD-10-CM

## 2022-11-10 DIAGNOSIS — K219 Gastro-esophageal reflux disease without esophagitis: Secondary | ICD-10-CM | POA: Diagnosis present

## 2022-11-10 DIAGNOSIS — F32A Depression, unspecified: Secondary | ICD-10-CM

## 2022-11-10 DIAGNOSIS — I10 Essential (primary) hypertension: Secondary | ICD-10-CM | POA: Diagnosis present

## 2022-11-10 DIAGNOSIS — I480 Paroxysmal atrial fibrillation: Secondary | ICD-10-CM | POA: Diagnosis present

## 2022-11-10 DIAGNOSIS — Z8261 Family history of arthritis: Secondary | ICD-10-CM

## 2022-11-10 HISTORY — DX: Acute respiratory failure with hypoxia: J96.01

## 2022-11-10 LAB — BASIC METABOLIC PANEL
Anion gap: 12 (ref 5–15)
BUN: 35 mg/dL — ABNORMAL HIGH (ref 8–23)
CO2: 25 mmol/L (ref 22–32)
Calcium: 8.8 mg/dL — ABNORMAL LOW (ref 8.9–10.3)
Chloride: 100 mmol/L (ref 98–111)
Creatinine, Ser: 1.23 mg/dL (ref 0.61–1.24)
GFR, Estimated: 56 mL/min — ABNORMAL LOW (ref >60)
Glucose, Bld: 119 mg/dL — ABNORMAL HIGH (ref 70–99)
Potassium: 3.6 mmol/L (ref 3.5–5.1)
Sodium: 137 mmol/L (ref 135–145)

## 2022-11-10 LAB — CBC
HCT: 39.7 % (ref 39.0–52.0)
Hemoglobin: 12.5 g/dL — ABNORMAL LOW (ref 13.0–17.0)
MCH: 27.6 pg (ref 26.0–34.0)
MCHC: 31.5 g/dL (ref 30.0–36.0)
MCV: 87.6 fL (ref 80.0–100.0)
Platelets: 165 10*3/uL (ref 150–400)
RBC: 4.53 MIL/uL (ref 4.22–5.81)
RDW: 14.1 % (ref 11.5–15.5)
WBC: 7.9 10*3/uL (ref 4.0–10.5)
nRBC: 0 % (ref 0.0–0.2)

## 2022-11-10 LAB — BRAIN NATRIURETIC PEPTIDE: B Natriuretic Peptide: 88.9 pg/mL (ref 0.0–100.0)

## 2022-11-10 MED ORDER — SODIUM CHLORIDE 0.9 % IV SOLN
500.0000 mg | INTRAVENOUS | Status: DC
  Administered 2022-11-11: 500 mg via INTRAVENOUS
  Filled 2022-11-10: qty 500
  Filled 2022-11-10: qty 5

## 2022-11-10 MED ORDER — HYDROCOD POLI-CHLORPHE POLI ER 10-8 MG/5ML PO SUER: 5.0000 mL | Freq: Two times a day (BID) | ORAL | Status: DC | PRN

## 2022-11-10 MED ORDER — GUAIFENESIN ER 600 MG PO TB12
600.0000 mg | ORAL_TABLET | Freq: Two times a day (BID) | ORAL | Status: DC
  Administered 2022-11-10 – 2022-11-13 (×6): 600 mg via ORAL
  Filled 2022-11-10 (×6): qty 1

## 2022-11-10 MED ORDER — TRAZODONE HCL 50 MG PO TABS
25.0000 mg | ORAL_TABLET | Freq: Every evening | ORAL | Status: DC | PRN
  Administered 2022-11-11 – 2022-11-12 (×2): 25 mg via ORAL
  Filled 2022-11-10 (×2): qty 1

## 2022-11-10 MED ORDER — ONDANSETRON HCL 4 MG/2ML IJ SOLN: 4.0000 mg | Freq: Four times a day (QID) | INTRAMUSCULAR | Status: DC | PRN

## 2022-11-10 MED ORDER — METHYLPREDNISOLONE SODIUM SUCC 125 MG IJ SOLR
125.0000 mg | INTRAMUSCULAR | Status: AC
  Administered 2022-11-10: 125 mg via INTRAVENOUS
  Filled 2022-11-10: qty 2

## 2022-11-10 MED ORDER — LEVOTHYROXINE SODIUM 50 MCG PO TABS
75.0000 ug | ORAL_TABLET | Freq: Every day | ORAL | Status: DC
  Administered 2022-11-11 – 2022-11-13 (×3): 75 ug via ORAL
  Filled 2022-11-10 (×3): qty 2

## 2022-11-10 MED ORDER — ALBUTEROL SULFATE (2.5 MG/3ML) 0.083% IN NEBU
5.0000 mg | INHALATION_SOLUTION | Freq: Once | RESPIRATORY_TRACT | Status: AC
  Administered 2022-11-10: 5 mg via RESPIRATORY_TRACT
  Filled 2022-11-10: qty 6

## 2022-11-10 MED ORDER — ACETAMINOPHEN 325 MG PO TABS: 650.0000 mg | ORAL_TABLET | Freq: Four times a day (QID) | ORAL | Status: DC | PRN

## 2022-11-10 MED ORDER — PAROXETINE HCL 20 MG PO TABS
20.0000 mg | ORAL_TABLET | Freq: Every day | ORAL | Status: DC
  Administered 2022-11-11 – 2022-11-13 (×3): 20 mg via ORAL
  Filled 2022-11-10 (×3): qty 1

## 2022-11-10 MED ORDER — IPRATROPIUM-ALBUTEROL 0.5-2.5 (3) MG/3ML IN SOLN
3.0000 mL | Freq: Four times a day (QID) | RESPIRATORY_TRACT | Status: DC
  Administered 2022-11-10 – 2022-11-13 (×10): 3 mL via RESPIRATORY_TRACT
  Filled 2022-11-10 (×10): qty 3

## 2022-11-10 MED ORDER — ACETAMINOPHEN 650 MG RE SUPP: 650.0000 mg | Freq: Four times a day (QID) | RECTAL | Status: DC | PRN

## 2022-11-10 MED ORDER — SODIUM CHLORIDE 0.9 % IV SOLN
500.0000 mg | Freq: Once | INTRAVENOUS | Status: AC
  Administered 2022-11-10: 500 mg via INTRAVENOUS
  Filled 2022-11-10: qty 5

## 2022-11-10 MED ORDER — PREDNISONE 20 MG PO TABS
40.0000 mg | ORAL_TABLET | Freq: Every day | ORAL | Status: DC
  Administered 2022-11-13: 40 mg via ORAL
  Filled 2022-11-10: qty 2

## 2022-11-10 MED ORDER — FUROSEMIDE 40 MG PO TABS
20.0000 mg | ORAL_TABLET | ORAL | Status: DC
  Administered 2022-11-11 – 2022-11-13 (×2): 20 mg via ORAL
  Filled 2022-11-10 (×2): qty 1

## 2022-11-10 MED ORDER — ONDANSETRON HCL 4 MG PO TABS: 4.0000 mg | ORAL_TABLET | Freq: Four times a day (QID) | ORAL | Status: DC | PRN

## 2022-11-10 MED ORDER — SODIUM CHLORIDE 0.9 % IV SOLN
2.0000 g | INTRAVENOUS | Status: DC
  Administered 2022-11-11 – 2022-11-12 (×2): 2 g via INTRAVENOUS
  Filled 2022-11-10: qty 20
  Filled 2022-11-10: qty 2
  Filled 2022-11-10: qty 20

## 2022-11-10 MED ORDER — ENOXAPARIN SODIUM 40 MG/0.4ML IJ SOSY
40.0000 mg | PREFILLED_SYRINGE | INTRAMUSCULAR | Status: DC
  Administered 2022-11-10 – 2022-11-12 (×3): 40 mg via SUBCUTANEOUS
  Filled 2022-11-10 (×3): qty 0.4

## 2022-11-10 MED ORDER — ATORVASTATIN CALCIUM 20 MG PO TABS
40.0000 mg | ORAL_TABLET | Freq: Every day | ORAL | Status: DC
  Administered 2022-11-11 – 2022-11-12 (×2): 40 mg via ORAL
  Filled 2022-11-10 (×2): qty 2

## 2022-11-10 MED ORDER — SODIUM CHLORIDE 0.9 % IV SOLN: INTRAVENOUS | Status: DC

## 2022-11-10 MED ORDER — SODIUM CHLORIDE 0.9 % IV SOLN
2.0000 g | Freq: Once | INTRAVENOUS | Status: AC
  Administered 2022-11-10: 2 g via INTRAVENOUS
  Filled 2022-11-10: qty 20

## 2022-11-10 MED ORDER — TAMSULOSIN HCL 0.4 MG PO CAPS
0.4000 mg | ORAL_CAPSULE | Freq: Every day | ORAL | Status: DC
  Administered 2022-11-11 – 2022-11-13 (×3): 0.4 mg via ORAL
  Filled 2022-11-10 (×3): qty 1

## 2022-11-10 MED ORDER — PANTOPRAZOLE SODIUM 40 MG PO TBEC
40.0000 mg | DELAYED_RELEASE_TABLET | Freq: Every day | ORAL | Status: DC
  Administered 2022-11-11 – 2022-11-13 (×3): 40 mg via ORAL
  Filled 2022-11-10 (×3): qty 1

## 2022-11-10 MED ORDER — ASPIRIN 81 MG PO TBEC
81.0000 mg | DELAYED_RELEASE_TABLET | Freq: Every day | ORAL | Status: DC
  Administered 2022-11-11 – 2022-11-13 (×3): 81 mg via ORAL
  Filled 2022-11-10 (×3): qty 1

## 2022-11-10 MED ORDER — IPRATROPIUM-ALBUTEROL 0.5-2.5 (3) MG/3ML IN SOLN
3.0000 mL | Freq: Once | RESPIRATORY_TRACT | Status: AC
  Administered 2022-11-10: 3 mL via RESPIRATORY_TRACT
  Filled 2022-11-10: qty 3

## 2022-11-10 MED ORDER — MAGNESIUM HYDROXIDE 400 MG/5ML PO SUSP: 30.0000 mL | Freq: Every day | ORAL | Status: DC | PRN

## 2022-11-10 MED ORDER — METHYLPREDNISOLONE SODIUM SUCC 40 MG IJ SOLR
40.0000 mg | Freq: Two times a day (BID) | INTRAMUSCULAR | Status: AC
  Administered 2022-11-11 – 2022-11-12 (×4): 40 mg via INTRAVENOUS
  Filled 2022-11-10 (×4): qty 1

## 2022-11-10 MED ORDER — LISINOPRIL 5 MG PO TABS
2.5000 mg | ORAL_TABLET | Freq: Every day | ORAL | Status: DC
  Administered 2022-11-10 – 2022-11-13 (×4): 2.5 mg via ORAL
  Filled 2022-11-10 (×4): qty 0.5

## 2022-11-10 MED ORDER — IOHEXOL 350 MG/ML SOLN
75.0000 mL | Freq: Once | INTRAVENOUS | Status: AC | PRN
  Administered 2022-11-10: 75 mL via INTRAVENOUS

## 2022-11-10 MED ORDER — FUROSEMIDE 10 MG/ML IJ SOLN
40.0000 mg | Freq: Once | INTRAMUSCULAR | Status: AC
  Administered 2022-11-10: 40 mg via INTRAVENOUS
  Filled 2022-11-10: qty 4

## 2022-11-10 MED ORDER — CARVEDILOL 6.25 MG PO TABS
3.1250 mg | ORAL_TABLET | Freq: Two times a day (BID) | ORAL | Status: DC
  Administered 2022-11-11 – 2022-11-13 (×5): 3.125 mg via ORAL
  Filled 2022-11-10 (×5): qty 1

## 2022-11-10 MED ORDER — SODIUM CHLORIDE 0.9 % IV SOLN: 1.0000 g | INTRAVENOUS | Status: DC

## 2022-11-10 NOTE — ED Notes (Signed)
Pt placed on 2L [place] at this time. Pt O2 increased to 92%.

## 2022-11-10 NOTE — Assessment & Plan Note (Signed)
Continue statin. 

## 2022-11-10 NOTE — Assessment & Plan Note (Signed)
Continue home Paxil

## 2022-11-10 NOTE — ED Provider Notes (Signed)
[facility] Provider Note    Event Date/Time   First MD Initiated Contact with Patient 11/10/22 1740     (approximate)   History   Chief Complaint: Shortness of Breath   HPI  Aarib D Blumenfeld is a 89 y.o. male with a history of paroxysmal atrial fibrillation, COPD, GERD, hypertension who comes to the ED complaining of shortness of breath.  He reports that he had COVID 2 weeks ago, and has had gradually worsening shortness of breath since then.  Denies chest pain.  Went to see his doctor today and was sent to the ED.  Shortness of breath is worse with walking.  No fever, no productive cough     Physical Exam   Triage Vital Signs: ED Triage Vitals  Enc Vitals Group     BP 11/10/22 1733 (!) 152/100     Pulse Rate 11/10/22 1733 81     Resp 11/10/22 1733 (!) 24     Temp 11/10/22 1733 98 F (36.7 C)     Temp src --      SpO2 11/10/22 1732 (!) 84 %     Weight --      Height --      Head Circumference --      Peak Flow --      Pain Score 11/10/22 1732 0     Pain Loc --      Pain Edu? --      Excl. in GC? --     Most recent vital signs: Vitals:   11/10/22 1745 11/10/22 2119  BP: (!) 145/82 135/60  Pulse: 80 79  Resp: (!) 27 (!) 23  Temp:  98.1 F (36.7 C)  SpO2: 98% 93%    General: Awake, no distress.  CV:  Good peripheral perfusion.  Regular rate and rhythm Resp:  Normal effort.  Diffuse expiratory wheezing.  Markedly prolonged expiratory phase.  No crackles Abd:  No distention.  Soft nontender Other:  No lower extremity edema or calf tenderness.  Normal muscle tone.   ED Results / Procedures / Treatments   Labs (all labs ordered are listed, but only abnormal results are displayed) Labs Reviewed  BASIC METABOLIC PANEL - Abnormal; Notable for the following components:      Result Value   Glucose, Bld 119 (*)    BUN 35 (*)    Calcium 8.8 (*)    GFR, Estimated 56 (*)    All other components within normal limits  CBC - Abnormal; Notable  for the following components:   Hemoglobin 12.5 (*)    All other components within normal limits  BRAIN NATRIURETIC PEPTIDE  BASIC METABOLIC PANEL  CBC  HIV ANTIBODY (ROUTINE TESTING W REFLEX)     EKG Interpreted by me Sinus rhythm rate of 85.  Normal axis intervals ST segments and T waves.  Poor R wave progression   RADIOLOGY Chest x-ray interpreted by me, appears unremarkable.  Radiology report reviewed.  CT angiogram chest negative for PE, does show some mild inflammatory/infectious changes.   PROCEDURES:  Procedures   MEDICATIONS ORDERED IN ED: Medications  enoxaparin (LOVENOX) injection 40 mg (40 mg Subcutaneous Given 11/10/22 2122)  acetaminophen (TYLENOL) tablet 650 mg (has no administration in time range)    Or  acetaminophen (TYLENOL) suppository 650 mg (has no administration in time range)  traZODone (DESYREL) tablet 25 mg (has no administration in time range)  ondansetron (ZOFRAN) tablet 4 mg (has no administration in time range)  Or  ondansetron (ZOFRAN) injection 4 mg (has no administration in time range)  magnesium hydroxide (MILK OF MAGNESIA) suspension 30 mL (has no administration in time range)  cefTRIAXone (ROCEPHIN) 1 g in sodium chloride 0.9 % 100 mL IVPB (has no administration in time range)  methylPREDNISolone sodium succinate (SOLU-MEDROL) 40 mg/mL injection 40 mg (has no administration in time range)    Followed by  predniSONE (DELTASONE) tablet 40 mg (has no administration in time range)  ipratropium-albuterol (DUONEB) 0.5-2.5 (3) MG/3ML nebulizer solution 3 mL (3 mLs Nebulization Given 11/10/22 2049)  guaiFENesin (MUCINEX) 12 hr tablet 600 mg (600 mg Oral Given 11/10/22 2122)  chlorpheniramine-HYDROcodone (TUSSIONEX) 10-8 MG/5ML suspension 5 mL (has no administration in time range)  aspirin EC tablet 81 mg (has no administration in time range)  atorvastatin (LIPITOR) tablet 40 mg (has no administration in time range)  carvedilol (COREG) tablet 3.125  mg (has no administration in time range)  furosemide (LASIX) tablet 20 mg (has no administration in time range)  PARoxetine (PAXIL) tablet 20 mg (has no administration in time range)  levothyroxine (SYNTHROID) tablet 75 mcg (has no administration in time range)  pantoprazole (PROTONIX) EC tablet 40 mg (has no administration in time range)  tamsulosin (FLOMAX) capsule 0.4 mg (has no administration in time range)  cefTRIAXone (ROCEPHIN) 2 g in sodium chloride 0.9 % 100 mL IVPB (has no administration in time range)  azithromycin (ZITHROMAX) 500 mg in sodium chloride 0.9 % 250 mL IVPB (has no administration in time range)  lisinopril (ZESTRIL) tablet 2.5 mg (has no administration in time range)  ipratropium-albuterol (DUONEB) 0.5-2.5 (3) MG/3ML nebulizer solution 3 mL (3 mLs Nebulization Given 11/10/22 1802)  albuterol (PROVENTIL) (2.5 MG/3ML) 0.083% nebulizer solution 5 mg (5 mg Nebulization Given 11/10/22 1802)  iohexol (OMNIPAQUE) 350 MG/ML injection 75 mL (75 mLs Intravenous Contrast Given 11/10/22 1832)  methylPREDNISolone sodium succinate (SOLU-MEDROL) 125 mg/2 mL injection 125 mg (125 mg Intravenous Given 11/10/22 1932)  cefTRIAXone (ROCEPHIN) 2 g in sodium chloride 0.9 % 100 mL IVPB (0 g Intravenous Stopped 11/10/22 2026)  azithromycin (ZITHROMAX) 500 mg in sodium chloride 0.9 % 250 mL IVPB (0 mg Intravenous Stopped 11/10/22 2123)  furosemide (LASIX) injection 40 mg (40 mg Intravenous Given 11/10/22 2123)     IMPRESSION / MDM / ASSESSMENT AND PLAN / ED COURSE  I reviewed the triage vital signs and the nursing notes.  DDx: Acute bronchitis, pneumonia, COPD, pleural effusion, pulmonary edema, AKI, electrolyte abnormality, pulmonary embolism  Patient's presentation is most consistent with acute presentation with potential threat to life or bodily function.  Patient presents with shortness of breath, progressively worsening over the last 2 to 3 weeks since having COVID.  Found to be hypoxic on room air  at 85%, improved with 2 L nasal cannula.  Exam reveals loud wheezing and markedly prolonged expiratory phase consistent with broncho constriction.  Will give bronchodilators.     ----------------------------------------- 7:40 PM on 11/10/2022 -----------------------------------------  EMR advises patient has a history of myasthenia gravis but he completely denies this.  Additionally I do not see it listed in his problem list.  Will give Solu-Medrol along with Rocephin and azithromycin.  He is still very wheezy and symptomatic.  Case discussed with hospitalist for further management.      FINAL CLINICAL IMPRESSION(S) / ED DIAGNOSES   Final diagnoses:  Acute respiratory failure with hypoxia     Rx / DC Orders   ED Discharge Orders     None  Note:  This document was prepared using Dragon voice recognition software and may include unintentional dictation errors.   Sharman CheekStafford, Gussie Murton, MD 11/10/22 2137

## 2022-11-10 NOTE — Assessment & Plan Note (Signed)
-   Continue Flomax 

## 2022-11-10 NOTE — H&P (Signed)
[place]   PATIENT NAME: Raymond Rios    MR#:  [id number]  DATE OF BIRTH:  06-Jul-1931  DATE OF ADMISSION:  11/10/2022  PRIMARY CARE PHYSICIAN: Jaclyn Shaggy, MD   Patient is coming from: Home  REQUESTING/REFERRING PHYSICIAN: Alfonse Flavors, MD  CHIEF COMPLAINT:   Chief Complaint  Patient presents with   Shortness of Breath    HISTORY OF PRESENT ILLNESS:  Raymond Rios is a 89 y.o. Caucasian male with medical history significant for anxiety, depression, osteoarthritis, COPD, coronary artery disease, combined systolic and diastolic CHF, paroxysmal atrial fibrillation, essential hypertension, and GERD, who presented to the ER with acute onset of dyspnea with associated cough productive of yellowish sputum and wheezing for the last couple weeks.  He was diagnosed with COVID.  That 3 weeks ago and was having diminished appetite and then with associated weakness, insomnia in addition to cough without fever or chills or nausea or vomiting or diarrhea.  He currently denies any chest pain or palpitations.  No dysuria, oliguria or hematuria or flank pain.  Is significantly dyspneic today's pulse extremity was 84% on room air and improved on 2 L.  He admits to orthopnea sleeping in a recliner as well as lower extremity edema and dyspnea on exertion.  ED Course: When he came to the ER BP was 152/100, respirate was 24 and.  She was 84% on room air and 98% on 2 days of O2 nasal cannula.  Labs revealed a BUN of 35 with otherwise unremarkable BMP.  CBC was within normal.  BNP was 88.9. EKG as reviewed by me : EKG showed normal sinus rhythm with a rate of 85 with occasional premature ventricular complexes, Q waves anteroseptal. Imaging: 2 view chest x-ray showed mild interstitial opacities possible due to pulmonary edema.  Chest CTA revealed no evidence for PE but showed multiple ill-defined nodular densities in the right upper lobe measuring up to 1 cm.  Some of these have minimal surrounding  groundglass opacities.  There were tree-in-bud opacities in the right upper lobe.  Findings are favored as infectious/inflammatory but neoplasm cannot be excluded with recommendation for follow-up.  Chest CT in 3 to 6 months.  It showed right hilar lymphadenopathy likely reactive and aortic atherosclerosis.  The patient was given 2 g of IV Rocephin, DuoNeb and 125 mg IV Solu-Medrol.  He will be admitted to a medical telemetry bed for further evaluation and management. PAST MEDICAL HISTORY:   Past Medical History:  Diagnosis Date   ALLERGY 11/08/2007   Qualifier: Diagnosis of  By: Hetty Ely MD, Franne Grip    Anemia    ANXIETY 08/07/1948   Qualifier: Diagnosis of  By: Hetty Ely MD, Franne Grip    Arthritis    Chronic systolic heart failure    COPD 08/07/1988   Qualifier: Diagnosis of  By: Hetty Ely MD, Franne Grip    Coronary artery disease involving native coronary artery of native heart without angina pectoris 12/06/2017   Cramp in limb 10/30/2013   Depression    ECZEMA 10/14/2008   Qualifier: Diagnosis of  By: Hetty Ely MD, Franne Grip    Essential hypertension 12/05/2001   Qualifier: Diagnosis of  By: Hetty Ely MD, Franne Grip    GERD 10/14/2008   Qualifier: Diagnosis of  By: Hetty Ely MD, Franne Grip    Hesitancy 11/19/2013   History of chickenpox    History of colon polyps    HTN (hypertension) 10/30/2013   HYPERLIPIDEMIA 08/07/1993   Qualifier: Diagnosis  of  By: Hetty Ely MD, Franne Grip    Hyperlipidemia LDL goal <70 10/30/2013   Ischemic cardiomyopathy    a. late presenting anterior STEMI 05/29/2017, TTE 05/30/17: EF 30-35%, AK of the mid-apicalanteroseptal, anterior, antlat, & apical wall, Gr1DD; c. 08/2017 Echo: EF 30-35%, mid-apicalanteroseptal, ant, apical AK, Gr1 DD.   Myasthenia gravis    Need for shingles vaccine 02/16/2014   Palpitations 07/02/2020   Paroxysmal atrial fibrillation 12/06/2017   Peyronie's disease 11/15/2006   Qualifier: Diagnosis of  By: Hetty Ely MD, Franne Grip    RECTAL  BLEEDING, HX OF 11/15/2006   Qualifier: Diagnosis of  By: Hetty Ely MD, Franne Grip    Routine general medical examination at a health care facility 11/19/2013   Screening for prostate cancer 11/19/2013   SHOULDER PAIN, LEFT, CHRONIC 12/05/2006   Qualifier: Diagnosis of  By: Hetty Ely MD, Franne Grip    Tremor 05/16/2018   Urine incontinence    VITAMIN D DEFICIENCY 04/29/2009   Qualifier: Diagnosis of  By: Hetty Ely MD, Franne Grip     PAST SURGICAL HISTORY:   Past Surgical History:  Procedure Laterality Date   CARDIAC CATHETERIZATION     CHOLECYSTECTOMY     LEFT HEART CATH AND CORONARY ANGIOGRAPHY N/A 05/29/2017   Procedure: LEFT HEART CATH AND CORONARY ANGIOGRAPHY;  Surgeon: Yvonne Kendall, MD;  Location: [facility] INVASIVE CV LAB;  Service: Cardiovascular;  Laterality: N/A;   TONSILLECTOMY AND ADENOIDECTOMY      SOCIAL HISTORY:   Social History   Tobacco Use   Smoking status: Former   Smokeless tobacco: Former   Tobacco comments:    quit around 1970's  Substance Use Topics   Alcohol use: No    FAMILY HISTORY:   Family History  Problem Relation Age of Onset   Hyperlipidemia Mother    Arthritis Father    Hyperlipidemia Father     DRUG ALLERGIES:   Allergies  Allergen Reactions   Other Hives   Sulfonamide Derivatives     REACTION: unspecified    REVIEW OF SYSTEMS:   ROS As per history of present illness. All pertinent systems were reviewed above. Constitutional, HEENT, cardiovascular, respiratory, GI, GU, musculoskeletal, neuro, psychiatric, endocrine, integumentary and hematologic systems were reviewed and are otherwise negative/unremarkable except for positive findings mentioned above in the HPI.   MEDICATIONS AT HOME:   Prior to Admission medications   Medication Sig Start Date End Date Taking? Authorizing Provider  aspirin EC 81 MG tablet Take 1 tablet (81 mg total) by mouth daily. Start once you have finished your plavix. 05/16/18   End, Cristal Deer, MD   atorvastatin (LIPITOR) 40 MG tablet Take 1 tablet (40 mg total) by mouth daily at 6 PM. 12/28/21   Dunn, Raymon Mutton, PA-C  carvedilol (COREG) 3.125 MG tablet Take 1 tablet (3.125 mg total) by mouth 2 (two) times daily with a meal. 07/06/22   End, Cristal Deer, MD  furosemide (LASIX) 20 MG tablet Take 1 tablet (20 mg total) by mouth every other day. 09/27/22 12/26/22  Sondra Barges, PA-C  levothyroxine (SYNTHROID, LEVOTHROID) 75 MCG tablet Take 75 mcg by mouth every morning. 03/23/17   [provider]  omeprazole (PRILOSEC) 20 MG capsule Take 20 mg by mouth daily.    [provider]  PARoxetine (PAXIL) 20 MG tablet Take 20 mg by mouth daily. 11/16/21   [provider]  tamsulosin (FLOMAX) 0.4 MG CAPS capsule Take 0.4 mg by mouth daily.    [provider]  VITAL SIGNS:  Blood pressure (!) 166/85, pulse 82, temperature 98.4 F (36.9 C), resp. rate 19, SpO2 98 %.  PHYSICAL EXAMINATION:  Physical Exam  GENERAL:  89 y.o.-year-old patient lying in the bed with mild respiratory distress with conversational dyspnea.Marland Kitchen.  EYES: Pupils equal, round, reactive to light and accommodation. No scleral icterus. Extraocular muscles intact.  HEENT: Head atraumatic, normocephalic. Oropharynx and nasopharynx clear.  NECK:  Supple, no jugular venous distention. No thyroid enlargement, no tenderness.  LUNGS: Diminished bibasal breath sounds with bibasal rales and diffuse expiratory wheezes with tight expiratory airflow with harsh vesicular breathing.  No use of accessory muscles of respiration.  CARDIOVASCULAR: Regular rate and rhythm, S1, S2 normal. No murmurs, rubs, or gallops.  ABDOMEN: Soft, nondistended, nontender. Bowel sounds present. No organomegaly or mass.  EXTREMITIES: 1+ bilateral lower extremity pitting edema with no cyanosis, or clubbing.  NEUROLOGIC: Cranial nerves II through XII are intact. Muscle strength 5/5 in all extremities. Sensation intact. Gait not checked.   PSYCHIATRIC: The patient is alert and oriented x 3.  Normal affect and good eye contact. SKIN: No obvious rash, lesion, or ulcer.   LABORATORY PANEL:   CBC Recent Labs  Lab 11/10/22 1750  WBC 7.9  HGB 12.5*  HCT 39.7  PLT 165   ------------------------------------------------------------------------------------------------------------------  Chemistries  Recent Labs  Lab 11/10/22 1750  NA 137  K 3.6  CL 100  CO2 25  GLUCOSE 119*  BUN 35*  CREATININE 1.23  CALCIUM 8.8*   ------------------------------------------------------------------------------------------------------------------  Cardiac Enzymes No results for input(s): "TROPONINI" in the last 168 hours. ------------------------------------------------------------------------------------------------------------------  RADIOLOGY:  CT Angio Chest PE W and/or Wo Contrast  Result Date: 11/10/2022 CLINICAL DATA:  High probability for PE. Shortness of breath. COVID 2 weeks ago. EXAM: CT ANGIOGRAPHY CHEST WITH CONTRAST TECHNIQUE: Multidetector CT imaging of the chest was performed using the standard protocol during bolus administration of intravenous contrast. Multiplanar CT image reconstructions and MIPs were obtained to evaluate the vascular anatomy. RADIATION DOSE REDUCTION: This exam was performed according to the departmental dose-optimization program which includes automated exposure control, adjustment of the mA and/or kV according to patient size and/or use of iterative reconstruction technique. CONTRAST:  75mL OMNIPAQUE IOHEXOL 350 MG/ML SOLN COMPARISON:  None Available. FINDINGS: Cardiovascular: Satisfactory opacification of the pulmonary arteries to the segmental level. No evidence of pulmonary embolism. Normal heart size. No pericardial effusion. There are atherosclerotic calcifications of the aorta. Mediastinum/Nodes: There are enlarged right hilar lymph nodes measuring up to 12 mm short axis. No other enlarged  mediastinal or hilar lymph nodes are seen. Visualized esophagus and thyroid gland are within normal limits. There is a small hiatal hernia. Lungs/Pleura: There are multiple ill-defined nodular densities throughout the right upper lobe measuring up to 1 cm image 5/36. Some of these have minimal surrounding ground-glass opacities. There are additional tree-in-bud opacities in the right upper lobe. There are atelectatic changes in the lung bases. There is no pleural effusion or pneumothorax. There is central peribronchial wall thickening bilaterally. Upper Abdomen: Cholecystectomy clips are present. There is an 11 mm left renal cyst. Musculoskeletal: There is mild chronic appearing compression deformity of T12. Review of the MIP images confirms the above findings. IMPRESSION: 1. No evidence for pulmonary embolism. 2. Multiple ill-defined nodular densities in the right upper lobe measuring up to 1 cm. Some of these have minimal surrounding ground-glass opacities. Tree-in-bud opacities in the right upper lobe. Findings are favored as infectious/inflammatory. Neoplasm can not be excluded. Follow-up chest CT recommended in  3-6 months. 3. Right hilar lymphadenopathy, likely reactive. Aortic Atherosclerosis (ICD10-I70.0). Electronically Signed   By: Darliss CheneyAmy  Guttmann M.D.   On: 11/10/2022 18:53   DG Chest 2 View  Result Date: 11/10/2022 CLINICAL DATA:  Shortness of breath EXAM: CHEST - 2 VIEW COMPARISON:  Chest x-ray dated June 17, 2022 FINDINGS: Cardiac and mediastinal contours are unchanged. Mild diffuse interstitial opacities. No pleural effusion or pneumothorax. IMPRESSION: Mild interstitial opacities, possibly due to pulmonary edema. Electronically Signed   By: Allegra LaiLeah  Strickland M.D.   On: 11/10/2022 18:09      IMPRESSION AND PLAN:  Assessment and Plan: * Acute respiratory failure with hypoxia - The patient will be admitted to a medical telemetry bed. - This is likely secondary to acute CHF as well as  exacerbation. - O2 protocol will be followed. - Management otherwise as below.  COPD with acute exacerbation - We will place him on DuoNebs, scheduled and as needed basis. - We will add steroid therapy while monitoring his myasthenia gravis.  CAP (community acquired pneumonia) - We will place him on IV Rocephin and Zithromax. - Will add mucolytic therapy and follow-up blood cultures. - This could be contributing to his COPD exacerbation.  Acute on chronic combined systolic and diastolic CHF (congestive heart failure) - We will continue sinew diuresis with IV Lasix. - Most recent 2D echo in February 2024 revealed an EF of 25% with grade 1 diastolic dysfunction. - Follow serial troponins. - This could be cor pulmonale due to his COPD exacerbation and suspected pneumonia.  Anxiety and depression - We will continue his Paxil.  BPH (benign prostatic hyperplasia) - We will continue Flomax.  Coronary artery disease - We will continue aspirin, statin therapy and beta-blocker therapy with Coreg.  Hypothyroidism - We will continue Synthroid.  Dyslipidemia - We will continue statin therapy.  Essential hypertension - We will continue his antihypertensives.    DVT prophylaxis: Lovenox. Advanced Care Planning:  Code Status: The patient is DNR and DNI. Family Communication:  The plan of care was discussed in details with the patient (and family). I answered all questions. The patient agreed to proceed with the above mentioned plan. Further management will depend upon hospital course. Disposition Plan: Back to previous home environment Consults called: none. All the records are reviewed and case discussed with ED provider.  Status is: Inpatient   At the time of the admission, it appears that the appropriate admission status for this patient is inpatient.  This is judged to be reasonable and necessary in order to provide the required intensity of service to ensure the patient's  safety given the presenting symptoms, physical exam findings and initial radiographic and laboratory data in the context of comorbid conditions.  The patient requires inpatient status due to high intensity of service, high risk of further deterioration and high frequency of surveillance required.  I certify that at the time of admission, it is my clinical judgment that the patient will require inpatient hospital care extending more than 2 midnights.                            Dispo: The patient is from: Home              Anticipated d/c is to: Home              Patient currently is not medically stable to d/c.  Difficult to place patient: No  Hannah Beat M.D on 11/10/2022 at 10:30 PM  [facility]   From 7 PM-7 AM, contact night-coverage [email or web address]  CC: Primary care physician; Jaclyn Shaggy, MD

## 2022-11-10 NOTE — Assessment & Plan Note (Signed)
Continue Synthroid °

## 2022-11-10 NOTE — Assessment & Plan Note (Signed)
Continue Coreg, lisinopril, PO Lasix

## 2022-11-10 NOTE — Assessment & Plan Note (Signed)
RESOLVED - sats stable on room air and rest and ambulating.   Due primarily to pneumonia and COPD exacerbation, partially heart failure/volume overload. --Mgmt of underlying issues as outlined --Supplement O2, target sat 90-94, wean as tolerated --Check ambulatory O2 sats daily --Incentive spirometer, flutter valve

## 2022-11-10 NOTE — ED Triage Notes (Signed)
Pt comes with c/o sob. Pt had covid 2 weeks ago. Pt has noticeable rhonchi noted. Pt has labored breathing. Pt currently 84 % RA.   Pt states this all started about 3 weeks ago. Pt states no chest pain.

## 2022-11-10 NOTE — Assessment & Plan Note (Signed)
Treated initially with IV Lasix, excellent response. Echo in February 2024 with EF of 25% with G1DD. 4/6 - appears euvolemic on exam --Continue PO Lasix 20 mg daily --Strict I/O's & daily weights

## 2022-11-10 NOTE — Assessment & Plan Note (Signed)
Stable, chronic. --Continue aspirin, statin and Coreg

## 2022-11-10 NOTE — Assessment & Plan Note (Addendum)
RUL opacities on CTA chest, given clinical picture at this time favor pneumonia.  Likely driving COPD exaceration. --Continue IV Rocephin and Zithromax. - Scheduled and PRN Mucinex --Mgmt of COPD exacerbation as outlined --Diuresis as needed, see CHF --Repeat CT chest in 3-6 months to ensure resolution of nodular RUL opacities (neoplasm not ruled out)

## 2022-11-10 NOTE — Assessment & Plan Note (Signed)
Pt reports remote smoking history, denies formal diagnosis of COPD or asthma.  Has significant wheezing on exam. --Treated with IV steroids --Discharge on short Prednisone taper --Scheduled DuoNeb, PRN albuterol nebs. - No issues noted w/myasthenia gravis on steroids --weaned off O2

## 2022-11-11 DIAGNOSIS — J189 Pneumonia, unspecified organism: Secondary | ICD-10-CM | POA: Diagnosis not present

## 2022-11-11 DIAGNOSIS — J441 Chronic obstructive pulmonary disease with (acute) exacerbation: Secondary | ICD-10-CM | POA: Diagnosis not present

## 2022-11-11 DIAGNOSIS — J9601 Acute respiratory failure with hypoxia: Secondary | ICD-10-CM | POA: Diagnosis not present

## 2022-11-11 LAB — BASIC METABOLIC PANEL
Anion gap: 10 (ref 5–15)
BUN: 32 mg/dL — ABNORMAL HIGH (ref 8–23)
CO2: 27 mmol/L (ref 22–32)
Calcium: 8.1 mg/dL — ABNORMAL LOW (ref 8.9–10.3)
Chloride: 102 mmol/L (ref 98–111)
Creatinine, Ser: 1.05 mg/dL (ref 0.61–1.24)
GFR, Estimated: >60 mL/min (ref >60)
Glucose, Bld: 166 mg/dL — ABNORMAL HIGH (ref 70–99)
Potassium: 3.9 mmol/L (ref 3.5–5.1)
Sodium: 139 mmol/L (ref 135–145)

## 2022-11-11 LAB — CBC
HCT: 35.1 % — ABNORMAL LOW (ref 39.0–52.0)
Hemoglobin: 10.9 g/dL — ABNORMAL LOW (ref 13.0–17.0)
MCH: 26.7 pg (ref 26.0–34.0)
MCHC: 31.1 g/dL (ref 30.0–36.0)
MCV: 86 fL (ref 80.0–100.0)
Platelets: 146 10*3/uL — ABNORMAL LOW (ref 150–400)
RBC: 4.08 MIL/uL — ABNORMAL LOW (ref 4.22–5.81)
RDW: 13.9 % (ref 11.5–15.5)
WBC: 3 10*3/uL — ABNORMAL LOW (ref 4.0–10.5)
nRBC: 0 % (ref 0.0–0.2)

## 2022-11-11 LAB — HIV ANTIBODY (ROUTINE TESTING W REFLEX): HIV Screen 4th Generation wRfx: NONREACTIVE

## 2022-11-11 MED ORDER — ALBUTEROL SULFATE (2.5 MG/3ML) 0.083% IN NEBU: 2.5000 mg | INHALATION_SOLUTION | RESPIRATORY_TRACT | Status: DC | PRN

## 2022-11-11 MED ORDER — IPRATROPIUM-ALBUTEROL 0.5-2.5 (3) MG/3ML IN SOLN: 3.0000 mL | RESPIRATORY_TRACT | Status: DC | PRN

## 2022-11-11 NOTE — Progress Notes (Addendum)
Progress Note   Patient: Raymond Rios OEH:[id number] DOB: 1930-09-10 DOA: 11/10/2022     1 DOS: the patient was seen and examined on 11/11/2022   Brief hospital course: HPI on admission 11/10/22 --  Raymond Rios is a 89 y.o. male with medical history significant for CAD with late presenting anterior STEMI in 05/2017, HFrEF secondary to ICM, PAF, myasthenia gravis, HTN, HLD, dementia, COPD anxiety, depression, osteoarthritis, and GERD, who presented to the ED for evaluation of dyspnea, cough productive of yellowish sputum, and wheezing for the last couple weeks.  He was diagnosed with COVID 3 weeks ago and was having diminished appetite, followed by generalized weakness, insomnia in addition to cough.  He presented significantly dyspneic with pulse ox 84% on room air, requiring supplemental oxygen.     In the ER - BP was 152/100, RR 24 and spO2 84% on room air, 98% on 2 L/min [place] O2. Labs notable for Cr 1.23, BUN 35, normal BNP 88.9, no leukocytosis (WBC 7.9k). and Hbg 12.5. Imaging -- chest xray with mild interstitial opacities.   CTA chest negative for PE, showed multiple nodular densities in RUL, up to 1 cm some with minimal surround ground glass opacities (infectious vs inflammatory but neoplasm not excluded).  Patient was admitted to the hospital, started on IV antibiotics for pneumonia, IV steroids and nebulizer treatments for acute exacerbation of COPD, and also IV diuresis.  4/6 -- appears euvolemic, no further IV diuresis for now.   Significant wheezing, continue IV steroids. Still requiring oxygen.  Assessment and Plan: * Acute respiratory failure with hypoxia Due primarily to pneumonia and COPD exacerbation, partially heart failure/volume overload. --Mgmt of underlying issues as outlined --Supplement O2, target sat 90-94, wean as tolerated --Check ambulatory O2 sats daily --Incentive spirometer, flutter valve  CAP (community acquired pneumonia) RUL opacities on CTA chest, given clinical  picture at this time favor pneumonia.  Likely driving COPD exaceration. --Continue IV Rocephin and Zithromax. - Scheduled and PRN Mucinex --Mgmt of COPD exacerbation as outlined --Diuresis as needed, see CHF --Repeat CT chest in 3-6 months to ensure resolution of nodular RUL opacities (neoplasm not ruled out)  COPD with acute exacerbation Pt reports remote smoking history, denies formal diagnosis of COPD or asthma.  Has significant wheezing on exam. --Continue IV steroids, transition to Prednisone in 1-2 more days pending improvement --Scheduled DuoNeb, PRN albuterol nebs. - Monitoring his myasthenia gravis on steroids --O2 per protocol  Acute on chronic combined systolic and diastolic CHF (congestive heart failure) Treated initially with IV Lasix, excellent response. Echo in February 2024 with EF of 25% with G1DD. 4/6 - appears euvolemic on exam --Continue PO Lasix 20 mg daily --Strict I/O's & daily weights  Anxiety and depression Continue home Paxil  BPH (benign prostatic hyperplasia) Continue Flomax  Coronary artery disease Stable, chronic. --Continue aspirin, statin and Coreg  Hypothyroidism Continue Synthroid  Dyslipidemia Continue statin   Myasthenia gravis Appears stable.  Monitor. Not on medical therapy per med history.  Essential hypertension Continue Coreg, lisinopril, PO Lasix        Subjective: Pt seen with son at bedside this AM.  He reports wheezing and cough ongoing.  Denies ever being told of having COPD or asthma.  Reports remote smoking history but not for very long.  No other acute complaints.  Physical Exam: Vitals:   11/11/22 0500 11/11/22 0746 11/11/22 0802 11/11/22 1129  BP:  (!) 146/78    Pulse:  67    Resp:  20  Temp:  (!) 97.5 F (36.4 C)    TempSrc:      SpO2:  (!) 72% 98% 97%  Weight: 81.8 kg     Height:       General exam: awake, appears fatigued, no acute distress HEENT: clear conjunctiva, anicteric sclera, moist mucus  membranes, hearing grossly normal  Respiratory system: diffuse expiratory wheezes,  increased respiratory effort, accessory muscle use, on 2 L/min [place] O2. Cardiovascular system: normal S1/S2, RRR, no JVD, murmurs, rubs, gallops, no pedal edema.   Gastrointestinal system: soft, NT, ND, no HSM felt, +bowel sounds. Central nervous system: A&O x3. no gross focal neurologic deficits, normal speech Extremities: moves all, no edema, normal tone Skin: dry, intact, normal temperature Psychiatry: normal mood, congruent affect   Data Reviewed:  Notable labs --- glucose 166, BUN 32, Ca 8.1, WBC 3.0, Hbg 10.9k, platelets 146k    Family Communication: son at bedside on rounds  Disposition: Status is: Inpatient Remains inpatient appropriate because: remains on IV therapies pending clinical improvement and weaning off oxygen   Planned Discharge Destination: Home    Time spent: 42 minutes  Author: Pennie Banter, DO 11/11/2022 4:13 PM  For on call review [email or web address].

## 2022-11-11 NOTE — Hospital Course (Addendum)
HPI on admission 11/10/22 --  Raymond Rios is a 89 y.o. male with medical history significant for CAD with late presenting anterior STEMI in 05/2017, HFrEF secondary to ICM, PAF, myasthenia gravis, HTN, HLD, dementia, COPD anxiety, depression, osteoarthritis, and GERD, who presented to the ED for evaluation of dyspnea, cough productive of yellowish sputum, and wheezing for the last couple weeks.  He was diagnosed with COVID 3 weeks ago and was having diminished appetite, followed by generalized weakness, insomnia in addition to cough.  He presented significantly dyspneic with pulse ox 84% on room air, requiring supplemental oxygen.     In the ER - BP was 152/100, RR 24 and spO2 84% on room air, 98% on 2 L/min [place] O2. Labs notable for Cr 1.23, BUN 35, normal BNP 88.9, no leukocytosis (WBC 7.9k). and Hbg 12.5. Imaging -- chest xray with mild interstitial opacities.   CTA chest negative for PE, showed multiple nodular densities in RUL, up to 1 cm some with minimal surround ground glass opacities (infectious vs inflammatory but neoplasm not excluded).  Patient was admitted to the hospital, started on IV antibiotics for pneumonia, IV steroids and nebulizer treatments for acute exacerbation of COPD, and also IV diuresis.  4/6 -- appears euvolemic, no further IV diuresis for now.   Significant wheezing, continue IV steroids. Still requiring oxygen.

## 2022-11-11 NOTE — Assessment & Plan Note (Signed)
Appears stable.  Monitor. Not on medical therapy per med history.

## 2022-11-12 DIAGNOSIS — J9601 Acute respiratory failure with hypoxia: Secondary | ICD-10-CM | POA: Diagnosis not present

## 2022-11-12 LAB — BASIC METABOLIC PANEL
Anion gap: 9 (ref 5–15)
BUN: 36 mg/dL — ABNORMAL HIGH (ref 8–23)
CO2: 26 mmol/L (ref 22–32)
Calcium: 8.1 mg/dL — ABNORMAL LOW (ref 8.9–10.3)
Chloride: 104 mmol/L (ref 98–111)
Creatinine, Ser: 1.19 mg/dL (ref 0.61–1.24)
GFR, Estimated: 58 mL/min — ABNORMAL LOW (ref >60)
Glucose, Bld: 145 mg/dL — ABNORMAL HIGH (ref 70–99)
Potassium: 3.7 mmol/L (ref 3.5–5.1)
Sodium: 139 mmol/L (ref 135–145)

## 2022-11-12 LAB — CBC
HCT: 32.9 % — ABNORMAL LOW (ref 39.0–52.0)
Hemoglobin: 10.5 g/dL — ABNORMAL LOW (ref 13.0–17.0)
MCH: 27.3 pg (ref 26.0–34.0)
MCHC: 31.9 g/dL (ref 30.0–36.0)
MCV: 85.7 fL (ref 80.0–100.0)
Platelets: 150 10*3/uL (ref 150–400)
RBC: 3.84 MIL/uL — ABNORMAL LOW (ref 4.22–5.81)
RDW: 14.1 % (ref 11.5–15.5)
WBC: 7 10*3/uL (ref 4.0–10.5)
nRBC: 0 % (ref 0.0–0.2)

## 2022-11-12 LAB — MAGNESIUM: Magnesium: 2.1 mg/dL (ref 1.7–2.4)

## 2022-11-12 MED ORDER — AZITHROMYCIN 250 MG PO TABS
500.0000 mg | ORAL_TABLET | Freq: Every day | ORAL | Status: DC
  Administered 2022-11-12: 500 mg via ORAL
  Filled 2022-11-12: qty 2

## 2022-11-12 NOTE — Evaluation (Signed)
Physical Therapy Evaluation Patient Details Name: Raymond Rios MRN: [id number] DOB: [birth year] Today's Date: 11/12/2022  History of Present Illness  Pt is an 89 y/o male admitted secondary to SOB. Pt found to have acute respiratory failure with hypoxia and CAP. PMH including but not limited to anxiety, depression, osteoarthritis, COPD, coronary artery disease, combined systolic and diastolic CHF, paroxysmal atrial fibrillation, essential hypertension, and GERD.   Clinical Impression   Pt presented supine in bed with HOB elevated, awake and willing to participate in therapy session. Prior to admission, pt reported that he was independent with ADLs and ambulated with use of a rollator or a cane as needed. Pt lives in an Independent Living facility [facility]). At the time of evaluation, pt overall moving well at a mod I level for bed mobility and transfers, and ambulating 67' with RW with supervision. Of note, ambulation distance limited secondary to increasing SOB. Pt's SpO2 decreasing to as low as 86% on RA with ambulation with steady return to >90% on RA with sitting rest break and instruction in pursed-lip breathing technique. Pt appears to be near his functional baseline in regards to mobility. Pt would continue to benefit from skilled physical therapy services at this time while admitted and after d/c to address the below listed limitations in order to improve overall safety and independence with functional mobility.  SpO2 on RA at rest = 93% SpO2 on RA with ambulation = 86% SpO2 after ambulation with sitting rest break (~1 min) = 92% RN was notified and aware.     Recommendations for follow up therapy are one component of a multi-disciplinary discharge planning process, led by the attending physician.  Recommendations may be updated based on patient status, additional functional criteria and insurance authorization.  Follow Up Recommendations       Assistance Recommended at Discharge Set up  Supervision/Assistance  Patient can return home with the following  A little help with walking and/or transfers;A little help with bathing/dressing/bathroom    Equipment Recommendations None recommended by PT  Recommendations for Other Services       Functional Status Assessment Patient has had a recent decline in their functional status and demonstrates the ability to make significant improvements in function in a reasonable and predictable amount of time.     Precautions / Restrictions Precautions Precautions: Fall Precaution Comments: monitor SpO2 Restrictions Weight Bearing Restrictions: No      Mobility  Bed Mobility Overal bed mobility: Modified Independent                  Transfers Overall transfer level: Modified independent Equipment used: Rolling walker (2 wheels)                    Ambulation/Gait Ambulation/Gait assistance: Supervision Gait Distance (Feet): 50 Feet Assistive device: Rolling walker (2 wheels) Gait Pattern/deviations: Step-through pattern, Decreased stride length, Trunk flexed Gait velocity: decreased     General Gait Details: pt with slow steady gait with use of RW, cueing needed initially to maintain proximity to RW, no overt LOB or need for physical assistance. distance limited secondary to increasing SOB  Stairs            Wheelchair Mobility    Modified Rankin (Stroke Patients Only)       Balance Overall balance assessment: Needs assistance Sitting-balance support: Feet supported, No upper extremity supported Sitting balance-Leahy Scale: Good     Standing balance support: During functional activity, Bilateral upper extremity supported, Single extremity  supported Standing balance-Leahy Scale: Poor                               Pertinent Vitals/Pain Pain Assessment Pain Assessment: No/denies pain    Home Living Family/patient expects to be discharged to:: Assisted living                  Home Equipment: Rollator (4 wheels);Cane - single point Additional Comments: from [facility] IL[facility]    Prior Function Prior Level of Function : Independent/Modified Independent             Mobility Comments: ambulates with use of a rollator or cane as needed ADLs Comments: independent     Hand Dominance        Extremity/Trunk Assessment   Upper Extremity Assessment Upper Extremity Assessment: Generalized weakness    Lower Extremity Assessment Lower Extremity Assessment: Generalized weakness    Cervical / Trunk Assessment Cervical / Trunk Assessment: Kyphotic  Communication   Communication: HOH  Cognition Arousal/Alertness: Awake/alert Behavior During Therapy: WFL for tasks assessed/performed Overall Cognitive Status: Within Functional Limits for tasks assessed                                 General Comments: cognition not formally assessed but [facility] for general conversation        General Comments      Exercises     Assessment/Plan    PT Assessment Patient needs continued PT services  PT Problem List Decreased strength;Decreased activity tolerance;Decreased balance;Decreased coordination;Decreased mobility;Cardiopulmonary status limiting activity       PT Treatment Interventions DME instruction;Gait training;Stair training;Functional mobility training;Therapeutic activities;Therapeutic exercise;Balance training;Neuromuscular re-education;Patient/family education    PT Goals (Current goals can be found in the Care Plan section)  Acute Rehab PT Goals Patient Stated Goal: feel better PT Goal Formulation: With patient Time For Goal Achievement: 11/26/22 Potential to Achieve Goals: Good    Frequency Min 3X/week     Co-evaluation               AM-PAC PT "6 Clicks" Mobility  Outcome Measure Help needed turning from your back to your side while in a flat bed without using bedrails?: None Help needed moving from lying on your back  to sitting on the side of a flat bed without using bedrails?: None Help needed moving to and from a bed to a chair (including a wheelchair)?: A Little Help needed standing up from a chair using your arms (e.g., wheelchair or bedside chair)?: A Little Help needed to walk in hospital room?: A Little Help needed climbing 3-5 steps with a railing? : A Little 6 Click Score: 20    End of Session   Activity Tolerance: Patient tolerated treatment well Patient left: in bed;with call bell/phone within reach;Other (comment) (NT present) Nurse Communication: Mobility status PT Visit Diagnosis: Other abnormalities of gait and mobility (R26.89)    Time: 5449-2010 PT Time Calculation (min) (ACUTE ONLY): 23 min   Charges:   PT Evaluation $PT Eval Moderate Complexity: 1 Mod PT Treatments $Gait Training: 8-22 mins        Arletta Bale, DPT  Acute Rehabilitation Services Office [phone]   Alessandra Bevels Kalin Kyler 11/12/2022, 10:05 AM

## 2022-11-12 NOTE — Plan of Care (Signed)
Pt alert and oriented x 4. Pt received 1 prn dose of trazodone. Pt had 2 tele event with vtach during overnight. 8 beat and 19 beat run. Raymond Asters NP aware. Mag level ordered and 2.1. No new orders received. Vitals stable.  Problem: Education: Goal: Knowledge of disease or condition will improve Outcome: Progressing Goal: Knowledge of the prescribed therapeutic regimen will improve Outcome: Progressing Goal: Individualized Educational Video(s) Outcome: Progressing   Problem: Activity: Goal: Ability to tolerate increased activity will improve Outcome: Progressing Goal: Will verbalize the importance of balancing activity with adequate rest periods Outcome: Progressing   Problem: Respiratory: Goal: Ability to maintain a clear airway will improve Outcome: Progressing Goal: Levels of oxygenation will improve Outcome: Progressing Goal: Ability to maintain adequate ventilation will improve Outcome: Progressing   Problem: Education: Goal: Knowledge of General Education information will improve Description: Including pain rating scale, medication(s)/side effects and non-pharmacologic comfort measures Outcome: Progressing   Problem: Health Behavior/Discharge Planning: Goal: Ability to manage health-related needs will improve Outcome: Progressing   Problem: Clinical Measurements: Goal: Ability to maintain clinical measurements within normal limits will improve Outcome: Progressing Goal: Will remain free from infection Outcome: Progressing Goal: Diagnostic test results will improve Outcome: Progressing Goal: Respiratory complications will improve Outcome: Progressing Goal: Cardiovascular complication will be avoided Outcome: Progressing   Problem: Activity: Goal: Risk for activity intolerance will decrease Outcome: Progressing   Problem: Nutrition: Goal: Adequate nutrition will be maintained Outcome: Progressing   Problem: Coping: Goal: Level of anxiety will  decrease Outcome: Progressing   Problem: Elimination: Goal: Will not experience complications related to bowel motility Outcome: Progressing Goal: Will not experience complications related to urinary retention Outcome: Progressing   Problem: Pain Managment: Goal: General experience of comfort will improve Outcome: Progressing   Problem: Safety: Goal: Ability to remain free from injury will improve Outcome: Progressing   Problem: Skin Integrity: Goal: Risk for impaired skin integrity will decrease Outcome: Progressing   Problem: Education: Goal: Ability to demonstrate management of disease process will improve Outcome: Progressing Goal: Ability to verbalize understanding of medication therapies will improve Outcome: Progressing Goal: Individualized Educational Video(s) Outcome: Progressing   Problem: Activity: Goal: Capacity to carry out activities will improve Outcome: Progressing   Problem: Cardiac: Goal: Ability to achieve and maintain adequate cardiopulmonary perfusion will improve Outcome: Progressing

## 2022-11-12 NOTE — Progress Notes (Signed)
Progress Note   Patient: Raymond Rios:[id number] DOB: May 10, 1931 DOA: 11/10/2022     2 DOS: the patient was seen and examined on 11/12/2022   Brief hospital course: HPI on admission 11/10/22 --  Raymond Rios is a 89 y.o. male with medical history significant for CAD with late presenting anterior STEMI in 05/2017, HFrEF secondary to ICM, PAF, myasthenia gravis, HTN, HLD, dementia, COPD anxiety, depression, osteoarthritis, and GERD, who presented to the ED for evaluation of dyspnea, cough productive of yellowish sputum, and wheezing for the last couple weeks.  He was diagnosed with COVID 3 weeks ago and was having diminished appetite, followed by generalized weakness, insomnia in addition to cough.  He presented significantly dyspneic with pulse ox 84% on room air, requiring supplemental oxygen.     In the ER - BP was 152/100, RR 24 and spO2 84% on room air, 98% on 2 L/min [place] O2. Labs notable for Cr 1.23, BUN 35, normal BNP 88.9, no leukocytosis (WBC 7.9k). and Hbg 12.5. Imaging -- chest xray with mild interstitial opacities.   CTA chest negative for PE, showed multiple nodular densities in RUL, up to 1 cm some with minimal surround ground glass opacities (infectious vs inflammatory but neoplasm not excluded).  Patient was admitted to the hospital, started on IV antibiotics for pneumonia, IV steroids and nebulizer treatments for acute exacerbation of COPD, and also IV diuresis.  4/6 -- appears euvolemic, no further IV diuresis for now.   Significant wheezing, continue IV steroids. Still requiring oxygen.  Assessment and Plan: * Acute respiratory failure with hypoxia Due primarily to pneumonia and COPD exacerbation, partially heart failure/volume overload. --Mgmt of underlying issues as outlined --Supplement O2, target sat 90-94, wean as tolerated --Check ambulatory O2 sats daily --Incentive spirometer, flutter valve  CAP (community acquired pneumonia) RUL opacities on CTA chest, given clinical  picture at this time favor pneumonia.  Likely driving COPD exaceration. --Continue IV Rocephin and Zithromax. - Scheduled and PRN Mucinex --Mgmt of COPD exacerbation as outlined --Diuresis as needed, see CHF --Repeat CT chest in 3-6 months to ensure resolution of nodular RUL opacities (neoplasm not ruled out)  COPD with acute exacerbation Pt reports remote smoking history, denies formal diagnosis of COPD or asthma.  Has significant wheezing on exam. --Continue IV steroids, transition to Prednisone in 1-2 more days pending improvement --Scheduled DuoNeb, PRN albuterol nebs. - Monitoring his myasthenia gravis on steroids --O2 per protocol  Acute on chronic combined systolic and diastolic CHF (congestive heart failure) Treated initially with IV Lasix, excellent response. Echo in February 2024 with EF of 25% with G1DD. 4/6 - appears euvolemic on exam --Continue PO Lasix 20 mg daily --Strict I/O's & daily weights  Anxiety and depression Continue home Paxil  BPH (benign prostatic hyperplasia) Continue Flomax  Coronary artery disease Stable, chronic. --Continue aspirin, statin and Coreg  Hypothyroidism Continue Synthroid  Dyslipidemia Continue statin   Myasthenia gravis Appears stable.  Monitor. Not on medical therapy per med history.  Essential hypertension Continue Coreg, lisinopril, PO Lasix   PT evaluations - pending for d/c planning.      Subjective: Pt seen awake sitting up in bed this AM. He reports feeling little bit better.  Didn't sleep well due to interruptions.  Feels less short of breath, but not at baseline yet.   Physical Exam: Vitals:   11/12/22 0805 11/12/22 0811 11/12/22 0814 11/12/22 0843  BP:  (!) 144/85    Pulse:  79    Resp:  16    Temp:    97.7 F (36.5 C)  TempSrc:      SpO2: 98% 100% 99%   Weight:      Height:       General exam: awake, appears fatigued, no acute distress HEENT: moist mucus membranes, hearing grossly normal   Respiratory system: bibasilar rhonchi, no expiratory wheezes, on 1 L/min [place] O2. Cardiovascular system: normal S1/S2, RRR, no pedal edema.   Gastrointestinal system: soft, NT, ND, no HSM felt, +bowel sounds. Central nervous system: A&O x3. no gross focal neurologic deficits, normal speech Extremities: moves all, no edema, normal tone Skin: dry, intact, normal temperature Psychiatry: normal mood, congruent affect   Data Reviewed:  Notable labs --- glucose 145, BUN 32, Ca 8.1, Hbg 10.5k stable    Family Communication: son at bedside on rounds  Disposition: Status is: Inpatient Remains inpatient appropriate because: remains on IV therapies pending clinical improvement and weaning off oxygen   Planned Discharge Destination: Home    Time spent: 38 minutes  Author: Pennie Banter, DO 11/12/2022 11:51 AM  For on call review [email or web address].

## 2022-11-12 NOTE — Progress Notes (Signed)
Physical Therapy Treatment Patient Details Name: Raymond Rios MRN: [id number] DOB: 10-Jul-1931 Today's Date: 11/12/2022   History of Present Illness Pt is an 89 y/o male admitted secondary to SOB. Pt found to have acute respiratory failure with hypoxia and CAP. PMH including but not limited to anxiety, depression, osteoarthritis, COPD, coronary artery disease, combined systolic and diastolic CHF, paroxysmal atrial fibrillation, essential hypertension, and GERD.    PT Comments    Pt seen a second time today per RRT and family request. PT reviewed evaluation assessment and data from earlier session with pt and pt's son. PT also demonstrated and instructed pt in appropriate use of the IS and flutter valve. He demonstrated and expressed understanding. Pt would continue to benefit from skilled physical therapy services at this time while admitted and after d/c to address the below listed limitations in order to improve overall safety and independence with functional mobility.   Recommendations for follow up therapy are one component of a multi-disciplinary discharge planning process, led by the attending physician.  Recommendations may be updated based on patient status, additional functional criteria and insurance authorization.  Follow Up Recommendations       Assistance Recommended at Discharge Set up Supervision/Assistance  Patient can return home with the following A little help with walking and/or transfers;A little help with bathing/dressing/bathroom   Equipment Recommendations  None recommended by PT    Recommendations for Other Services       Precautions / Restrictions Precautions Precautions: Fall Precaution Comments: monitor SpO2 Restrictions Weight Bearing Restrictions: No     Mobility  Bed Mobility   Transfers  Ambulation/Gait   Stairs    Wheelchair Mobility    Modified Rankin (Stroke Patients Only)       Balance Overall balance assessment: Needs  assistance Sitting-balance support: Feet supported, No upper extremity supported Sitting balance-Leahy Scale: Good     Standing balance support: During functional activity, Bilateral upper extremity supported, Single extremity supported Standing balance-Leahy Scale: Poor                              Cognition Arousal/Alertness: Awake/alert Behavior During Therapy: WFL for tasks assessed/performed Overall Cognitive Status: Impaired/Different from baseline Area of Impairment: Memory, Following commands                     Memory: Decreased short-term memory Following Commands: Follows one step commands consistently, Follows multi-step commands inconsistently, Follows multi-step commands with increased time, Follows one step commands with increased time              Exercises Other Exercises Other Exercises: PT demonstrated and instructed pt in appropriate use of IS and flutter valve. Pt able to return demonstration with cueing for improved technique. Pt and pt's son instructed on use of flutter valve and IS 3x/day 10 reps at a time.    General Comments        Pertinent Vitals/Pain Pain Assessment Pain Assessment: No/denies pain    Home Living                          Prior Function            PT Goals (current goals can now be found in the care plan section) Acute Rehab PT Goals PT Goal Formulation: With patient Time For Goal Achievement: 11/26/22 Potential to Achieve Goals: Good Progress towards PT goals: Progressing toward goals  Frequency    Min 3X/week      PT Plan Current plan remains appropriate    Co-evaluation              AM-PAC PT "6 Clicks" Mobility   Outcome Measure  Help needed turning from your back to your side while in a flat bed without using bedrails?: None Help needed moving from lying on your back to sitting on the side of a flat bed without using bedrails?: None Help needed moving to and from a  bed to a chair (including a wheelchair)?: A Little Help needed standing up from a chair using your arms (e.g., wheelchair or bedside chair)?: A Little Help needed to walk in hospital room?: A Little Help needed climbing 3-5 steps with a railing? : A Little 6 Click Score: 20    End of Session   Activity Tolerance: Patient tolerated treatment well Patient left: in bed;with call bell/phone within reach;with family/visitor present Nurse Communication: Mobility status PT Visit Diagnosis: Other abnormalities of gait and mobility (R26.89)     Time: 7092-9574 PT Time Calculation (min) (ACUTE ONLY): 13 min  Charges:  $Therapeutic Exercise: 8-22 mins                     Arletta Bale, DPT  Acute Rehabilitation Services Office [phone]    [name] 11/12/2022, 1:20 PM

## 2022-11-13 DIAGNOSIS — J9601 Acute respiratory failure with hypoxia: Secondary | ICD-10-CM | POA: Diagnosis not present

## 2022-11-13 LAB — BASIC METABOLIC PANEL
Anion gap: 7 (ref 5–15)
BUN: 37 mg/dL — ABNORMAL HIGH (ref 8–23)
CO2: 26 mmol/L (ref 22–32)
Calcium: 8 mg/dL — ABNORMAL LOW (ref 8.9–10.3)
Chloride: 107 mmol/L (ref 98–111)
Creatinine, Ser: 1.01 mg/dL (ref 0.61–1.24)
GFR, Estimated: >60 mL/min (ref >60)
Glucose, Bld: 137 mg/dL — ABNORMAL HIGH (ref 70–99)
Potassium: 3.6 mmol/L (ref 3.5–5.1)
Sodium: 140 mmol/L (ref 135–145)

## 2022-11-13 MED ORDER — ALBUTEROL SULFATE HFA 108 (90 BASE) MCG/ACT IN AERS: 2.0000 | INHALATION_SPRAY | Freq: Four times a day (QID) | RESPIRATORY_TRACT | 2 refills | Status: AC | PRN

## 2022-11-13 MED ORDER — GUAIFENESIN ER 600 MG PO TB12: 600.0000 mg | ORAL_TABLET | Freq: Two times a day (BID) | ORAL | 0 refills | Status: AC

## 2022-11-13 MED ORDER — ENSURE ENLIVE PO LIQD: 237.0000 mL | ORAL | 12 refills | Status: AC

## 2022-11-13 MED ORDER — ENSURE ENLIVE PO LIQD
237.0000 mL | ORAL | Status: AC
  Administered 2022-11-13: 237 mL via ORAL

## 2022-11-13 MED ORDER — ACETAMINOPHEN 325 MG PO TABS: 650.0000 mg | ORAL_TABLET | Freq: Four times a day (QID) | ORAL | Status: AC | PRN

## 2022-11-13 MED ORDER — PREDNISONE 10 MG PO TABS: ORAL_TABLET | ORAL | 0 refills | Status: AC

## 2022-11-13 MED ORDER — IPRATROPIUM-ALBUTEROL 0.5-2.5 (3) MG/3ML IN SOLN
3.0000 mL | Freq: Three times a day (TID) | RESPIRATORY_TRACT | Status: DC
  Administered 2022-11-13: 3 mL via RESPIRATORY_TRACT
  Filled 2022-11-13: qty 3

## 2022-11-13 MED ORDER — AMOXICILLIN-POT CLAVULANATE 875-125 MG PO TABS: 1.0000 | ORAL_TABLET | Freq: Two times a day (BID) | ORAL | 0 refills | Status: AC

## 2022-11-13 MED ORDER — LISINOPRIL 2.5 MG PO TABS: 2.5000 mg | ORAL_TABLET | Freq: Every day | ORAL | 2 refills | Status: DC

## 2022-11-13 NOTE — Progress Notes (Signed)
AVS given and reviewed with pt. Medications discussed. All questions answered to satisfaction. Pt verbalized understanding of information given. Pt escorted off the unit with all belongings via wheelchair by this RN. Pt's son, Tammy Sours, at bedside to transport pt to [facility].

## 2022-11-13 NOTE — Progress Notes (Signed)
SATURATION QUALIFICATIONS:   Patient Saturations on Room Air at Rest = 94%  Patient Saturations on Room Air while Ambulating = 92%  Patient Saturations on 0 Liters of oxygen while Ambulating = 92%  Please briefly explain why patient needs home oxygen: N/A

## 2022-11-13 NOTE — TOC Transition Note (Signed)
Transition of Care [facility]) - CM/SW Discharge Note   Patient Details  Name: Raymond Rios MRN: [id number] Date of Birth: [birth year]  Transition of Care [facility]) CM/SW Contact:  Chapman Fitch, RN Phone Number: 11/13/2022, 3:17 PM   Clinical Narrative:     Patient to discharge today Patient does not qualify for O2 at home Son at bedside for transport  Per Rene Kocher at [facility] confirms they already had a therapy orders.  Prior to admission TOC to fax in updated orders once Rene Kocher provides fax number         Patient Goals and CMS Choice      Discharge Placement                         Discharge Plan and Services Additional resources added to the After Visit Summary for                                       Social Determinants of Health (SDOH) Interventions SDOH Screenings   Food Insecurity: No Food Insecurity (11/11/2022)  Housing: Low Risk  (11/11/2022)  Transportation Needs: No Transportation Needs (11/11/2022)  Utilities: Not At Risk (11/11/2022)  Tobacco Use: Medium Risk (11/10/2022)     Readmission Risk Interventions     No data to display

## 2022-11-13 NOTE — Care Management Important Message (Signed)
Important Message  Patient Details  Name: Raymond Rios MRN: [id number] Date of Birth: 08-Jan-1931   Medicare Important Message Given:  Yes     Johnell Comings 11/13/2022, 11:03 AM

## 2022-11-13 NOTE — Plan of Care (Signed)

## 2022-11-13 NOTE — Discharge Summary (Signed)
Physician Discharge Summary   Patient: Raymond Rios MRN: [id number] DOB: Nov 09, 1930  Admit date:     11/10/2022  Discharge date: 11/14/22  Discharge Physician: Pennie BanterKelly A Kanoa Phillippi   PCP: Jaclyn Shaggyate, Denny C, MD   Recommendations at discharge:   Follow up with Primary Care in 1-2 weeks Repeat CBC, BMP, Mg in 1-2 weeks Follow up on recovery from pneumonia and COPD exacerbation Repeat CT chest in 3-6 months to follow RUL nodular opacities for resolution  Discharge Diagnoses: Active Problems:   COPD with acute exacerbation   CAP (community acquired pneumonia)   Acute on chronic combined systolic and diastolic CHF (congestive heart failure)   Essential hypertension   Myasthenia gravis   Dyslipidemia   Hypothyroidism   Coronary artery disease   BPH (benign prostatic hyperplasia)   Anxiety and depression  Principal Problem (Resolved):   Acute respiratory failure with hypoxia  Hospital Course: HPI on admission 11/10/22 --  Raymond Rios is a 89 y.o. male with medical history significant for CAD with late presenting anterior STEMI in 05/2017, HFrEF secondary to ICM, PAF, myasthenia gravis, HTN, HLD, dementia, COPD anxiety, depression, osteoarthritis, and GERD, who presented to the ED for evaluation of dyspnea, cough productive of yellowish sputum, and wheezing for the last couple weeks.  He was diagnosed with COVID 3 weeks ago and was having diminished appetite, followed by generalized weakness, insomnia in addition to cough.  He presented significantly dyspneic with pulse ox 84% on room air, requiring supplemental oxygen.     In the ER - BP was 152/100, RR 24 and spO2 84% on room air, 98% on 2 L/min [place] O2. Labs notable for Cr 1.23, BUN 35, normal BNP 88.9, no leukocytosis (WBC 7.9k). and Hbg 12.5. Imaging -- chest xray with mild interstitial opacities.   CTA chest negative for PE, showed multiple nodular densities in RUL, up to 1 cm some with minimal surround ground glass opacities (infectious vs  inflammatory but neoplasm not excluded).  Patient was admitted to the hospital, started on IV antibiotics for pneumonia, IV steroids and nebulizer treatments for acute exacerbation of COPD, and also IV diuresis.  4/6 -- appears euvolemic, no further IV diuresis for now.   Significant wheezing, continue IV steroids. Still requiring oxygen.  Assessment and Plan: * Acute respiratory failure with hypoxia-resolved as of 11/14/2022 RESOLVED Due primarily to pneumonia and COPD exacerbation, partially heart failure/volume overload. --Mgmt of underlying issues as outlined --Supplement O2, target sat 90-94, wean as tolerated --Check ambulatory O2 sats daily --Incentive spirometer, flutter valve  CAP (community acquired pneumonia) RUL opacities on CTA chest, given clinical picture at this time favor pneumonia.  Likely driving COPD exaceration. --Continue IV Rocephin and Zithromax. - Scheduled and PRN Mucinex --Mgmt of COPD exacerbation as outlined --Diuresis as needed, see CHF --Repeat CT chest in 3-6 months to ensure resolution of nodular RUL opacities (neoplasm not ruled out)  COPD with acute exacerbation Pt reports remote smoking history, denies formal diagnosis of COPD or asthma.  Has significant wheezing on exam. --Treated with IV steroids --Discharge on short Prednisone taper --Scheduled DuoNeb, PRN albuterol nebs. - No issues noted w/myasthenia gravis on steroids --weaned off O2  Acute on chronic combined systolic and diastolic CHF (congestive heart failure) Treated initially with IV Lasix, excellent response. Echo in February 2024 with EF of 25% with G1DD. 4/6 - appears euvolemic on exam --Continue PO Lasix 20 mg daily --Strict I/O's & daily weights  Anxiety and depression Continue home Paxil  BPH (  benign prostatic hyperplasia) Continue Flomax  Coronary artery disease Stable, chronic. --Continue aspirin, statin and Coreg  Hypothyroidism Continue  Synthroid  Dyslipidemia Continue statin   Myasthenia gravis Appears stable.  Monitor. Not on medical therapy per med history.  Essential hypertension Continue Coreg, lisinopril, PO Lasix         Consultants: None Procedures performed: None  Disposition: Home health Diet recommendation:  Discharge Diet Orders (From admission, onward)     Start     Ordered   11/13/22 0000  Diet - low sodium heart healthy        11/13/22 1412           Cardiac diet DISCHARGE MEDICATION: Allergies as of 11/13/2022       Reactions   Other Hives   Sulfonamide Derivatives    REACTION: unspecified        Medication List     TAKE these medications    acetaminophen 325 MG tablet Commonly known as: TYLENOL Take 2 tablets (650 mg total) by mouth every 6 (six) hours as needed for mild pain, fever or headache.   albuterol 108 (90 Base) MCG/ACT inhaler Commonly known as: VENTOLIN HFA Inhale 2 puffs into the lungs every 6 (six) hours as needed for wheezing or shortness of breath.   amoxicillin-clavulanate 875-125 MG tablet Commonly known as: AUGMENTIN Take 1 tablet by mouth 2 (two) times daily for 4 days.   aspirin EC 81 MG tablet Take 1 tablet (81 mg total) by mouth daily. Start once you have finished your plavix.   atorvastatin 40 MG tablet Commonly known as: LIPITOR Take 1 tablet (40 mg total) by mouth daily at 6 PM.   carvedilol 3.125 MG tablet Commonly known as: COREG Take 1 tablet (3.125 mg total) by mouth 2 (two) times daily with a meal.   clobetasol ointment 0.05 % Commonly known as: TEMOVATE Apply 1 Application topically 2 (two) times daily as needed.   feeding supplement Liqd Take 237 mLs by mouth daily.   furosemide 20 MG tablet Commonly known as: LASIX Take 1 tablet (20 mg total) by mouth every other day.   guaiFENesin 600 MG 12 hr tablet Commonly known as: MUCINEX Take 1 tablet (600 mg total) by mouth 2 (two) times daily for 7 days.   levothyroxine 75  MCG tablet Commonly known as: SYNTHROID Take 75 mcg by mouth every morning.   lisinopril 2.5 MG tablet Commonly known as: ZESTRIL Take 1 tablet (2.5 mg total) by mouth daily.   omeprazole 20 MG capsule Commonly known as: PRILOSEC Take 20 mg by mouth daily.   PARoxetine 20 MG tablet Commonly known as: PAXIL Take 20 mg by mouth daily.   predniSONE 10 MG tablet Commonly known as: DELTASONE Take 3 tablets (30 mg total) by mouth daily with breakfast for 1 day, THEN 2 tablets (20 mg total) daily with breakfast for 1 day, THEN 1 tablet (10 mg total) daily with breakfast for 1 day. Start taking on: November 14, 2022   tamsulosin 0.4 MG Caps capsule Commonly known as: FLOMAX Take 0.4 mg by mouth daily.        Discharge Exam: Filed Weights   11/11/22 0500 11/12/22 0500 11/13/22 0500  Weight: 81.8 kg 84.4 kg 82.6 kg   General exam: awake, alert, no acute distress HEENT: atraumatic, clear conjunctiva, anicteric sclera, moist mucus membranes, hearing grossly normal  Respiratory system: CTAB, no wheezes, rales or rhonchi, normal respiratory effort. Cardiovascular system: normal S1/S2, RRR, no pedal edema.  Gastrointestinal system: soft, NT, ND Central nervous system: A&O x3. no gross focal neurologic deficits, normal speech Extremities: moves all , no edema, normal tone Skin: dry, intact, normal temperature Psychiatry: normal mood, congruent affect, judgement and insight appear normal   Condition at discharge: stable  The results of significant diagnostics from this hospitalization (including imaging, microbiology, ancillary and laboratory) are listed below for reference.   Imaging Studies: CT Angio Chest PE W and/or Wo Contrast  Result Date: 11/10/2022 CLINICAL DATA:  High probability for PE. Shortness of breath. COVID 2 weeks ago. EXAM: CT ANGIOGRAPHY CHEST WITH CONTRAST TECHNIQUE: Multidetector CT imaging of the chest was performed using the standard protocol during bolus  administration of intravenous contrast. Multiplanar CT image reconstructions and MIPs were obtained to evaluate the vascular anatomy. RADIATION DOSE REDUCTION: This exam was performed according to the departmental dose-optimization program which includes automated exposure control, adjustment of the mA and/or kV according to patient size and/or use of iterative reconstruction technique. CONTRAST:  9mL OMNIPAQUE IOHEXOL 350 MG/ML SOLN COMPARISON:  None Available. FINDINGS: Cardiovascular: Satisfactory opacification of the pulmonary arteries to the segmental level. No evidence of pulmonary embolism. Normal heart size. No pericardial effusion. There are atherosclerotic calcifications of the aorta. Mediastinum/Nodes: There are enlarged right hilar lymph nodes measuring up to 12 mm short axis. No other enlarged mediastinal or hilar lymph nodes are seen. Visualized esophagus and thyroid gland are within normal limits. There is a small hiatal hernia. Lungs/Pleura: There are multiple ill-defined nodular densities throughout the right upper lobe measuring up to 1 cm image 5/36. Some of these have minimal surrounding ground-glass opacities. There are additional tree-in-bud opacities in the right upper lobe. There are atelectatic changes in the lung bases. There is no pleural effusion or pneumothorax. There is central peribronchial wall thickening bilaterally. Upper Abdomen: Cholecystectomy clips are present. There is an 11 mm left renal cyst. Musculoskeletal: There is mild chronic appearing compression deformity of T12. Review of the MIP images confirms the above findings. IMPRESSION: 1. No evidence for pulmonary embolism. 2. Multiple ill-defined nodular densities in the right upper lobe measuring up to 1 cm. Some of these have minimal surrounding ground-glass opacities. Tree-in-bud opacities in the right upper lobe. Findings are favored as infectious/inflammatory. Neoplasm can not be excluded. Follow-up chest CT recommended  in 3-6 months. 3. Right hilar lymphadenopathy, likely reactive. Aortic Atherosclerosis (ICD10-I70.0). Electronically Signed   By: Darliss Cheney M.D.   On: 11/10/2022 18:53   DG Chest 2 View  Result Date: 11/10/2022 CLINICAL DATA:  Shortness of breath EXAM: CHEST - 2 VIEW COMPARISON:  Chest x-ray dated June 17, 2022 FINDINGS: Cardiac and mediastinal contours are unchanged. Mild diffuse interstitial opacities. No pleural effusion or pneumothorax. IMPRESSION: Mild interstitial opacities, possibly due to pulmonary edema. Electronically Signed   By: Allegra Lai M.D.   On: 11/10/2022 18:09    Microbiology: Results for orders placed or performed during the hospital encounter of 06/17/22  Urine Culture     Status: Abnormal   Collection Time: 06/17/22 10:00 AM   Specimen: Urine, Clean Catch  Result Value Ref Range Status   Specimen Description URINE, CLEAN CATCH  Final   Special Requests   Final    NONE Performed at [facility], [address], [place], [place] [address]    Culture MULTIPLE SPECIES PRESENT, SUGGEST RECOLLECTION (A)  Final   Report Status 06/18/2022 FINAL  Final    Labs: CBC: Recent Labs  Lab 11/10/22 1750 11/11/22 0444 11/12/22  0440  WBC 7.9 3.0* 7.0  HGB 12.5* 10.9* 10.5*  HCT 39.7 35.1* 32.9*  MCV 87.6 86.0 85.7  PLT 165 146* 150   Basic Metabolic Panel: Recent Labs  Lab 11/10/22 1750 11/11/22 0444 11/12/22 0052 11/12/22 0440 11/13/22 0708  NA 137 139  --  139 140  K 3.6 3.9  --  3.7 3.6  CL 100 102  --  104 107  CO2 25 27  --  26 26  GLUCOSE 119* 166*  --  145* 137*  BUN 35* 32*  --  36* 37*  CREATININE 1.23 1.05  --  1.19 1.01  CALCIUM 8.8* 8.1*  --  8.1* 8.0*  MG  --   --  2.1  --   --    Liver Function Tests: No results for input(s): "AST", "ALT", "ALKPHOS", "BILITOT", "PROT", "ALBUMIN" in the last 168 hours. CBG: No results for input(s): "GLUCAP" in the last 168 hours.  Discharge time spent: greater than 30  minutes.  Signed: Pennie Banter, DO [facility] 11/14/2022

## 2022-11-14 ENCOUNTER — Encounter: Payer: Self-pay | Admitting: Family Medicine

## 2022-11-14 NOTE — Assessment & Plan Note (Signed)
RESOLVED Due primarily to pneumonia and COPD exacerbation, partially heart failure/volume overload. --Mgmt of underlying issues as outlined --Supplement O2, target sat 90-94, wean as tolerated --Check ambulatory O2 sats daily --Incentive spirometer, flutter valve

## 2022-11-15 ENCOUNTER — Telehealth: Payer: Self-pay | Admitting: Internal Medicine

## 2022-11-15 DIAGNOSIS — Z79899 Other long term (current) drug therapy: Secondary | ICD-10-CM

## 2022-11-15 NOTE — Telephone Encounter (Signed)
Pt c/o medication issue:  1. Name of Medication:   lisinopril (ZESTRIL) 2.5 MG tablet  guaiFENesin (MUCINEX) 600 MG 12 hr tablet   2. How are you currently taking this medication (dosage and times per day)?   As prescribed  3. Are you having a reaction (difficulty breathing--STAT)?   4. What is your medication issue?   Son stated patient has been put on these medications in the hospital and wants to follow-up with cardiology to confirm this medication change is OK.

## 2022-11-15 NOTE — Telephone Encounter (Signed)
Patient son Tammy Sours called and stated he wanted to make sure you were aware and ok with the changes to patient medications.  I did inform him that the mucinex is not long term and he is supposed to only take for 7 days for congestion and cough.  His lisinopril will be something he takes long terms for his high blood pressure. He would like for you to go over ED notes to see of you agree with adding the lisinopril to his medications.

## 2022-11-16 NOTE — Telephone Encounter (Signed)
I am fine with adding back lisinopril 2.5 mg daily, though Raymond Rios and his family should monitor for recurrent lightheadedness and also keep track of his blood pressures in the setting of prior orthostatic hypotension.  Given that his blood pressures had been running high recently, I am hopeful that he will not have any low blood pressures or lightheadedness.  He should have a basic metabolic panel in about 2 weeks after starting lisinopril to ensure that his kidney function and potassium remain stable.  Yvonne Kendall, MD [facility]

## 2022-11-16 NOTE — Telephone Encounter (Signed)
Left a message for the patient to call back.  

## 2022-11-16 NOTE — Telephone Encounter (Signed)
Pt's son made aware of MD's recommendations and verbalized understanding BMP order placed

## 2022-12-28 ENCOUNTER — Other Ambulatory Visit
Admission: RE | Admit: 2022-12-28 | Discharge: 2022-12-28 | Disposition: A | Discharge status: Home / Self Care | Payer: Medicare HMO | Source: Ambulatory Visit | Attending: Internal Medicine | Admitting: Internal Medicine

## 2022-12-28 ENCOUNTER — Encounter: Payer: Self-pay | Admitting: Internal Medicine

## 2022-12-28 ENCOUNTER — Ambulatory Visit: Payer: Medicare HMO | Attending: Internal Medicine | Admitting: Internal Medicine

## 2022-12-28 VITALS — BP 122/64 | HR 66 | Ht 66.0 in | Wt 188.6 lb

## 2022-12-28 DIAGNOSIS — E785 Hyperlipidemia, unspecified: Secondary | ICD-10-CM | POA: Diagnosis not present

## 2022-12-28 DIAGNOSIS — D692 Other nonthrombocytopenic purpura: Secondary | ICD-10-CM | POA: Diagnosis not present

## 2022-12-28 DIAGNOSIS — I2510 Atherosclerotic heart disease of native coronary artery without angina pectoris: Secondary | ICD-10-CM | POA: Diagnosis not present

## 2022-12-28 DIAGNOSIS — I5022 Chronic systolic (congestive) heart failure: Secondary | ICD-10-CM | POA: Diagnosis not present

## 2022-12-28 LAB — BASIC METABOLIC PANEL
Anion gap: 7 (ref 5–15)
BUN: 24 mg/dL — ABNORMAL HIGH (ref 8–23)
CO2: 28 mmol/L (ref 22–32)
Calcium: 8.8 mg/dL — ABNORMAL LOW (ref 8.9–10.3)
Chloride: 104 mmol/L (ref 98–111)
Creatinine, Ser: 0.98 mg/dL (ref 0.61–1.24)
GFR, Estimated: >60 mL/min (ref >60)
Glucose, Bld: 94 mg/dL (ref 70–99)
Potassium: 4.3 mmol/L (ref 3.5–5.1)
Sodium: 139 mmol/L (ref 135–145)

## 2022-12-28 MED ORDER — LISINOPRIL 2.5 MG PO TABS: 2.5000 mg | ORAL_TABLET | Freq: Every day | ORAL | 3 refills | Status: DC

## 2022-12-28 NOTE — Progress Notes (Signed)
Follow-up Outpatient Visit Date: 12/28/2022  Primary Care Provider: Jaclyn Shaggy, MD [address]   [place] [address]  Chief Complaint: Follow-up heart failure and coronary artery disease with recent hospitalization  HPI:  [name] is a 89 y.o. male with history of  CAD with late presenting anterior STEMI managed medically (05/2017), chronic HFrEF due to ischemic cardiomyopathy, paroxysmal atrial fibrillation, HTN, HLD, COPD, and myasthenia gravis, who presents for follow-up of coronary artery disease and HFrEF.  He was last seen in our office in February by Eula Listen, PA, at which time he was doing well from a heart standpoint, though 7 pound weight gain since November was noted.  He was encouraged to take furosemide 20 mg daily for 3 days followed by 20 mg every other day.  He was hospitalized last month with worsening shortness of breath, productive cough, and wheezing felt to be primarily due to COPD exacerbation and pneumonia preceded by COVID-19 infection, though an element of acute on chronic HFrEF was also suspected.  He was treated with antibiotics, corticosteroids, and furosemide.  Today, [name] reports that he feels back to baseline.  He denies shortness of breath, chest pain, palpitations, lightheadedness, and edema.  He sometimes staggers a bit when he is walking but has not fallen.  He uses a walker for stability.  His son notes that [name] was started on lisinopril during his recent hospitalization and wonders if this should be continued.  He notes easy bruising on his arms and wonders if his medications could be contributing to this.  --------------------------------------------------------------------------------------------------  Past Medical History:  Diagnosis Date   Acute respiratory failure with hypoxia (HCC) 11/10/2022   ALLERGY 11/08/2007   Qualifier: Diagnosis of  By: Hetty Ely MD, Franne Grip    Anemia    ANXIETY 08/07/1946   Qualifier: Diagnosis of   By: Hetty Ely MD, Franne Grip    Arthritis    Chronic systolic heart failure (HCC)    COPD 08/07/1988   Qualifier: Diagnosis of  By: Hetty Ely MD, Franne Grip    Coronary artery disease involving native coronary artery of native heart without angina pectoris 12/06/2017   Cramp in limb 10/30/2013   Depression    ECZEMA 10/14/2008   Qualifier: Diagnosis of  By: Hetty Ely MD, Franne Grip    Essential hypertension 12/05/2001   Qualifier: Diagnosis of  By: Hetty Ely MD, Franne Grip    GERD 10/14/2008   Qualifier: Diagnosis of  By: Hetty Ely MD, Franne Grip    Hesitancy 11/19/2013   History of chickenpox    History of colon polyps    HTN (hypertension) 10/30/2013   HYPERLIPIDEMIA 08/07/1993   Qualifier: Diagnosis of  By: Hetty Ely MD, Franne Grip    Hyperlipidemia LDL goal <70 10/30/2013   Ischemic cardiomyopathy    a. late presenting anterior STEMI 05/29/2017, TTE 05/30/17: EF 30-35%, AK of the mid-apicalanteroseptal, anterior, antlat, & apical wall, Gr1DD; c. 08/2017 Echo: EF 30-35%, mid-apicalanteroseptal, ant, apical AK, Gr1 DD.   Myasthenia gravis (HCC)    Need for shingles vaccine 02/16/2014   Palpitations 07/02/2020   Paroxysmal atrial fibrillation (HCC) 12/06/2017   Peyronie's disease 11/15/2006   Qualifier: Diagnosis of  By: Hetty Ely MD, Franne Grip    RECTAL BLEEDING, HX OF 11/15/2006   Qualifier: Diagnosis of  By: Hetty Ely MD, Franne Grip    Routine general medical examination at a health care facility 11/19/2013   Screening for prostate cancer 11/19/2013   SHOULDER PAIN, LEFT, CHRONIC 12/05/2006  Qualifier: Diagnosis of  By: Hetty Ely MD, Franne Grip    Tremor 05/16/2018   Urine incontinence    VITAMIN D DEFICIENCY 04/29/2009   Qualifier: Diagnosis of  By: Hetty Ely MD, Franne Grip    Past Surgical History:  Procedure Laterality Date   CARDIAC CATHETERIZATION     CHOLECYSTECTOMY     LEFT HEART CATH AND CORONARY ANGIOGRAPHY N/A 05/29/2017   Procedure: LEFT HEART CATH AND  CORONARY ANGIOGRAPHY;  Surgeon: Yvonne Kendall, MD;  Location: [facility] INVASIVE [facility];  Service: Cardiovascular;  Laterality: N/A;   TONSILLECTOMY AND ADENOIDECTOMY      Current Meds  Medication Sig   acetaminophen (TYLENOL) 325 MG tablet Take 2 tablets (650 mg total) by mouth every 6 (six) hours as needed for mild pain, fever or headache.   albuterol (VENTOLIN HFA) 108 (90 Base) MCG/ACT inhaler Inhale 2 puffs into the lungs every 6 (six) hours as needed for wheezing or shortness of breath.   aspirin EC 81 MG tablet Take 1 tablet (81 mg total) by mouth daily. Start once you have finished your plavix.   atorvastatin (LIPITOR) 40 MG tablet Take 1 tablet (40 mg total) by mouth daily at 6 PM.   carvedilol (COREG) 3.125 MG tablet Take 1 tablet (3.125 mg total) by mouth 2 (two) times daily with a meal.   clobetasol ointment (TEMOVATE) 0.05 % Apply 1 Application topically 2 (two) times daily as needed.   feeding supplement (ENSURE ENLIVE / ENSURE PLUS) LIQD Take 237 mLs by mouth daily.   furosemide (LASIX) 20 MG tablet Take 1 tablet (20 mg total) by mouth every other day.   levothyroxine (SYNTHROID, LEVOTHROID) 75 MCG tablet Take 75 mcg by mouth every morning.   lisinopril (ZESTRIL) 2.5 MG tablet Take 1 tablet (2.5 mg total) by mouth daily.   omeprazole (PRILOSEC) 20 MG capsule Take 20 mg by mouth daily.   PARoxetine (PAXIL) 20 MG tablet Take 20 mg by mouth daily.   tamsulosin (FLOMAX) 0.4 MG CAPS capsule Take 0.4 mg by mouth daily.    Allergies: Other and Sulfonamide derivatives  Social History   Tobacco Use   Smoking status: Former   Smokeless tobacco: Former   Tobacco comments:    quit around 1970's  Vaping Use   Vaping Use: Never used  Substance Use Topics   Alcohol use: No   Drug use: No    Family History  Problem Relation Age of Onset   Hyperlipidemia Mother    Arthritis Father    Hyperlipidemia Father     Review of Systems: A 12-system review of systems was performed and  was negative except as noted in the HPI.  --------------------------------------------------------------------------------------------------  Physical Exam: BP 122/64 (BP Location: Left Arm, Patient Position: Sitting, Cuff Size: Normal)   Pulse 66   Ht 5\' 6"  (1.676 m)   Wt 188 lb 9.6 oz (85.5 kg)   SpO2 98%   BMI 30.44 kg/m   General:  NAD. Neck: No JVD or HJR. Lungs: Clear to auscultation bilaterally without wheezes or crackles. Heart: Regular rate and rhythm without murmurs, rubs, or gallops. Abdomen: Soft, nontender, nondistended. Extremities: 1+ left and trace right pretibial edema.  EKG: Normal sinus rhythm with septal infarct and lateral T wave inversions.  No significant change from prior tracing on 09/27/2022.  Lab Results  Component Value Date   WBC 7.0 11/12/2022   HGB 10.5 (L) 11/12/2022   HCT 32.9 (L) 11/12/2022   MCV 85.7 11/12/2022   PLT 150 11/12/2022  Lab Results  Component Value Date   NA 140 11/13/2022   K 3.6 11/13/2022   CL 107 11/13/2022   CO2 26 11/13/2022   BUN 37 (H) 11/13/2022   CREATININE 1.01 11/13/2022   GLUCOSE 137 (H) 11/13/2022   ALT 23 06/17/2022    Lab Results  Component Value Date   CHOL 89 05/30/2017   HDL 46 05/30/2017   LDLCALC 34 05/30/2017   LDLDIRECT 190.3 08/23/2009   TRIG 44 05/30/2017   CHOLHDL 1.9 05/30/2017    --------------------------------------------------------------------------------------------------  ASSESSMENT AND PLAN: Coronary artery disease without angina: No angina reported in the setting of known occluded LAD that was managed in the setting of late presenting STEMI.  Continue aspirin and atorvastatin for secondary prevention.  Continue lipid monitoring through Dr. Maree Krabbe office.  Chronic HFrEF: Mr. Ochsner has stable chronic leg edema that he reports is at his baseline.  Recent hospitalization with acute respiratory failure was primarily driven by COPD exacerbation in the setting of pneumonia and  preceding COVID-19 infection.  However, an element of HFpEF was likely contributing.  We will continue furosemide 20 mg every other day for now, though Mr. Stolley was instructed to increase this to daily dosing if he has further weight gain or swelling.  We will continue low-dose carvedilol and lisinopril, with BMP today to ensure stable renal function and electrolytes given recent addition of lisinopril.  Hyperlipidemia: Continue atorvastatin 40 mg daily for target LDL less than 70.  Labs routinely followed by Dr. Arlana Pouch.  Senile purpura: Bruising noted on right arm, likely due to skin thinning and antiplatelet therapy with aspirin.  We discussed the risks and benefits of continued aspirin therapy in the setting of known CAD and have agreed to continue this.  Follow-up: Return to clinic in 6 months.  Yvonne Kendall, MD 12/28/2022 11:15 AM

## 2022-12-28 NOTE — Patient Instructions (Signed)
Medication Instructions:  Your Physician recommend you continue on your current medication as directed.    *If you need a refill on your cardiac medications before your next appointment, please call your pharmacy*   Lab Work: Your provider would like for you to have following labs drawn: (BMP).   Please go to the [facility] entrance and check in at the front desk.  You do not need an appointment.  They are open from 7am-6 pm.     Testing/Procedures: None ordered today   Follow-Up: At [facility], you and your health needs are our priority.  As part of our continuing mission to provide you with exceptional heart care, we have created designated Provider Care Teams.  These Care Teams include your primary Cardiologist (physician) and Advanced Practice Providers (APPs -  Physician Assistants and Nurse Practitioners) who all work together to provide you with the care you need, when you need it.  We recommend signing up for the patient portal called "MyChart".  Sign up information is provided on this After Visit Summary.  MyChart is used to connect with patients for Virtual Visits (Telemedicine).  Patients are able to view lab/test results, encounter notes, upcoming appointments, etc.  Non-urgent messages can be sent to your provider as well.   To learn more about what you can do with MyChart, go to [email or web address].    Your next appointment:   6 month(s)  Provider:   You may see Christopher End, MD or one of the following Advanced Practice Providers on your designated Care Team:   Christopher Berge, NP Ryan Dunn, PA-C Cadence Furth, PA-C Sheri Hammock, NP     

## 2023-01-18 ENCOUNTER — Other Ambulatory Visit: Payer: Self-pay | Admitting: Physician Assistant

## 2023-01-18 DIAGNOSIS — E785 Hyperlipidemia, unspecified: Secondary | ICD-10-CM

## 2023-01-29 ENCOUNTER — Other Ambulatory Visit: Payer: Self-pay | Admitting: Physician Assistant

## 2023-02-02 ENCOUNTER — Encounter: Payer: Self-pay | Admitting: Podiatry

## 2023-02-02 ENCOUNTER — Ambulatory Visit: Payer: Medicare HMO | Admitting: Podiatry

## 2023-02-02 VITALS — BP 150/72 | HR 69

## 2023-02-02 DIAGNOSIS — L600 Ingrowing nail: Secondary | ICD-10-CM

## 2023-02-02 DIAGNOSIS — M79675 Pain in left toe(s): Secondary | ICD-10-CM | POA: Diagnosis not present

## 2023-02-02 DIAGNOSIS — M79674 Pain in right toe(s): Secondary | ICD-10-CM | POA: Diagnosis not present

## 2023-02-02 DIAGNOSIS — B351 Tinea unguium: Secondary | ICD-10-CM

## 2023-02-02 NOTE — Patient Instructions (Signed)
Apply triple antibiotic ointment to left great toe once daily for one week. Call office if you have any problems.  Ingrown Toenail  An ingrown toenail occurs when the corner or sides of a toenail grow into the surrounding skin. This causes discomfort and pain. The big toe is most commonly affected, but any of the toes can be affected. If an ingrown toenail is not treated, it can become infected. What are the causes? This condition may be caused by: Wearing shoes that are too small or tight. An injury, such as stubbing your toe or having your toe stepped on. Improper cutting or care of your toenails. Having nail or foot abnormalities that were present from birth (congenital abnormalities), such as having a nail that is too big for your toe. What increases the risk? The following factors may make you more likely to develop ingrown toenails: Age. Nails tend to get thicker with age, so ingrown nails are more common among older people. Cutting your toenails incorrectly, such as cutting them very short or cutting them unevenly. An ingrown toenail is more likely to get infected if you have: Diabetes. Blood flow (circulation) problems. What are the signs or symptoms? Symptoms of an ingrown toenail may include: Pain, soreness, or tenderness. Redness. Swelling. Hardening of the skin that surrounds the toenail. Signs that an ingrown toenail may be infected include: Fluid or pus. Symptoms that get worse. How is this diagnosed? Ingrown toenails may be diagnosed based on: Your symptoms and medical history. A physical exam. Labs or tests. If you have fluid or blood coming from your toenail, a sample may be collected to test for the specific type of bacteria that is causing the infection. How is this treated? Treatment depends on the severity of your symptoms. You may be able to care for your toenail at home. If you have an infection, you may be prescribed antibiotic medicines. If you have fluid  or pus draining from your toenail, your health care provider may drain it. If you have trouble walking, you may be given crutches to use. If you have a severe or infected ingrown toenail, you may need a procedure to remove part or all of the nail. Follow these instructions at home: Foot care  Check your wound every day for signs of infection, or as often as told by your health care provider. Check for: More redness, swelling, or pain. More fluid or blood. Warmth. Pus or a bad smell. Do not pick at your toenail or try to remove it yourself. Soak your foot in warm, soapy water. Do this for 20 minutes, 3 times a day, or as often as told by your health care provider. This helps to keep your toe clean and your skin soft. Wear shoes that fit well and are not too tight. Your health care provider may recommend that you wear open-toed shoes while you heal. Trim your toenails regularly and carefully. Cut your toenails straight across to prevent injury to the skin at the corners of the toenail. Do not cut your nails in a curved shape. Keep your feet clean and dry to help prevent infection. General instructions Take over-the-counter and prescription medicines only as told by your health care provider. If you were prescribed an antibiotic, take it as told by your health care provider. Do not stop taking the antibiotic even if you start to feel better. If your health care provider told you to use crutches to help you move around, use them as instructed. Return to  your normal activities as told by your health care provider. Ask your health care provider what activities are safe for you. Keep all follow-up visits. This is important. Contact a health care provider if: You have more redness, swelling, pain, or other symptoms that do not improve with treatment. You have fluid, blood, or pus coming from your toenail. You have a red streak on your skin that starts at your foot and spreads up your leg. You have  a fever. Summary An ingrown toenail occurs when the corner or sides of a toenail grow into the surrounding skin. This causes discomfort and pain. The big toe is most commonly affected, but any of the toes can be affected. If an ingrown toenail is not treated, it can become infected. Fluid or pus draining from your toenail is a sign of infection. Your health care provider may need to drain it. You may be given antibiotics to treat the infection. Trimming your toenails regularly and properly can help you prevent an ingrown toenail. This information is not intended to replace advice given to you by your health care provider. Make sure you discuss any questions you have with your health care provider. Document Revised: 11/23/2020 Document Reviewed: 11/23/2020 Elsevier Patient Education  2024 [employer].

## 2023-02-02 NOTE — Progress Notes (Unsigned)
  Subjective:  Patient ID: Raymond Rios, male    DOB: 11-08-1930,  MRN: [id number]  Raymond Rios presents to clinic today for: {jgcomplaint:23593}  Chief Complaint  Patient presents with   Nail Problem    "I have a couple of ingrown toenails."    PCP is Jaclyn Shaggy, MD.  Allergies  Allergen Reactions   Other Hives   Sulfonamide Derivatives     REACTION: unspecified    Review of Systems: Negative except as noted in the HPI.  Objective: No changes noted in today's physical examination. Vitals:   02/02/23 0915  BP: (!) 150/72  Pulse: 69    Monti D Robidoux is a pleasant 89 y.o. male in NAD. AAO x 3.  Vascular Examination: Capillary refill time <3 seconds b/l LE. Palpable pedal are faintly pulses b/l LE. Digital hair present b/l. No pedal edema b/l. Skin temperature gradient WNL b/l. No varicosities b/l. {jgvascular:23595}.  Dermatological Examination: Pedal skin with normal turgor, texture and tone b/l. No open wounds. No interdigital macerations b/l. Toenails 1-5 b/l thickened, discolored, dystrophic with subungual debris. There is pain on palpation to dorsal aspect of nailplates. {jgderm:23598}.  Neurological Examination: Protective sensation intact with 10 gram monofilament b/l LE. Vibratory sensation intact b/l LE. {jgneuro:23601::"Protective sensation intact 5/5 intact bilaterally with 10g monofilament b/l.","Vibratory sensation intact b/l.","Proprioception intact bilaterally."}  Musculoskeletal Examination: {jgmsk:23600}  Xray findings {jgPodToeLocator:23637}: {jgxrayfindings:23683}       No data to display           Assessment/Plan: No diagnosis found.  No orders of the defined types were placed in this encounter.  None {Jgplan:23602::"-Patient/POA to call should there be question/concern in the interim."}   Return in about 3 months (around 05/05/2023).  Freddie Breech, DPM

## 2023-04-02 ENCOUNTER — Encounter: Payer: Self-pay | Admitting: Internal Medicine

## 2023-04-03 ENCOUNTER — Other Ambulatory Visit: Payer: Self-pay | Admitting: Internal Medicine

## 2023-04-03 DIAGNOSIS — R911 Solitary pulmonary nodule: Secondary | ICD-10-CM

## 2023-04-17 ENCOUNTER — Ambulatory Visit: Payer: Medicare HMO

## 2023-05-11 ENCOUNTER — Ambulatory Visit: Payer: Medicare HMO | Admitting: Podiatry

## 2023-05-17 ENCOUNTER — Ambulatory Visit: Payer: Medicare HMO | Admitting: Podiatry

## 2023-05-17 DIAGNOSIS — B351 Tinea unguium: Secondary | ICD-10-CM

## 2023-05-17 DIAGNOSIS — M79675 Pain in left toe(s): Secondary | ICD-10-CM | POA: Diagnosis not present

## 2023-05-17 DIAGNOSIS — M79674 Pain in right toe(s): Secondary | ICD-10-CM | POA: Diagnosis not present

## 2023-05-17 NOTE — Progress Notes (Signed)
Subjective:  Patient ID: Raymond Rios, male    DOB: 1931/04/14,  MRN: [id number]  Raymond Rios presents to clinic today for painful elongated mycotic toenails 1-5 bilaterally which are tender when wearing enclosed Hall gear. Pain is relieved with periodic professional debridement.  Chief Complaint  Patient presents with   RFC    Periotic swelling in left foot    New problem(s): None.   PCP is Jaclyn Shaggy, MD.  Allergies  Allergen Reactions   Other Hives   Sulfonamide Derivatives     REACTION: unspecified    Review of Systems: Negative except as noted in the HPI.  Objective: No changes noted in today's physical examination. There were no vitals filed for this visit. Raymond Rios is a pleasant 89 y.o. male in NAD. AAO x 3.  Vascular Examination: CFT <3 seconds b/l. DP/PT pulses faintly palpable b/l. Skin temperature gradient warm to warm b/l. No pain with calf compression. No ischemia or gangrene. No cyanosis or clubbing noted b/l. No edema noted b/l LE.   Neurological Examination: Sensation grossly intact b/l with 10 gram monofilament. Vibratory sensation intact b/l.   Dermatological Examination: Pedal skin warm and supple b/l.   No open wounds. No interdigital macerations.  Toenails 1-5 b/l thick, discolored, elongated with subungual debris and pain on dorsal palpation.    No corns, calluses nor porokeratotic lesions noted.  Musculoskeletal Examination: Muscle strength 5/5 to all lower extremity muscle groups bilaterally. No pain, crepitus or joint limitation noted with ROM bilateral LE. No gross bony deformities bilaterally.  Radiographs: None  Assessment/Plan: 1. Pain due to onychomycosis of toenails of both feet     -Consent given for treatment as described below: -Examined patient. -Continue supportive Gerhart gear daily. -Mycotic toenails 1-5 bilaterally were debrided in length and girth with sterile nail nippers and dremel without incident. -Patient/POA to call  should there be question/concern in the interim.   Return in about 3 months (around 08/17/2023).  Freddie Breech, DPM

## 2023-05-22 ENCOUNTER — Encounter: Payer: Self-pay | Admitting: Podiatry

## 2023-07-11 ENCOUNTER — Ambulatory Visit: Payer: Medicare HMO | Attending: Internal Medicine | Admitting: Internal Medicine

## 2023-07-11 ENCOUNTER — Encounter: Payer: Self-pay | Admitting: Internal Medicine

## 2023-07-11 VITALS — BP 130/62 | HR 69 | Ht 66.0 in | Wt 196.8 lb

## 2023-07-11 DIAGNOSIS — I2510 Atherosclerotic heart disease of native coronary artery without angina pectoris: Secondary | ICD-10-CM | POA: Diagnosis not present

## 2023-07-11 DIAGNOSIS — I5022 Chronic systolic (congestive) heart failure: Secondary | ICD-10-CM | POA: Diagnosis not present

## 2023-07-11 DIAGNOSIS — E785 Hyperlipidemia, unspecified: Secondary | ICD-10-CM

## 2023-07-11 DIAGNOSIS — R062 Wheezing: Secondary | ICD-10-CM | POA: Diagnosis not present

## 2023-07-11 MED ORDER — FUROSEMIDE 20 MG PO TABS: 20.0000 mg | ORAL_TABLET | Freq: Every day | ORAL | 3 refills | Status: DC

## 2023-07-11 NOTE — Progress Notes (Signed)
Cardiology Office Note:  .   Date:  07/11/2023  ID:  Raymond Rios, DOB Dec 09, 1930, MRN [id number] PCP: Jaclyn Shaggy, MD  [facility] Providers Cardiologist:  Yvonne Kendall, MD     History of Present Illness: .   Raymond Rios is a 89 y.o. male with history of  CAD with late presenting anterior STEMI managed medically (05/2017), chronic HFrEF due to ischemic cardiomyopathy, paroxysmal atrial fibrillation, HTN, HLD, COPD, and myasthenia gravis, who presents for follow-up of coronary artery disease and chronic HFrEF.  I last saw him in May, at which time he was feeling fairly well without chest pain, shortness of breath, and palpitations.  We did not make any medication changes or pursue additional testing.  Today, Raymond Rios reports that he has been feeling fairly well, denying chest pain, shortness of breath, palpitations, and lightheadedness.  He has some chronic ankle edema that seems a little bit worse than baseline.  His weight is up 8 pounds since May.  His son notes that Raymond Rios was also had frequent wheezing following COVID-19 and pneumonia in May.  He was prescribed an inhaler by Dr. Arlana Pouch, which he uses intermittently with some improvement.  He has not seen a pulmonologist.  ROS: See HPI  Studies Reviewed: Marland Kitchen   EKG Interpretation Date/Time:  Wednesday July 11 2023 11:22:23 EST Ventricular Rate:  69 PR Interval:  154 QRS Duration:  90 QT Interval:  414 QTC Calculation: 443 R Axis:   -36  Text Interpretation: Sinus rhythm with occasional Premature ventricular complexes Left axis deviation Minimal voltage criteria for LVH, may be normal variant ( R in aVL ) Septal infarct (cited on or before 10-Nov-2022) T wave abnormality, consider lateral ischemia Abnormal ECG When compared with ECG of 28-Dec-2022 Premature ventricular complexes are now Present Otherwise no significant change Confirmed by Raymond Rios, Raymond Rios [phone]) on 07/11/2023 3:11:00 PM    Risk Assessment/Calculations:              Physical Exam:   VS:  BP 130/62 (BP Location: Left Arm, Patient Position: Sitting, Cuff Size: Normal)   Pulse 69   Ht 5\' 6"  (1.676 m)   Wt 196 lb 12.8 oz (89.3 kg)   SpO2 98%   BMI 31.76 kg/m    Wt Readings from Last 3 Encounters:  07/11/23 196 lb 12.8 oz (89.3 kg)  12/28/22 188 lb 9.6 oz (85.5 kg)  11/13/22 182 lb 1.6 oz (82.6 kg)    General:  NAD. Neck: No JVD or HJR. Lungs: Diffuse expiratory wheezes.  Good air movement.  No crackles appreciated. Heart: Regular rate and rhythm without murmurs, rubs, or gallops. Abdomen: Soft, nontender, nondistended. Extremities: 1+ bilateral lower extremity edema (R>L)  ASSESSMENT AND PLAN: .    Coronary artery disease: No angina reported.  Continue aspirin, atorvastatin, and carvedilol for secondary prevention.  Chronic HFrEF: Raymond Rios has mild lower extremity edema, which is chronic but slightly worse than at prior visits.  I have encouraged him to increase furosemide to 20 mg daily (currently taking every other day).  We discussed escalation of his GDMT, currently consisting of low-dose carvedilol and lisinopril, but will defer this to minimize risk for lightheadedness and falls.  Wheezing: This began after COVID-19 infection and subsequent pneumonia in May.  Though he has notable wheezing on exam today, he does not seem to be bothered by it.  I encouraged him to speak with Dr. Arlana Pouch to see if other therapies are needed beyond his  as needed albuterol.  I also offered a pulmonology referral, which Raymond Rios did not wish to pursue at this time.  Hyperlipidemia: Continue atorvastatin 40 mg daily for target LDL less than 70.    Dispo: Return to clinic in 6 months.  Signed, Yvonne Kendall, MD

## 2023-07-11 NOTE — Patient Instructions (Signed)
Medication Instructions:  Your physician recommends the following medication changes.  INCREASE: Lasix to 20 mg by mouth daily    *If you need a refill on your cardiac medications before your next appointment, please call your pharmacy*   Lab Work: No labs ordered today .   Testing/Procedures: No test ordered today    Follow-Up: At [facility], you and your health needs are our priority.  As part of our continuing mission to provide you with exceptional heart care, we have created designated Provider Care Teams.  These Care Teams include your primary Cardiologist (physician) and Advanced Practice Providers (APPs -  Physician Assistants and Nurse Practitioners) who all work together to provide you with the care you need, when you need it.  We recommend signing up for the patient portal called "MyChart".  Sign up information is provided on this After Visit Summary.  MyChart is used to connect with patients for Virtual Visits (Telemedicine).  Patients are able to view lab/test results, encounter notes, upcoming appointments, etc.  Non-urgent messages can be sent to your provider as well.   To learn more about what you can do with MyChart, go to [email or web address].    Your next appointment:   6 month(s)  Provider:   You may see Yvonne Kendall, MD or one of the following Advanced Practice Providers on your designated Care Team:   Nicolasa Ducking, NP Eula Listen, PA-C Cadence Fransico Michael, PA-C Charlsie Quest, NP Carlos Levering, NP

## 2023-08-17 ENCOUNTER — Ambulatory Visit (INDEPENDENT_AMBULATORY_CARE_PROVIDER_SITE_OTHER): Payer: Medicare HMO | Admitting: Podiatry

## 2023-08-17 DIAGNOSIS — Z91198 Patient's noncompliance with other medical treatment and regimen for other reason: Secondary | ICD-10-CM

## 2023-08-17 NOTE — Progress Notes (Signed)
 1. Failure to attend appointment with reason given    Patient canceled appt

## 2023-10-05 ENCOUNTER — Encounter: Payer: Self-pay | Admitting: Podiatry

## 2023-10-05 ENCOUNTER — Ambulatory Visit: Payer: Medicare HMO | Admitting: Podiatry

## 2023-10-05 DIAGNOSIS — M79675 Pain in left toe(s): Secondary | ICD-10-CM

## 2023-10-05 DIAGNOSIS — M79674 Pain in right toe(s): Secondary | ICD-10-CM | POA: Diagnosis not present

## 2023-10-05 DIAGNOSIS — B351 Tinea unguium: Secondary | ICD-10-CM

## 2023-10-07 NOTE — Progress Notes (Signed)
  Subjective:  Patient ID: Raymond Rios, male    DOB: 1931/06/24,  MRN: [id number]  [name] presents to clinic today for painful, elongated thickened toenails x 10 which are symptomatic when wearing enclosed Jenniges gear. This interferes with his/her daily activities.  Chief Complaint  Patient presents with   Nail Problem    "Cut my toenails."   New problem(s): None.   PCP is Jaclyn Shaggy, MD.  Allergies  Allergen Reactions   Other Hives   Sulfonamide Derivatives     REACTION: unspecified    Review of Systems: Negative except as noted in the HPI.  Objective: No changes noted in today's physical examination. There were no vitals filed for this visit. Raymond Rios is a pleasant 89 y.o. male in NAD. AAO x 3.  Vascular Examination: CFT <3 seconds b/l. DP/PT pulses faintly palpable b/l. Skin temperature gradient warm to warm b/l. No pain with calf compression. No ischemia or gangrene. No cyanosis or clubbing noted b/l.    Neurological Examination: Sensation grossly intact b/l with 10 gram monofilament. Vibratory sensation intact b/l.   Dermatological Examination: Pedal skin warm and supple b/l.   No open wounds. No interdigital macerations.  Toenails 1-5 b/l thick, discolored, elongated with subungual debris and pain on dorsal palpation.    No hyperkeratotic nor porokeratotic lesions present on today's visit.  Musculoskeletal Examination: Muscle strength 5/5 to all lower extremity muscle groups bilaterally. No pain, crepitus or joint limitation noted with ROM bilateral LE. No gross bony deformities bilaterally.  Radiographs: None  Assessment/Plan: 1. Pain due to onychomycosis of toenails of both feet    Patient was evaluated and treated. All patient's and/or POA's questions/concerns addressed on today's visit. Toenails 1-5 debrided in length and girth without incident. Continue soft, supportive Dupree gear daily. Report any pedal injuries to medical professional. Call office if  there are any questions/concerns. -For dry skin, recommended OTC moisturizers. Patient/POA instructed to apply to foot/feet once daily avoiding application between toes.  -Patient/POA to call should there be question/concern in the interim.   No follow-ups on file.  Freddie Breech, DPM      [place] LOCATION: [address], [place] [address]                   Office [phone]   [facility] LOCATION: [address] [place], [place] [address] Office [phone]

## 2024-01-06 ENCOUNTER — Other Ambulatory Visit: Payer: Self-pay | Admitting: Internal Medicine

## 2024-01-06 DIAGNOSIS — E785 Hyperlipidemia, unspecified: Secondary | ICD-10-CM

## 2024-01-10 ENCOUNTER — Encounter: Payer: Self-pay | Admitting: *Deleted

## 2024-01-11 ENCOUNTER — Encounter: Payer: Self-pay | Admitting: Podiatry

## 2024-01-11 ENCOUNTER — Ambulatory Visit: Payer: Medicare HMO | Admitting: Podiatry

## 2024-01-11 VITALS — Ht 66.0 in | Wt 196.8 lb

## 2024-01-11 DIAGNOSIS — M79674 Pain in right toe(s): Secondary | ICD-10-CM

## 2024-01-11 DIAGNOSIS — B351 Tinea unguium: Secondary | ICD-10-CM

## 2024-01-11 DIAGNOSIS — M79675 Pain in left toe(s): Secondary | ICD-10-CM | POA: Diagnosis not present

## 2024-01-16 ENCOUNTER — Encounter: Payer: Self-pay | Admitting: Nurse Practitioner

## 2024-01-16 ENCOUNTER — Ambulatory Visit: Attending: Nurse Practitioner | Admitting: Nurse Practitioner

## 2024-01-16 VITALS — BP 130/60 | HR 71 | Ht 68.0 in | Wt 192.2 lb

## 2024-01-16 DIAGNOSIS — I480 Paroxysmal atrial fibrillation: Secondary | ICD-10-CM | POA: Diagnosis not present

## 2024-01-16 DIAGNOSIS — I255 Ischemic cardiomyopathy: Secondary | ICD-10-CM | POA: Diagnosis not present

## 2024-01-16 DIAGNOSIS — I5022 Chronic systolic (congestive) heart failure: Secondary | ICD-10-CM

## 2024-01-16 DIAGNOSIS — I10 Essential (primary) hypertension: Secondary | ICD-10-CM

## 2024-01-16 DIAGNOSIS — I2510 Atherosclerotic heart disease of native coronary artery without angina pectoris: Secondary | ICD-10-CM | POA: Diagnosis not present

## 2024-01-16 DIAGNOSIS — E785 Hyperlipidemia, unspecified: Secondary | ICD-10-CM

## 2024-01-16 DIAGNOSIS — R062 Wheezing: Secondary | ICD-10-CM

## 2024-01-16 NOTE — Progress Notes (Signed)
 Office Visit    Patient Name: Raymond Rios Date of Encounter: 01/16/2024  Primary Care Provider:  Westley Hammers, MD Primary Cardiologist:  Sammy Crisp, MD  Chief Complaint    89 y.o. male with a history of CAD status post late presenting anterior STEMI in October 2018 with finding of an occluded LAD, ischemic cardiomyopathy/HFrEF, paroxysmal atrial fibrillation, hypertension hyperlipidemia, COPD, and myasthenia gravis, who presents for follow-up of chronic HFrEF and CAD.  Past Medical History   Subjective   Past Medical History:  Diagnosis Date   Acute respiratory failure with hypoxia (HCC) 11/10/2022   ALLERGY 11/08/2007   Qualifier: Diagnosis of  By: Roxine Cordia MD, Candie Chamber    Anemia    ANXIETY 08/07/1948   Qualifier: Diagnosis of  By: Roxine Cordia MD, Candie Chamber    Arthritis    Chronic HFrEF (heart failure with reduced ejection fraction) (HCC)    a. 05/2017 Echo: EF 30-35%; b. 08/2017 Echo: EF 30-35%; c. 09/2022 Echo: EF 20-25%, glob HK, GrI DD, low-nl RV fxn, sev dil LA.   COPD 08/07/1988   Qualifier: Diagnosis of  By: Roxine Cordia MD, Candie Chamber    Coronary artery disease involving native coronary artery of native heart without angina pectoris 12/06/2017   a. 05/2017 late presenting anterior STEMI/Cath: thrombotic occlusion of pLAD, dominant LCx, OM3 50%, LPDA 80%, ant & apical AK with EF 35%-->Med Rx.   Cramp in limb 10/30/2013   Depression    ECZEMA 10/14/2008   Qualifier: Diagnosis of  By: Roxine Cordia MD, Candie Chamber    Essential hypertension 12/05/2001   Qualifier: Diagnosis of  By: Roxine Cordia MD, Candie Chamber    GERD 10/14/2008   Qualifier: Diagnosis of  By: Roxine Cordia MD, Candie Chamber    Hesitancy 11/19/2013   History of chickenpox    History of colon polyps    HTN (hypertension) 10/30/2013   Hyperlipidemia LDL goal <70 10/30/2013   Ischemic cardiomyopathy    a. late presenting anterior STEMI 05/29/2017; b. TTE 05/30/17: EF 30-35%, AK of the mid-apicalanteroseptal,  anterior, antlat, & apical wall, Gr1DD; c. 08/2017 Echo: EF 30-35%, mid-apicalanteroseptal, ant, apical AK, Gr1 DD. d. 09/2022 Echo: EF 20-25%, glob HK, GrI DD, low-nl RV fxn, sev dil LA; e. Previously refused ICD.   Myasthenia gravis (HCC)    Need for shingles vaccine 02/16/2014   Palpitations 07/02/2020   Paroxysmal atrial fibrillation (HCC) 12/06/2017   Peyronie's disease 11/15/2006   Qualifier: Diagnosis of  By: Roxine Cordia MD, Candie Chamber    RECTAL BLEEDING, HX OF 11/15/2006   Qualifier: Diagnosis of  By: Roxine Cordia MD, Candie Chamber    Routine general medical examination at a health care facility 11/19/2013   Screening for prostate cancer 11/19/2013   SHOULDER PAIN, LEFT, CHRONIC 12/05/2006   Qualifier: Diagnosis of  By: Roxine Cordia MD, Candie Chamber    Tremor 05/16/2018   Urine incontinence    VITAMIN D DEFICIENCY 04/29/2009   Qualifier: Diagnosis of  By: Roxine Cordia MD, Candie Chamber    Past Surgical History:  Procedure Laterality Date   CARDIAC CATHETERIZATION     CHOLECYSTECTOMY     LEFT HEART CATH AND CORONARY ANGIOGRAPHY N/A 05/29/2017   Procedure: LEFT HEART CATH AND CORONARY ANGIOGRAPHY;  Surgeon: Sammy Crisp, MD;  Location: [facility] INVASIVE CV LAB;  Service: Cardiovascular;  Laterality: N/A;   TONSILLECTOMY AND ADENOIDECTOMY      Allergies  Allergies  Allergen Reactions   Other Hives   Sulfonamide Derivatives     REACTION: unspecified  History of Present Illness      89 y.o. y/o male with above complex past medical history including hypertension, hyperlipidemia, COPD, and myasthenia gravis. In October 2018, he presented with a late presenting anterior STEMI. Echo showed an EF of 30-35%. Catheterization showed a thrombotic occlusion of the LAD and otherwise nonobstructive disease. Medical therapy was recommended given late presentation and absence of chest pain. He was noted to have paroxysmal atrial fibrillation during hospitalization and was placed on amiodarone  with  conversion to sinus rhythm. Oral anticoagulation was not initiated given dual antiplatelet therapy, history of dementia, and note the patient was living alone. He had a follow-up echo in January 2019 showing persistent LV dysfunction with an EF of 30 to 35%. He previously declined ICD therapy.  GDMT historically limited by orthostatic lightheadedness and bradycardia.  Follow-up echocardiogram in February 2024, showed an EF of 20 to 25% with global hypokinesis, grade 1 diastolic dysfunction, low-normal RV function, and severely dilated left atrium.   Raymond Rios was last seen in cardiology clinic in December 2024, which time he was wheezing, and it was noted that this dated back to his COVID infection in May 2024.  Pulmonology referral was offered however, patient preferred to follow-up with his primary care provider.  Unfortunately, Raymond Rios has continued to wheeze over the past year.  He does have an albuterol  inhaler which he says he does not use.  He does not feel particularly bothered by wheezing but his son notes that he walks very very slowly in order to avoid dyspnea.  Patient denies chest pain, palpitations, PND, orthopnea, dizziness, syncope, edema, or early satiety.  He again reiterated today that he is not interested in a pulmonary evaluation.   Objective   Home Medications    Current Outpatient Medications  Medication Sig Dispense Refill   acetaminophen  (TYLENOL ) 325 MG tablet Take 2 tablets (650 mg total) by mouth every 6 (six) hours as needed for mild pain, fever or headache.     albuterol  (VENTOLIN  HFA) 108 (90 Base) MCG/ACT inhaler Inhale 2 puffs into the lungs every 6 (six) hours as needed for wheezing or shortness of breath. 8 g 2   aspirin  EC 81 MG tablet Take 1 tablet (81 mg total) by mouth daily. Start once you have finished your plavix . 90 tablet 3   atorvastatin  (LIPITOR) 40 MG tablet TAKE 1 TABLET BY MOUTH DAILY AT 6 PM. 90 tablet 2   carvedilol  (COREG ) 3.125 MG tablet Take 1  tablet (3.125 mg total) by mouth 2 (two) times daily with a meal. 180 tablet 3   clobetasol ointment (TEMOVATE) 0.05 % Apply 1 Application topically 2 (two) times daily as needed.     furosemide  (LASIX ) 20 MG tablet Take 1 tablet (20 mg total) by mouth daily. 90 tablet 3   levothyroxine  (SYNTHROID , LEVOTHROID) 75 MCG tablet Take 75 mcg by mouth every morning.  1   lisinopril  (ZESTRIL ) 2.5 MG tablet Take 1 tablet (2.5 mg total) by mouth daily. 90 tablet 3   omeprazole (PRILOSEC) 20 MG capsule Take 20 mg by mouth daily.     PARoxetine  (PAXIL ) 20 MG tablet Take 20 mg by mouth daily.     tamsulosin  (FLOMAX ) 0.4 MG CAPS capsule Take 0.4 mg by mouth daily.     feeding supplement (ENSURE ENLIVE / ENSURE PLUS) LIQD Take 237 mLs by mouth daily. (Patient not taking: Reported on 01/16/2024) 237 mL 12   No current facility-administered medications for this visit.  Physical Exam    VS:  BP 130/60 (BP Location: Right Arm, Patient Position: Sitting, Cuff Size: Normal)   Pulse 71   Ht 5' 8 (1.727 m)   Wt 192 lb 4 oz (87.2 kg)   SpO2 96%   BMI 29.23 kg/m  , BMI Body mass index is 29.23 kg/m.       GEN: Well nourished, well developed, in no acute distress. HEENT: normal. Neck: Supple, no JVD, carotid bruits, or masses. Cardiac: RRR, occasional ectopy, no murmurs, rubs, or gallops. No clubbing, cyanosis, trace bilateral edema.  Radials 2+/PT 2+ and equal bilaterally.  Respiratory:  Respirations regular and unlabored, diffuse crackles bilaterally with expiratory wheezing noted posteriorly and anteriorly. GI: Soft, nontender, nondistended, BS + x 4. MS: no deformity or atrophy. Skin: warm and dry.  Skin of lower legs and ankles is very dry with plaque formation on the left. Neuro:  Strength and sensation are intact. Psych: Normal affect.  Accessory Clinical Findings    ECG personally reviewed by me today - EKG Interpretation Date/Time:  Wednesday January 16 2024 14:08:54 EDT Ventricular Rate:   71 PR Interval:  156 QRS Duration:  78 QT Interval:  412 QTC Calculation: 447 R Axis:   -38  Text Interpretation: Sinus rhythm with occasional Premature ventricular complexes and Premature atrial complexes Left axis deviation Septal infarct (cited on or before 10-Nov-2022) T wave abnormality, consider lateral ischemia Confirmed by Laneta Pintos [phone]) on 01/16/2024 2:20:17 PM  - no acute changes.  Lab Results  Component Value Date   WBC 7.0 11/12/2022   HGB 10.5 (L) 11/12/2022   HCT 32.9 (L) 11/12/2022   MCV 85.7 11/12/2022   PLT 150 11/12/2022   Lab Results  Component Value Date   CREATININE 0.98 12/28/2022   BUN 24 (H) 12/28/2022   NA 139 12/28/2022   K 4.3 12/28/2022   CL 104 12/28/2022   CO2 28 12/28/2022   Lab Results  Component Value Date   ALT 23 06/17/2022   AST 25 06/17/2022   ALKPHOS 56 06/17/2022   BILITOT 0.6 06/17/2022   Lab Results  Component Value Date   CHOL 89 05/30/2017   HDL 46 05/30/2017   LDLCALC 34 05/30/2017   LDLDIRECT 190.3 08/23/2009   TRIG 44 05/30/2017   CHOLHDL 1.9 05/30/2017    Lab Results  Component Value Date   TSH 8.290 (H) 06/17/2022       Assessment & Plan    1.  Coronary artery disease: Status post late presenting anterior STEMI in October 2018 with finding of occluded proximal LAD and severe disease in the LPDA.  He was medically managed.  He does not experience chest pain.  He does have some degree of chronic dyspnea on exertion, which is unchanged.  He remains on aspirin , statin, beta-blocker, and ACE inhibitor therapy.  2.  Chronic HFrEF/ischemic cardiomyopathy: EF 20 to 25% with global hypokinesis, grade 1 diastolic dysfunction, low normal RV function, and severely dilated left atrium by echo in February 2024.  He has some degree of chronic dyspnea on exertion which she says is unchanged.  His son notes that he walks very slowly in order to compensate.  On examination, he has trace ankle edema and weight is stable over  the past 6 months.  He remains on beta-blocker, ACE inhibitor, and low-dose Lasix .  I offered to obtain lab work however, he and his son note that labs have been followed by primary care within the past 6 months.  We will try and obtain that lab work.  3.  Hyperlipidemia: Followed by primary care.  Remains on statin therapy.  4.  Wheezing/COPD: Patient developed COVID-19 infection in the spring 2024 followed by pneumonia requiring hospitalization.  He was noted to be wheezing on examination at cardiology visit last May, and has continued to wheeze over the course of the past year.  He has chronic dyspnea on exertion.  He does have a albuterol  inhaler but has not been using it.  He was offered pulmonology referral a year ago but declined and on examination today, in the setting of diffuse posterior crackles as well as anterior and posterior wheezing, I again suggested he would benefit from pulmonology referral.  He says he is not interested in this time and will follow-up with his primary care provider and start using his albuterol .  I advised that if symptoms do not improve, he or his son should feel free to contact us  so that we may place the referral.  5.  Paroxysmal atrial fibrillation: This was noted at the time of his myocardial infarction without known recurrence.  He is in sinus rhythm today.  He remains on beta-blocker therapy.  He is on aspirin  only as anticoagulation was not initiated at the time of his A-fib due to brief episode and need for dual antiplatelet therapy at that time.  6.  Disposition: Follow-up in 6 months or sooner if necessary.  Laneta Pintos, NP 01/16/2024, 6:03 PM

## 2024-01-16 NOTE — Patient Instructions (Signed)
 Medication Instructions:   Your physician recommends that you continue on your current medications as directed. Please refer to the Current Medication list given to you today.   *If you need a refill on your cardiac medications before your next appointment, please call your pharmacy*  Lab Work:  No labs ordered today   If you have labs (blood work) drawn today and your tests are completely normal, you will receive your results only by: MyChart Message (if you have MyChart) OR A paper copy in the mail If you have any lab test that is abnormal or we need to change your treatment, we will call you to review the results.  Testing/Procedures:  No test ordered today   Follow-Up: At [facility], you and your health needs are our priority.  As part of our continuing mission to provide you with exceptional heart care, our providers are all part of one team.  This team includes your primary Cardiologist (physician) and Advanced Practice Providers or APPs (Physician Assistants and Nurse Practitioners) who all work together to provide you with the care you need, when you need it.  Your next appointment:    6 month(s)  Provider:   Sammy Crisp, MD or Laneta Pintos, NP

## 2024-01-18 NOTE — Progress Notes (Signed)
  Subjective:  Patient ID: Raymond Rios, male    DOB: 1931/05/17,  MRN: [id number]  Armstrong D Fraser presents to clinic today for painful elongated mycotic toenails 1-5 bilaterally which are tender when wearing enclosed Kutter gear. Pain is relieved with periodic professional debridement. He states he has an area on his left ankle which itches. Chief Complaint  Patient presents with   Nail Problem    Pt is here for [facility] PCP is Dr End and LOV was in December.   New problem(s): None.   PCP is Westley Hammers, MD.  Allergies  Allergen Reactions   Other Hives   Sulfonamide Derivatives     REACTION: unspecified    Review of Systems: Negative except as noted in the HPI.  Objective: There were no vitals filed for this visit. Raymond Rios is a pleasant 89 y.o. male in NAD. AAO x 3.  Vascular Examination: CFT <3 seconds b/l. DP/PT pulses faintly palpable b/l. Skin temperature gradient warm to warm b/l. No pain with calf compression. No ischemia or gangrene. No cyanosis or clubbing noted b/l. Evidence of skin changes consistent with long term venous stasis BLE.  Neurological Examination: Sensation grossly intact b/l with 10 gram monofilament. Vibratory sensation intact b/l.   Dermatological Examination: Pedal skin warm and supple b/l. Signs of venous stasis dermatitis left medial ankle.  No open wounds. No interdigital macerations.  Toenails 1-5 b/l thick, discolored, elongated with subungual debris and pain on dorsal palpation.    No hyperkeratotic nor porokeratotic lesions present on today's visit.  Musculoskeletal Examination: Muscle strength 5/5 to all lower extremity muscle groups bilaterally. No pain, crepitus or joint limitation noted with ROM bilateral LE. No gross bony deformities bilaterally.  Radiographs: None  Assessment/Plan: 1. Pain due to onychomycosis of toenails of both feet   Discussed venous stasis dermatitis and offered prescription for topical steroid cream. Patient  declined and stated he would discuss with PCP. Consent given for treatment. Patient examined. All patient's and/or POA's questions/concerns addressed on today's visit. Mycotic toenails 1-5 debrided in length and girth without incident. Continue soft, supportive Guillotte gear daily. Report any pedal injuries to medical professional. Call office if there are any quesitons/concerns. Patient/POA to call should there be question/concern in the interim.   Return in about 3 months (around 04/12/2024).  Raymond Rios, DPM      [place] LOCATION: [address] [place] [address]                   Office [phone]   [facility] LOCATION: [address] [place], [place] [address] Office [phone]

## 2024-01-25 ENCOUNTER — Other Ambulatory Visit: Payer: Self-pay | Admitting: Internal Medicine

## 2024-02-06 ENCOUNTER — Other Ambulatory Visit: Payer: Self-pay | Admitting: Physician Assistant

## 2024-02-07 ENCOUNTER — Telehealth: Payer: Self-pay | Admitting: Internal Medicine

## 2024-02-07 MED ORDER — CARVEDILOL 3.125 MG PO TABS: 3.1250 mg | ORAL_TABLET | Freq: Two times a day (BID) | ORAL | 3 refills | Status: AC

## 2024-02-07 NOTE — Telephone Encounter (Signed)
 Pt's medication was sent to pt's pharmacy as requested. Confirmation received.

## 2024-02-07 NOTE — Telephone Encounter (Signed)
*  STAT* If patient is at the pharmacy, call can be transferred to refill team.   1. Which medications need to be refilled? (please list name of each medication and dose if known)   carvedilol  (COREG ) 3.125 MG tablet     2. Would you like to learn more about the convenience, safety, & potential cost savings by using the [facility]? No   3. Are you open to using the [facility] (Type [facility]. ) No   4. Which pharmacy/location (including street and city if local pharmacy) is medication to be sent to?  [id number] [place], [place] - [address] Phone: [phone]  Fax: [phone]       5. Do they need a 30 day or 90 day supply? 90 day

## 2024-02-24 ENCOUNTER — Observation Stay
Admission: EM | Admit: 2024-02-24 | Discharge: 2024-02-29 | Disposition: A | Discharge status: Home / Self Care | Attending: Internal Medicine | Admitting: Internal Medicine

## 2024-02-24 ENCOUNTER — Emergency Department

## 2024-02-24 ENCOUNTER — Observation Stay

## 2024-02-24 ENCOUNTER — Other Ambulatory Visit: Payer: Self-pay

## 2024-02-24 DIAGNOSIS — I5A Non-ischemic myocardial injury (non-traumatic): Secondary | ICD-10-CM | POA: Insufficient documentation

## 2024-02-24 DIAGNOSIS — R7989 Other specified abnormal findings of blood chemistry: Secondary | ICD-10-CM | POA: Diagnosis not present

## 2024-02-24 DIAGNOSIS — I2510 Atherosclerotic heart disease of native coronary artery without angina pectoris: Secondary | ICD-10-CM | POA: Diagnosis not present

## 2024-02-24 DIAGNOSIS — K760 Fatty (change of) liver, not elsewhere classified: Secondary | ICD-10-CM | POA: Diagnosis not present

## 2024-02-24 DIAGNOSIS — J181 Lobar pneumonia, unspecified organism: Secondary | ICD-10-CM | POA: Diagnosis not present

## 2024-02-24 DIAGNOSIS — I480 Paroxysmal atrial fibrillation: Secondary | ICD-10-CM | POA: Insufficient documentation

## 2024-02-24 DIAGNOSIS — I110 Hypertensive heart disease with heart failure: Secondary | ICD-10-CM | POA: Insufficient documentation

## 2024-02-24 DIAGNOSIS — I255 Ischemic cardiomyopathy: Secondary | ICD-10-CM | POA: Diagnosis not present

## 2024-02-24 DIAGNOSIS — M6282 Rhabdomyolysis: Secondary | ICD-10-CM | POA: Diagnosis not present

## 2024-02-24 DIAGNOSIS — F419 Anxiety disorder, unspecified: Secondary | ICD-10-CM | POA: Diagnosis not present

## 2024-02-24 DIAGNOSIS — R2689 Other abnormalities of gait and mobility: Secondary | ICD-10-CM | POA: Diagnosis not present

## 2024-02-24 DIAGNOSIS — Z87891 Personal history of nicotine dependence: Secondary | ICD-10-CM | POA: Diagnosis not present

## 2024-02-24 DIAGNOSIS — I10 Essential (primary) hypertension: Secondary | ICD-10-CM | POA: Diagnosis present

## 2024-02-24 DIAGNOSIS — R945 Abnormal results of liver function studies: Secondary | ICD-10-CM | POA: Insufficient documentation

## 2024-02-24 DIAGNOSIS — R531 Weakness: Secondary | ICD-10-CM

## 2024-02-24 DIAGNOSIS — Z9181 History of falling: Secondary | ICD-10-CM | POA: Diagnosis present

## 2024-02-24 DIAGNOSIS — K219 Gastro-esophageal reflux disease without esophagitis: Secondary | ICD-10-CM | POA: Insufficient documentation

## 2024-02-24 DIAGNOSIS — Z7901 Long term (current) use of anticoagulants: Secondary | ICD-10-CM | POA: Diagnosis not present

## 2024-02-24 DIAGNOSIS — G7000 Myasthenia gravis without (acute) exacerbation: Secondary | ICD-10-CM | POA: Diagnosis not present

## 2024-02-24 DIAGNOSIS — W19XXXA Unspecified fall, initial encounter: Principal | ICD-10-CM | POA: Insufficient documentation

## 2024-02-24 DIAGNOSIS — S4990XA Unspecified injury of shoulder and upper arm, unspecified arm, initial encounter: Secondary | ICD-10-CM | POA: Diagnosis present

## 2024-02-24 DIAGNOSIS — E039 Hypothyroidism, unspecified: Secondary | ICD-10-CM | POA: Insufficient documentation

## 2024-02-24 DIAGNOSIS — E785 Hyperlipidemia, unspecified: Secondary | ICD-10-CM | POA: Insufficient documentation

## 2024-02-24 DIAGNOSIS — N400 Enlarged prostate without lower urinary tract symptoms: Secondary | ICD-10-CM | POA: Insufficient documentation

## 2024-02-24 DIAGNOSIS — F32A Depression, unspecified: Secondary | ICD-10-CM | POA: Diagnosis not present

## 2024-02-24 DIAGNOSIS — R4182 Altered mental status, unspecified: Secondary | ICD-10-CM | POA: Diagnosis present

## 2024-02-24 DIAGNOSIS — I5022 Chronic systolic (congestive) heart failure: Secondary | ICD-10-CM | POA: Insufficient documentation

## 2024-02-24 DIAGNOSIS — E876 Hypokalemia: Secondary | ICD-10-CM | POA: Insufficient documentation

## 2024-02-24 LAB — COMPREHENSIVE METABOLIC PANEL WITH GFR
ALT: 87 U/L — ABNORMAL HIGH (ref 0–44)
AST: 162 U/L — ABNORMAL HIGH (ref 15–41)
Albumin: 3 g/dL — ABNORMAL LOW (ref 3.5–5.0)
Alkaline Phosphatase: 405 U/L — ABNORMAL HIGH (ref 38–126)
Anion gap: 12 (ref 5–15)
BUN: 29 mg/dL — ABNORMAL HIGH (ref 8–23)
CO2: 25 mmol/L (ref 22–32)
Calcium: 9.1 mg/dL (ref 8.9–10.3)
Chloride: 99 mmol/L (ref 98–111)
Creatinine, Ser: 1.04 mg/dL (ref 0.61–1.24)
GFR, Estimated: >60 mL/min (ref >60)
Glucose, Bld: 127 mg/dL — ABNORMAL HIGH (ref 70–99)
Potassium: 3.7 mmol/L (ref 3.5–5.1)
Sodium: 136 mmol/L (ref 135–145)
Total Bilirubin: 1.2 mg/dL (ref 0.0–1.2)
Total Protein: 9 g/dL — ABNORMAL HIGH (ref 6.5–8.1)

## 2024-02-24 LAB — URINALYSIS, COMPLETE (UACMP) WITH MICROSCOPIC
Bilirubin Urine: NEGATIVE
Glucose, UA: NEGATIVE mg/dL
Ketones, ur: 5 mg/dL — AB
Leukocytes,Ua: NEGATIVE
Nitrite: NEGATIVE
Protein, ur: 30 mg/dL — AB
Specific Gravity, Urine: 1.026 (ref 1.005–1.030)
pH: 5 (ref 5.0–8.0)

## 2024-02-24 LAB — CBC WITH DIFFERENTIAL/PLATELET
Abs Immature Granulocytes: 0.09 K/uL — ABNORMAL HIGH (ref 0.00–0.07)
Basophils Absolute: 0.1 K/uL (ref 0.0–0.1)
Basophils Relative: 1 %
Eosinophils Absolute: 0.1 K/uL (ref 0.0–0.5)
Eosinophils Relative: 1 %
HCT: 42 % (ref 39.0–52.0)
Hemoglobin: 13.4 g/dL (ref 13.0–17.0)
Immature Granulocytes: 1 %
Lymphocytes Relative: 10 %
Lymphs Abs: 1 K/uL (ref 0.7–4.0)
MCH: 28 pg (ref 26.0–34.0)
MCHC: 31.9 g/dL (ref 30.0–36.0)
MCV: 87.9 fL (ref 80.0–100.0)
Monocytes Absolute: 1.3 K/uL — ABNORMAL HIGH (ref 0.1–1.0)
Monocytes Relative: 13 %
Neutro Abs: 8 K/uL — ABNORMAL HIGH (ref 1.7–7.7)
Neutrophils Relative %: 74 %
Platelets: 186 K/uL (ref 150–400)
RBC: 4.78 MIL/uL (ref 4.22–5.81)
RDW: 14.7 % (ref 11.5–15.5)
WBC: 10.5 K/uL (ref 4.0–10.5)
nRBC: 0 % (ref 0.0–0.2)

## 2024-02-24 LAB — TROPONIN I (HIGH SENSITIVITY)
Troponin I (High Sensitivity): 58 ng/L — ABNORMAL HIGH (ref <18)
Troponin I (High Sensitivity): 50 ng/L — ABNORMAL HIGH (ref <18)

## 2024-02-24 LAB — CK: Total CK: 2439 U/L — ABNORMAL HIGH (ref 49–397)

## 2024-02-24 LAB — BRAIN NATRIURETIC PEPTIDE: B Natriuretic Peptide: 363.1 pg/mL — ABNORMAL HIGH (ref 0.0–100.0)

## 2024-02-24 LAB — PHOSPHORUS: Phosphorus: 3 mg/dL (ref 2.5–4.6)

## 2024-02-24 LAB — MAGNESIUM: Magnesium: 2.4 mg/dL (ref 1.7–2.4)

## 2024-02-24 MED ORDER — ONDANSETRON HCL 4 MG PO TABS: 4.0000 mg | ORAL_TABLET | Freq: Four times a day (QID) | ORAL | Status: DC | PRN

## 2024-02-24 MED ORDER — FUROSEMIDE 20 MG PO TABS
20.0000 mg | ORAL_TABLET | ORAL | Status: DC
  Administered 2024-02-24: 20 mg via ORAL
  Filled 2024-02-24: qty 1

## 2024-02-24 MED ORDER — PANTOPRAZOLE SODIUM 40 MG PO TBEC
40.0000 mg | DELAYED_RELEASE_TABLET | Freq: Every day | ORAL | Status: DC
  Administered 2024-02-24 – 2024-02-29 (×6): 40 mg via ORAL
  Filled 2024-02-24 (×6): qty 1

## 2024-02-24 MED ORDER — ENOXAPARIN SODIUM 40 MG/0.4ML IJ SOSY
40.0000 mg | PREFILLED_SYRINGE | INTRAMUSCULAR | Status: DC
  Administered 2024-02-24 – 2024-02-28 (×5): 40 mg via SUBCUTANEOUS
  Filled 2024-02-24 (×5): qty 0.4

## 2024-02-24 MED ORDER — ACETAMINOPHEN 650 MG RE SUPP
650.0000 mg | Freq: Four times a day (QID) | RECTAL | Status: DC | PRN
Start: 2024-02-24 — End: 2024-02-29

## 2024-02-24 MED ORDER — SODIUM CHLORIDE 0.9 % IV BOLUS
1000.0000 mL | Freq: Once | INTRAVENOUS | Status: AC
  Administered 2024-02-24: 1000 mL via INTRAVENOUS

## 2024-02-24 MED ORDER — ALBUTEROL SULFATE (2.5 MG/3ML) 0.083% IN NEBU
2.5000 mg | INHALATION_SOLUTION | RESPIRATORY_TRACT | Status: DC | PRN
  Administered 2024-02-24: 2.5 mg via RESPIRATORY_TRACT
  Filled 2024-02-24: qty 3

## 2024-02-24 MED ORDER — ACETAMINOPHEN 325 MG PO TABS
650.0000 mg | ORAL_TABLET | Freq: Four times a day (QID) | ORAL | Status: DC | PRN
  Administered 2024-02-25 – 2024-02-27 (×3): 650 mg via ORAL
  Filled 2024-02-24 (×3): qty 2

## 2024-02-24 MED ORDER — ONDANSETRON HCL 4 MG/2ML IJ SOLN: 4.0000 mg | Freq: Four times a day (QID) | INTRAMUSCULAR | Status: DC | PRN

## 2024-02-24 MED ORDER — PAROXETINE HCL 20 MG PO TABS
20.0000 mg | ORAL_TABLET | Freq: Every day | ORAL | Status: DC
  Administered 2024-02-24 – 2024-02-29 (×6): 20 mg via ORAL
  Filled 2024-02-24 (×6): qty 1

## 2024-02-24 MED ORDER — ASPIRIN 81 MG PO TBEC
81.0000 mg | DELAYED_RELEASE_TABLET | Freq: Every day | ORAL | Status: DC
  Administered 2024-02-24 – 2024-02-29 (×6): 81 mg via ORAL
  Filled 2024-02-24 (×6): qty 1

## 2024-02-24 MED ORDER — TAMSULOSIN HCL 0.4 MG PO CAPS
0.4000 mg | ORAL_CAPSULE | Freq: Every day | ORAL | Status: DC
  Administered 2024-02-24 – 2024-02-29 (×6): 0.4 mg via ORAL
  Filled 2024-02-24 (×6): qty 1

## 2024-02-24 MED ORDER — SODIUM CHLORIDE 0.9 % IV SOLN: INTRAVENOUS | Status: DC

## 2024-02-24 MED ORDER — LORAZEPAM 0.5 MG PO TABS: 0.5000 mg | ORAL_TABLET | Freq: Once | ORAL | Status: DC | PRN

## 2024-02-24 MED ORDER — LEVOTHYROXINE SODIUM 75 MCG PO TABS
75.0000 ug | ORAL_TABLET | Freq: Every morning | ORAL | Status: DC
  Administered 2024-02-25 – 2024-02-29 (×5): 75 ug via ORAL
  Filled 2024-02-24 (×2): qty 3
  Filled 2024-02-24 (×5): qty 1
  Filled 2024-02-24 (×2): qty 3

## 2024-02-24 NOTE — ED Notes (Signed)
 Cardiac monitoring in room 9 is not working correctly despite multiple interventions done by myself and the charge nurse. Portable EKG obtained which showed NSR with PVCs.

## 2024-02-24 NOTE — ED Notes (Signed)
 Patient transported to CT

## 2024-02-24 NOTE — H&P (Signed)
 History and Physical    PatientSIONE Rios FMW:[id number] DOB: 1930-09-29 DOA: 02/24/2024 DOS: the patient was seen and examined on 02/24/2024 PCP: [name]  Patient coming from: ALF/ILF  Chief Complaint:  Chief Complaint  Patient presents with   Shoulder Injury   Fall   Hip Pain   HPI: Raymond Rios is a 89 y.o. male with medical history significant of seasonal allergies, anxiety, osteoarthritis, unspecified anemia, urinary hesitancy, BPH, Peyronie's disease, GERD, hypertension, hyperlipidemia, CAD, ischemic cardiomyopathy, chronic combined systolic and diastolic heart failure, palpitations, paroxysmal atrial fibrillation, myasthenia gravis, benign familial tremor, rectal bleeding, history of community-acquired pneumonia, lower extremity cramps, varicella-zoster, hypothyroidism, vitamin D deficiency who was found on the floor his assisted living facility after having a fall apparently at some point yesterday according to his son with complaints of left shoulder and left hip pain. He denied having any head trauma but had dried blood on his left facial area.  According to his son, his memory is impaired compared to his baseline.  He knew it was Sunday, but was otherwise disoriented to time and date.  He knows he is in the hospital in [place].  He denied headache, chest, back or abdominal pain at the moment.  Lab work: Urinalysis show moderate hemoglobin, ketones of 5 and protein of 30 mg/dL.  There was rare bacteria microscopic examination.  CBCs are white count 10.5, hemoglobin 13.4 g/dL platelets 813.  Troponin was 58 then 50 ng/L and BNP 363.1 pg/mL.  Total CK20 439 units/L.  Phosphorus 3.0 and magnesium  2.4 mg/dL.  CMP showed normal electrolytes and creatinine.  Glucose 127 and BUN 29 mg/dL.  GFR greater than 60 mL/min.  Total protein is 9.0 and albumin 3.0 g/dL.  AST 162, ALT 87 and alkaline phosphatase 405 units/L.  Imaging: Left shoulder x-ray showing chronic rotator cuff  pathology but no acute fracture or dislocation.  Left hip showing calcified atherosclerosis, but no acute fracture or dislocation.  CT head without contrast with no acute intracranial normality or acute traumatic injury.  Stable noncontrast CT appearance of advanced chronic small vessel disease.  Mild increase paranasal sinus inflammation since last year.  CT cervical spine with no acute traumatic injury identified in the cervical spine.  Stable cervical spine degeneration from last year.  Mild retained secretions in the tract.  Aortic atherosclerosis.  RUQ ultrasound showing status postcholecystectomy and hepatic asteatosis.  Portable 1 view chest radiograph showing underinflation with enlarged heart and some vascular congestion.  Mild left lung base patchy opacity.  Infiltrate is possible.  Follow-up recommended.   ED course: Initial vital signs were temperature 98.4 F, pulse 76, respiration 18, BP 143/96 mmHg O2 sat 95% on room air.  Review of Systems: As mentioned in the history of present illness. All other systems reviewed and are negative. Past Medical History:  Diagnosis Date   Acute respiratory failure with hypoxia (HCC) 11/10/2022   ALLERGY 11/08/2007   Qualifier: Diagnosis of  By: Bartley MD, Lamar Mulch    Anemia    ANXIETY 08/07/1946   Qualifier: Diagnosis of  By: Bartley MD, Lamar Mulch    Arthritis    Chronic HFrEF (heart failure with reduced ejection fraction) (HCC)    a. 05/2017 Echo: EF 30-35%; b. 08/2017 Echo: EF 30-35%; c. 09/2022 Echo: EF 20-25%, glob HK, GrI DD, low-nl RV fxn, sev dil LA.   COPD 08/07/1988   Qualifier: Diagnosis of  By: Bartley MD, Lamar Mulch    Coronary artery disease involving  native coronary artery of native heart without angina pectoris 12/06/2017   a. 05/2017 late presenting anterior STEMI/Cath: thrombotic occlusion of pLAD, dominant LCx, OM3 50%, LPDA 80%, ant & apical AK with EF 35%-->Med Rx.   Cramp in limb 10/30/2013   Depression    ECZEMA  10/14/2008   Qualifier: Diagnosis of  By: Bartley MD, Lamar Mulch    Essential hypertension 12/05/2001   Qualifier: Diagnosis of  By: Bartley MD, Lamar Mulch    GERD 10/14/2008   Qualifier: Diagnosis of  By: Bartley MD, Lamar Mulch    Hesitancy 11/19/2013   History of chickenpox    History of colon polyps    HTN (hypertension) 10/30/2013   Hyperlipidemia LDL goal <70 10/30/2013   Ischemic cardiomyopathy    a. late presenting anterior STEMI 05/29/2017; b. TTE 05/30/17: EF 30-35%, AK of the mid-apicalanteroseptal, anterior, antlat, & apical wall, Gr1DD; c. 08/2017 Echo: EF 30-35%, mid-apicalanteroseptal, ant, apical AK, Gr1 DD. d. 09/2022 Echo: EF 20-25%, glob HK, GrI DD, low-nl RV fxn, sev dil LA; e. Previously refused ICD.   Myasthenia gravis (HCC)    Need for shingles vaccine 02/16/2014   Palpitations 07/02/2020   Paroxysmal atrial fibrillation (HCC) 12/06/2017   Peyronie's disease 11/15/2006   Qualifier: Diagnosis of  By: Bartley MD, Lamar Mulch    RECTAL BLEEDING, HX OF 11/15/2006   Qualifier: Diagnosis of  By: Bartley MD, Lamar Mulch    Routine general medical examination at a health care facility 11/19/2013   Screening for prostate cancer 11/19/2013   SHOULDER PAIN, LEFT, CHRONIC 12/05/2006   Qualifier: Diagnosis of  By: Bartley MD, Lamar Mulch    Tremor 05/16/2018   Urine incontinence    VITAMIN D DEFICIENCY 04/29/2009   Qualifier: Diagnosis of  By: Bartley MD, Lamar Mulch    Past Surgical History:  Procedure Laterality Date   CARDIAC CATHETERIZATION     CHOLECYSTECTOMY     LEFT HEART CATH AND CORONARY ANGIOGRAPHY N/A 05/29/2017   Procedure: LEFT HEART CATH AND CORONARY ANGIOGRAPHY;  Surgeon: Mady Bruckner, MD;  Location: [facility] INVASIVE CV LAB;  Service: Cardiovascular;  Laterality: N/A;   TONSILLECTOMY AND ADENOIDECTOMY     Social History:  reports that he has quit smoking. He has quit using smokeless tobacco. He reports that he does not drink alcohol and does not  use drugs.  Allergies  Allergen Reactions   Other Hives   Sulfonamide Derivatives     REACTION: unspecified    Family History  Problem Relation Age of Onset   Hyperlipidemia Mother    Arthritis Father    Hyperlipidemia Father     Prior to Admission medications   Medication Sig Start Date End Date Taking? Authorizing Provider  acetaminophen  (TYLENOL ) 325 MG tablet Take 2 tablets (650 mg total) by mouth every 6 (six) hours as needed for mild pain, fever or headache. 11/13/22   Fausto Burnard LABOR, DO  albuterol  (VENTOLIN  HFA) 108 (90 Base) MCG/ACT inhaler Inhale 2 puffs into the lungs every 6 (six) hours as needed for wheezing or shortness of breath. 11/13/22   Fausto Burnard LABOR, DO  aspirin  EC 81 MG tablet Take 1 tablet (81 mg total) by mouth daily. Start once you have finished your plavix . 05/16/18   End, Bruckner, MD  atorvastatin  (LIPITOR) 40 MG tablet TAKE 1 TABLET BY MOUTH DAILY AT 6 PM. 01/07/24   End, Bruckner, MD  carvedilol  (COREG ) 3.125 MG tablet Take 1 tablet (3.125 mg total) by mouth 2 (two) times daily with  a meal. 02/07/24   End, Lonni, MD  clobetasol ointment (TEMOVATE) 0.05 % Apply 1 Application topically 2 (two) times daily as needed. 11/01/22   [provider]  feeding supplement (ENSURE ENLIVE / ENSURE PLUS) LIQD Take 237 mLs by mouth daily. Patient not taking: Reported on 01/16/2024 11/13/22   Fausto Sor A, DO  furosemide  (LASIX ) 20 MG tablet TAKE 1 TABLET BY MOUTH EVERY OTHER DAY 02/07/24   End, Lonni, MD  levothyroxine  (SYNTHROID , LEVOTHROID) 75 MCG tablet Take 75 mcg by mouth every morning. 03/23/17   [provider]  lisinopril  (ZESTRIL ) 2.5 MG tablet TAKE 1 TABLET EVERY DAY 01/29/24   End, Lonni, MD  omeprazole (PRILOSEC) 20 MG capsule Take 20 mg by mouth daily.    [provider]  PARoxetine  (PAXIL ) 20 MG tablet Take 20 mg by mouth daily. 11/16/21   [provider]  tamsulosin  (FLOMAX ) 0.4 MG CAPS capsule Take 0.4  mg by mouth daily.    [provider]    Physical Exam: Vitals:   02/24/24 0835 02/24/24 0839  BP:  (!) 143/96  Pulse:  76  Resp:  18  Temp:  98.4 F (36.9 C)  TempSrc:  Oral  SpO2:  95%  Weight: 81 kg   Height: 5' 8 (1.727 m)    Physical Exam Vitals and nursing note reviewed.  Constitutional:      General: He is awake. He is not in acute distress.    Appearance: Normal appearance. He is ill-appearing.  HENT:     Head: Normocephalic.     Nose: No rhinorrhea.     Mouth/Throat:     Mouth: Mucous membranes are dry.  Eyes:     General: No scleral icterus.    Pupils: Pupils are equal, round, and reactive to light.  Neck:     Vascular: No JVD.  Cardiovascular:     Rate and Rhythm: Normal rate and regular rhythm.     Heart sounds: S1 normal and S2 normal.  Pulmonary:     Effort: Pulmonary effort is normal.     Breath sounds: Normal breath sounds. No wheezing, rhonchi or rales.  Abdominal:     General: Bowel sounds are normal. There is no distension.     Palpations: Abdomen is soft.     Tenderness: There is no abdominal tenderness. There is no right CVA tenderness or left CVA tenderness.  Musculoskeletal:     Cervical back: Neck supple.     Right lower leg: No edema.     Left lower leg: No edema.  Skin:    General: Skin is warm and dry.  Neurological:     General: No focal deficit present.     Mental Status: He is alert. He is disoriented.  Psychiatric:        Mood and Affect: Mood normal.        Behavior: Behavior normal. Behavior is cooperative.     Data Reviewed:  Results are pending, will review when available. 09/20/2022 echocardiogram report. IMPRESSIONS:   1. Left ventricular ejection fraction, by estimation, is 20 to 25%. The  left ventricle has severely decreased function. The left ventricle  demonstrates global hypokinesis. Left ventricular diastolic parameters are  consistent with Grade I diastolic  dysfunction (impaired relaxation).    2. Right ventricular systolic function is low normal. The right  ventricular size is not well visualized.   3. Left atrial size was severely dilated.   4. The mitral valve is normal in structure.  No evidence of mitral valve  regurgitation.   5. The aortic valve was not well visualized. Aortic valve regurgitation  is not visualized. Aortic valve sclerosis/calcification is present,  without any evidence of aortic stenosis.   EKG: Vent. rate 68 BPM PR interval 148 ms QRS duration 90 ms QT/QTcB 398/423 ms P-R-T axes 45 -9 88 Sinus rhythm with occasional Premature ventricular complexes Septal infarct (cited on or before 10-Nov-2022) Abnormal ECG  Assessment and Plan: Principal Problem:   Rhabdomyolysis Observation/telemetry. Limit physical activity. Hold atorvastatin . Careful IV hydration. Avoid volume overload. -Will continue home furosemide . Obtain echocardiogram. Follow total CK in AM.  Active Problems:   Elevated troponin No uptrend. Likely cross reaction with elevated CK.    Essential hypertension Continue carvedilol  3.125 mg p.o. twice daily. Hold low-dose lisinopril . Continue furosemide  every other day. -May need to be increased to daily dosing.    Chronic HFrEF (heart failure with reduced ejection fraction) (HCC) Continue beta-blocker and diuretic. Holding ACE inhibitor and following renal function closely.    Ischemic cardiomyopathy Secondary to:   Coronary artery disease Continue aspirin  and beta-blocker. Holding statin in the setting of rhabdomyolysis..    Paroxysmal atrial fibrillation (HCC) Apparently single episode. Not on anticoagulation. Continue carvedilol  for rate control.    Abnormal LFTs  Might be cross-reaction with elevated CK.    Hepatic steatosis  No history of EtOH. May benefit from weight loss.    GERD Continue omeprazole or formulary equivalent.    Hyperlipidemia LDL goal <70 Holding atorvastatin .    Hypothyroidism Continue  levothyroxine  75 mg p.o. daily.    BPH (benign prostatic hyperplasia) Continue tamsulosin  0.4 mg p.o. daily.    Anxiety and depression Continue paroxetine  20 mg p.o. daily.    Advance Care Planning:   Code Status: Full Code   Consults:   Family Communication:   Severity of Illness: The appropriate patient status for this patient is OBSERVATION. Observation status is judged to be reasonable and necessary in order to provide the required intensity of service to ensure the patient's safety. The patient's presenting symptoms, physical exam findings, and initial radiographic and laboratory data in the context of their medical condition is felt to place them at decreased risk for further clinical deterioration. Furthermore, it is anticipated that the patient will be medically stable for discharge from the hospital within 2 midnights of admission.   Author: Alm Dorn Castor, MD 02/24/2024 12:39 PM  For on call review [email or web address].   This document was prepared using Dragon voice recognition software and may contain some unintended transcription errors.

## 2024-02-24 NOTE — ED Notes (Signed)
 Patient transported to X-ray

## 2024-02-24 NOTE — ED Triage Notes (Signed)
 Pt arrives via [facility] from [facility] for L shoulder and L hip pain. Staff at ALF said they last saw him Thursday. Pt says he got dizzy prior to falling. He is unsure if he hit his head, but there is dried blood across is L cheek. Pt not on blood thinner.    CBG 106 152/83

## 2024-02-24 NOTE — ED Provider Notes (Signed)
 [facility] Provider Note    Event Date/Time   First MD Initiated Contact with Patient 02/24/24 [phone]     (approximate)  History   Chief Complaint: Shoulder Injury, Fall, and Hip Pain  HPI  Raymond Rios is a 89 y.o. male with a past medical history of anxiety, COPD, gastric reflux, chronic left shoulder pain, hypertension, presents to the emergency department after a fall.  Patient is coming from [facility] assisted living facility after a fall.  Patient does not believe he hit his head however there is dried blood on the patient's left cheek.  Patient has been having pain in the left shoulder as well as left hip.  Patient is awake alert, denies any pain as long as he is still but states pain in the left hip and left shoulder with movement.  There is a documented history of chronic left shoulder pain.  Physical Exam   Triage Vital Signs: ED Triage Vitals  Encounter Vitals Group     BP 02/24/24 0839 (!) 143/96     Girls Systolic BP Percentile --      Girls Diastolic BP Percentile --      Boys Systolic BP Percentile --      Boys Diastolic BP Percentile --      Pulse Rate 02/24/24 0839 76     Resp 02/24/24 0839 18     Temp 02/24/24 0839 98.4 F (36.9 C)     Temp Source 02/24/24 0839 Oral     SpO2 02/24/24 0839 95 %     Weight 02/24/24 0835 178 lb 9.2 oz (81 kg)     Height 02/24/24 0835 5' 8 (1.727 m)     Head Circumference --      Peak Flow --      Pain Score --      Pain Loc --      Pain Education --      Exclude from Growth Chart --     Most recent vital signs: Vitals:   02/24/24 0839  BP: (!) 143/96  Pulse: 76  Resp: 18  Temp: 98.4 F (36.9 C)  SpO2: 95%    General: Awake, no distress.  Small amount of dried blood to the left cheek/face CV:  Good peripheral perfusion.  Regular rate and rhythm  Resp:  Normal effort.  Equal breath sounds bilaterally.  Abd:  No distention.  Soft, nontender. Other:  Mild pain with range of motion of the  left shoulder, mild to moderate pain with range of motion of the left hip.  Nontender to palpation and neurovascularly intact distally.   ED Results / Procedures / Treatments   EKG  EKG viewed and interpreted by myself shows a sinus rhythm at 68 bpm with a narrow QRS, normal axis, normal intervals, no concerning ST changes.  RADIOLOGY  I have reviewed and interpreted the hip x-ray images.  I do not appreciate any obvious fracture of the hip on my evaluation. Radiology has read the hip and shoulder x-rays as negative for acute fracture. CT scan of the head and C-spine again negative for acute traumatic injury.   MEDICATIONS ORDERED IN ED: Medications - No data to display   IMPRESSION / MDM / ASSESSMENT AND PLAN / ED COURSE  I reviewed the triage vital signs and the nursing notes.  Patient's presentation is most consistent with acute presentation with potential threat to life or bodily function.  Patient presents emergency department for a fall.  Possibly down for prolonged time and is not entirely clear.  He does not believe he hit his head however has dried blood on the left cheek we will obtain CT imaging of the head and C-spine as a precaution.  Patient is complaining of left shoulder and left hip pain.  There is a documented history of left shoulder pain however given the fall we will obtain x-ray images of the shoulder and hip/pelvis as a precaution.  Patient's lab work today shows slight LFT elevation.  Troponin slightly elevated as well at 58.  CBC is reassuring.  Imaging is negative including CT scan of the head C-spine and x-ray imaging.  Given the possible prolonged downtime we will obtain a CK level we will repeat a troponin after 2 hours we will also obtain a urine sample.  We spoke to the patient's nursing facility they only have assisted living and not skilled nursing.   Patient's remainder of his workup shows a reassuring urinalysis.  Troponin remains elevated although  largely unchanged on recheck.  Patient CK has resulted elevated at 2400.  Patient likely down for prolonged time causing rhabdomyolysis.  Patient receiving IV fluids.  LFTs were elevated but ultrasound shows no concerning finding.  Will admit to the hospital service for further workup and treatment.  FINAL CLINICAL IMPRESSION(S) / ED DIAGNOSES   Fall Left hip pain Rhabdomyolysis  Note:  This document was prepared using Dragon voice recognition software and may include unintentional dictation errors.   Dorothyann Drivers, MD 02/24/24 1225

## 2024-02-25 DIAGNOSIS — G7000 Myasthenia gravis without (acute) exacerbation: Secondary | ICD-10-CM

## 2024-02-25 DIAGNOSIS — F419 Anxiety disorder, unspecified: Secondary | ICD-10-CM

## 2024-02-25 DIAGNOSIS — R7989 Other specified abnormal findings of blood chemistry: Secondary | ICD-10-CM

## 2024-02-25 DIAGNOSIS — E876 Hypokalemia: Secondary | ICD-10-CM | POA: Insufficient documentation

## 2024-02-25 DIAGNOSIS — T796XXS Traumatic ischemia of muscle, sequela: Secondary | ICD-10-CM | POA: Diagnosis not present

## 2024-02-25 DIAGNOSIS — I5A Non-ischemic myocardial injury (non-traumatic): Secondary | ICD-10-CM | POA: Insufficient documentation

## 2024-02-25 DIAGNOSIS — J181 Lobar pneumonia, unspecified organism: Secondary | ICD-10-CM

## 2024-02-25 DIAGNOSIS — I10 Essential (primary) hypertension: Secondary | ICD-10-CM | POA: Diagnosis not present

## 2024-02-25 DIAGNOSIS — E785 Hyperlipidemia, unspecified: Secondary | ICD-10-CM

## 2024-02-25 DIAGNOSIS — I5022 Chronic systolic (congestive) heart failure: Secondary | ICD-10-CM | POA: Diagnosis not present

## 2024-02-25 DIAGNOSIS — E039 Hypothyroidism, unspecified: Secondary | ICD-10-CM

## 2024-02-25 DIAGNOSIS — I480 Paroxysmal atrial fibrillation: Secondary | ICD-10-CM

## 2024-02-25 DIAGNOSIS — R531 Weakness: Secondary | ICD-10-CM

## 2024-02-25 DIAGNOSIS — F32A Depression, unspecified: Secondary | ICD-10-CM

## 2024-02-25 DIAGNOSIS — N400 Enlarged prostate without lower urinary tract symptoms: Secondary | ICD-10-CM

## 2024-02-25 LAB — CBC
HCT: 31.6 % — ABNORMAL LOW (ref 39.0–52.0)
Hemoglobin: 10.3 g/dL — ABNORMAL LOW (ref 13.0–17.0)
MCH: 28.8 pg (ref 26.0–34.0)
MCHC: 32.6 g/dL (ref 30.0–36.0)
MCV: 88.3 fL (ref 80.0–100.0)
Platelets: 148 K/uL — ABNORMAL LOW (ref 150–400)
RBC: 3.58 MIL/uL — ABNORMAL LOW (ref 4.22–5.81)
RDW: 14.9 % (ref 11.5–15.5)
WBC: 9.2 K/uL (ref 4.0–10.5)
nRBC: 0 % (ref 0.0–0.2)

## 2024-02-25 LAB — COMPREHENSIVE METABOLIC PANEL WITH GFR
ALT: 61 U/L — ABNORMAL HIGH (ref 0–44)
AST: 107 U/L — ABNORMAL HIGH (ref 15–41)
Albumin: 2.2 g/dL — ABNORMAL LOW (ref 3.5–5.0)
Alkaline Phosphatase: 281 U/L — ABNORMAL HIGH (ref 38–126)
Anion gap: 9 (ref 5–15)
BUN: 40 mg/dL — ABNORMAL HIGH (ref 8–23)
CO2: 24 mmol/L (ref 22–32)
Calcium: 7.7 mg/dL — ABNORMAL LOW (ref 8.9–10.3)
Chloride: 103 mmol/L (ref 98–111)
Creatinine, Ser: 1.16 mg/dL (ref 0.61–1.24)
GFR, Estimated: 60 mL/min — ABNORMAL LOW (ref >60)
Glucose, Bld: 112 mg/dL — ABNORMAL HIGH (ref 70–99)
Potassium: 3.4 mmol/L — ABNORMAL LOW (ref 3.5–5.1)
Sodium: 136 mmol/L (ref 135–145)
Total Bilirubin: 0.9 mg/dL (ref 0.0–1.2)
Total Protein: 6.3 g/dL — ABNORMAL LOW (ref 6.5–8.1)

## 2024-02-25 LAB — CK: Total CK: 1605 U/L — ABNORMAL HIGH (ref 49–397)

## 2024-02-25 MED ORDER — SODIUM CHLORIDE 0.9 % IV SOLN
100.0000 mg | Freq: Two times a day (BID) | INTRAVENOUS | Status: DC
  Administered 2024-02-25 – 2024-02-26 (×2): 100 mg via INTRAVENOUS
  Filled 2024-02-25 (×3): qty 100

## 2024-02-25 MED ORDER — CARVEDILOL 3.125 MG PO TABS
3.1250 mg | ORAL_TABLET | Freq: Two times a day (BID) | ORAL | Status: DC
  Administered 2024-02-25 – 2024-02-29 (×9): 3.125 mg via ORAL
  Filled 2024-02-25 (×9): qty 1

## 2024-02-25 MED ORDER — SODIUM CHLORIDE 0.9 % IV SOLN: INTRAVENOUS | Status: DC

## 2024-02-25 MED ORDER — POTASSIUM CHLORIDE CRYS ER 20 MEQ PO TBCR
40.0000 meq | EXTENDED_RELEASE_TABLET | Freq: Once | ORAL | Status: AC
  Administered 2024-02-25: 40 meq via ORAL
  Filled 2024-02-25: qty 2

## 2024-02-25 MED ORDER — SODIUM CHLORIDE 0.9 % IV SOLN
2.0000 g | INTRAVENOUS | Status: DC
  Administered 2024-02-25: 2 g via INTRAVENOUS
  Filled 2024-02-25 (×2): qty 20

## 2024-02-25 NOTE — Assessment & Plan Note (Signed)
On Paxil 

## 2024-02-25 NOTE — Assessment & Plan Note (Signed)
 Continue to monitor

## 2024-02-25 NOTE — Assessment & Plan Note (Signed)
 Continue PPI.

## 2024-02-25 NOTE — Evaluation (Signed)
 Occupational Therapy Evaluation Patient Details Name: Raymond Rios MRN: [id number] DOB: 1931-03-19 Today's Date: 02/25/2024   History of Present Illness   Raymond Rios is a 89 y.o. male with medical history significant of seasonal allergies, anxiety, osteoarthritis, unspecified anemia, urinary hesitancy, BPH, Peyronie's disease, GERD, hypertension, hyperlipidemia, CAD, ischemic cardiomyopathy, chronic combined systolic and diastolic heart failure, palpitations, paroxysmal atrial fibrillation, myasthenia gravis, benign familial tremor, rectal bleeding, history of community-acquired pneumonia, lower extremity cramps, varicella-zoster, hypothyroidism, vitamin D deficiency who was found on the floor his assisted living facility after having a fall.     Clinical Impressions Pt was seen for OT evaluation this date. Prior to hospital admission, pt was MODI with BADL tasks, pt receives assistance from indep living staff and family for IADL tasks. Pt lives alone in his indep living facility with level entry steps. Pt presents to acute OT demonstrating impaired ADL performance, generalized weakness and functional mobility 2/2 (See OT problem list for additional functional deficits). Pt currently requires MAXA for transferring from supine<>sitting on the EOB, MODA UB dressing and MAXA LB donning of bil socks. Pt tolerated transferring via stand pivot transfer with MAXA+1 and MINA +1 (provided with son) from the EOB<>recliner, required a seated rest break before successfully retiring in recliner. Pt left with son at bedside eager for pt to continue to get stronger. Pt would benefit from skilled OT services to address noted impairments and functional limitations (see below for any additional details) in order to maximize safety and independence while minimizing falls risk and caregiver burden. OT will follow acutely.     If plan is discharge home, recommend the following:         Functional Status  Assessment   Patient has had a recent decline in their functional status and demonstrates the ability to make significant improvements in function in a reasonable and predictable amount of time.     Equipment Recommendations   Other (comment) (Next venue of care)     Recommendations for Other Services         Precautions/Restrictions   Precautions Precautions: Fall Recall of Precautions/Restrictions: Impaired Restrictions Weight Bearing Restrictions Per Provider Order: No     Mobility Bed Mobility Overal bed mobility: Needs Assistance Bed Mobility: Supine to Sit     Supine to sit: Max assist     General bed mobility comments: MAXA to come to the EOB from supine, hand placement on bedrail.    Transfers Overall transfer level: Needs assistance Equipment used: Rolling walker (2 wheels) Transfers: Sit to/from Stand, Bed to chair/wheelchair/BSC Sit to Stand: Max assist, From elevated surface Stand pivot transfers: Max assist +1, MIN +1 safety/equipment, From elevated surface         General transfer comment: SPT from EOB into recliner with one seated rest break inbetween. MAX+1 MINA +1 from son      Balance Overall balance assessment: Needs assistance Sitting-balance support: No upper extremity supported, Feet supported Sitting balance-Leahy Scale: Fair Sitting balance - Comments: Limited dynamic sitting balance, tolerated static sitting with no external support   Standing balance support: During functional activity, Reliant on assistive device for balance, Bilateral upper extremity supported Standing balance-Leahy Scale: Poor Standing balance comment: Heavy reliant on UE support during standing                           ADL either performed or assessed with clinical judgement   ADL Overall ADL's : Needs assistance/impaired Eating/Feeding:  Sitting;Minimal assistance Eating/Feeding Details (indicate cue type and reason): Positioning, set up,  assist with opening containers, often spilling on self Grooming: Wash/dry face;Minimal assistance Grooming Details (indicate cue type and reason): Aware of mess, trouble resolving clean up         Upper Body Dressing : Moderate assistance;Sitting Upper Body Dressing Details (indicate cue type and reason): Donning gown in sitting Lower Body Dressing: Maximal assistance;Sitting/lateral leans Lower Body Dressing Details (indicate cue type and reason): Donning socks in sitting Toilet Transfer: Stand-pivot;Ambulation;Rolling walker (2 wheels);BSC/3in1 Toilet Transfer Details (indicate cue type and reason): Simulated toilet t/f into recliner         Functional mobility during ADLs: Maximal assistance;Rolling walker (2 wheels);+2 for safety/equipment General ADL Comments: Son present to assist MIN +1 and MAXA +1 to step pivot to recliner from the EOB      Pertinent Vitals/Pain Pain Assessment Pain Assessment: 0-10 Pain Score: 7  Pain Descriptors / Indicators: Sore Pain Intervention(s): Limited activity within patient's tolerance, Monitored during session, Repositioned     Extremity/Trunk Assessment Upper Extremity Assessment Upper Extremity Assessment: Generalized weakness;LUE deficits/detail LUE: Unable to fully assess due to pain;Unable to fully assess due to immobilization LUE Coordination: decreased gross motor   Lower Extremity Assessment Lower Extremity Assessment: Defer to PT evaluation;Generalized weakness       Communication Communication Communication: Impaired Factors Affecting Communication: Difficulty expressing self;Hearing impaired   Cognition Arousal: Alert Behavior During Therapy: WFL for tasks assessed/performed Cognition: Cognition impaired   Orientation impairments: Situation   Memory impairment (select all impairments): Short-term memory Attention impairment (select first level of impairment): Alternating attention, Selective attention Executive  functioning impairment (select all impairments): Problem solving, Sequencing, Initiation OT - Cognition Comments: A/Ox3, delayed processing                 Following commands: Impaired Following commands impaired: Follows multi-step commands with increased time     Cueing  General Comments   Cueing Techniques: Verbal cues;Gestural cues;Tactile cues;Visual cues  Son in room to encourage pt and therapy session   Exercises Exercises: Other exercises Other Exercises Other Exercises: Edu: Role of OT eval, safe ADL completion, benefits of therapy, D/C planning   Shoulder Instructions      Home Living Family/patient expects to be discharged to:: Private residence Living Arrangements: Alone   Type of Home: Independent living facility Home Access: Level entry     Home Layout: One level     Bathroom Shower/Tub: Producer, television/film/video: Standard Bathroom Accessibility: Yes How Accessible: Accessible via walker Home Equipment: Rollator (4 wheels);Cane - single point;Shower seat - built in;Grab bars - tub/shower;Grab bars - toilet          Prior Functioning/Environment Prior Level of Function : History of Falls (last six months);Needs assist             Mobility Comments: Rollator at baseline ADLs Comments: MODI BADL, meals served at facility as well as home management tasks taken care of, family assist with IADLs    OT Problem List: Decreased strength;Decreased activity tolerance;Impaired balance (sitting and/or standing);Decreased safety awareness;Decreased cognition;Decreased knowledge of use of DME or AE   OT Treatment/Interventions: Self-care/ADL training;Therapeutic exercise;Energy conservation;DME and/or AE instruction;Patient/family education;Balance training;Therapeutic activities      OT Goals(Current goals can be found in the care plan section)   Acute Rehab OT Goals Patient Stated Goal: Go to rehab to get stronger OT Goal Formulation: With  patient/family Time For Goal Achievement: 03/10/24 Potential to Achieve  Goals: Good ADL Goals Pt Will Perform Grooming: standing;with min assist Pt Will Perform Lower Body Dressing: sit to/from stand;with mod assist Pt Will Transfer to Toilet: ambulating;with mod assist Pt Will Perform Toileting - Clothing Manipulation and hygiene: with mod assist   OT Frequency:  Min 2X/week    Co-evaluation              AM-PAC OT 6 Clicks Daily Activity     Outcome Measure Help from another person eating meals?: A Little Help from another person taking care of personal grooming?: A Lot Help from another person toileting, which includes using toliet, bedpan, or urinal?: A Lot Help from another person bathing (including washing, rinsing, drying)?: A Lot Help from another person to put on and taking off regular upper body clothing?: A Lot Help from another person to put on and taking off regular lower body clothing?: A Lot 6 Click Score: 13   End of Session Equipment Utilized During Treatment: Gait belt;Rolling walker (2 wheels) Nurse Communication: Mobility status  Activity Tolerance: Patient tolerated treatment well Patient left: in chair;with call bell/phone within reach;with chair alarm set;with nursing/sitter in room  OT Visit Diagnosis: Unsteadiness on feet (R26.81);Other abnormalities of gait and mobility (R26.89);Repeated falls (R29.6);Muscle weakness (generalized) (M62.81)                Time: 8980-8950 OT Time Calculation (min): 30 min Charges:  OT General Charges $OT Visit: 1 Visit OT Evaluation $OT Eval Low Complexity: 1 Low OT Treatments $Self Care/Home Management : 8-22 mins  Larraine Colas M.S. OTR/L  02/25/24, 11:31 AM

## 2024-02-25 NOTE — Assessment & Plan Note (Signed)
 Last EF on record 20 to 25%.   Continue low-dose Coreg . Continue home every other day Lasix 

## 2024-02-25 NOTE — Assessment & Plan Note (Signed)
 Likely from rhabdomyolysis

## 2024-02-25 NOTE — Assessment & Plan Note (Signed)
Continue low-dose Coreg

## 2024-02-25 NOTE — Hospital Course (Signed)
 89 y.o. male with medical history significant of seasonal allergies, anxiety, osteoarthritis, unspecified anemia, urinary hesitancy, BPH, Peyronie's disease, GERD, hypertension, hyperlipidemia, CAD, ischemic cardiomyopathy, chronic combined systolic and diastolic heart failure, palpitations, paroxysmal atrial fibrillation, myasthenia gravis, benign familial tremor, rectal bleeding, history of community-acquired pneumonia, lower extremity cramps, varicella-zoster, hypothyroidism, vitamin D deficiency who was found on the floor his assisted living facility after having a fall apparently at some point yesterday according to his son with complaints of left shoulder and left hip pain. He denied having any head trauma but had dried blood on his left facial area.  According to his son, his memory is impaired compared to his baseline.  He knew it was Sunday, but was otherwise disoriented to time and date.  He knows he is in the hospital in [place].  He denied headache, chest, back or abdominal pain at the moment.   7/21.  Patient stated he had a fall and could not get up for a while.  He lives by self in independent living at [facility].  Found to have rhabdomyolysis and possible pneumonia.  Continue gentle fluids, hold statin.  Start antibiotic.  Check a procalcitonin tomorrow morning.  7/23: Remained hemodynamically stable, procalcitonin negative.  Improving CK.  Awaiting SNF placement. Tramadol  was added as Tylenol  was not very helpful with his pain with ambulation secondary to fall.  7/24: Hemodynamically stable, had a bed offer-awaiting insurance authorization.  7/25: Remained hemodynamically stable, rhabdomyolysis resolved with CK at 156 today.  Rest of the labs stable.  Patient is being discharged to SNF for further rehab.  He has been given 2 more doses of Augmentin  to complete the course for pneumonia.  Patient was given some tramadol  to use if simple Tylenol  does not work for his aches and pains.   Please avoid constipation.  He will continue on current medications and need to have a close follow-up with his providers for further assistance.

## 2024-02-25 NOTE — Assessment & Plan Note (Addendum)
 With procalcitonin negative I will switch to oral Augmentin .

## 2024-02-25 NOTE — Care Management Obs Status (Signed)
 MEDICARE OBSERVATION STATUS NOTIFICATION   Patient Details  Name: Raymond Rios MRN: [id number] Date of Birth: [birth year]   Medicare Observation Status Notification Given:  Yes    Farhan Jean W, CMA 02/25/2024, 11:31 AM

## 2024-02-25 NOTE — Evaluation (Signed)
 Physical Therapy Evaluation Patient Details Name: Raymond Rios MRN: [id number] DOB: [birth year] Today's Date: 02/25/2024  History of Present Illness  Pt is a 89 y.o. male with medical history significant of seasonal allergies, anxiety, osteoarthritis, unspecified anemia, urinary hesitancy, BPH, Peyronie's disease, GERD, hypertension, hyperlipidemia, CAD, ischemic cardiomyopathy, chronic combined systolic and diastolic heart failure, palpitations, paroxysmal atrial fibrillation, myasthenia gravis, benign familial tremor, rectal bleeding, history of community-acquired pneumonia, lower extremity cramps, varicella-zoster, hypothyroidism, vitamin D deficiency who presented to the ED after having a fall with complaints of left shoulder and left hip pain. MD assessment includes rhabdomyolysis, elevated troponin, abnormal LFTs, and hepatic steatosis.  Clinical Impression  Pt was pleasant and motivated to participate during the session and put forth good effort throughout. Pt required +2 physical assistance for all bed mobility tasks and STS from elevated EOB with RW. Pt attempted to side step at EOB, but was unable to do so and sat back down immediately. Pt stated that L hip pain was limiting him from being able to ambulate despite being premedicated. Pt reported lightheadedness symptoms when coming to sitting EOB, so BP was measured and was 111/57 in sitting. Pt will benefit from continued PT services upon discharge to safely address deficits listed in patient problem list for decreased caregiver assistance and eventual return to PLOF.         If plan is discharge home, recommend the following: A lot of help with walking and/or transfers;A lot of help with bathing/dressing/bathroom;Assistance with cooking/housework;Assist for transportation   Can travel by private vehicle   No    Equipment Recommendations Other (comment) (TBD at next venue of care)  Recommendations for Other Services       Functional  Status Assessment Patient has had a recent decline in their functional status and demonstrates the ability to make significant improvements in function in a reasonable and predictable amount of time.     Precautions / Restrictions Precautions Precautions: Fall Recall of Precautions/Restrictions: Impaired Restrictions Weight Bearing Restrictions Per Provider Order: No      Mobility  Bed Mobility Overal bed mobility: Needs Assistance Bed Mobility: Supine to Sit, Sit to Supine     Supine to sit: +2 for physical assistance, Min assist, Mod assist Sit to supine: Min assist, Mod assist, +2 for physical assistance   General bed mobility comments: Pt required +2 minA-modA for all bed mobility tasks for trunk control and BLE management    Transfers Overall transfer level: Needs assistance Equipment used: Rolling walker (2 wheels) Transfers: Sit to/from Stand Sit to Stand: +2 physical assistance, Min assist, Mod assist, From elevated surface           General transfer comment: Pt required +2 minA-modA for STS from elevated EOB with RW. Pt was unable to initiate STS without physical assistance. Pt required VC's for hand placement and to lean trunk forward.    Ambulation/Gait               General Gait Details: Ambulation was attempted, but as soon as pt attempted side step at EOB, pt sat back down immediately.  Stairs            Wheelchair Mobility     Tilt Bed    Modified Rankin (Stroke Patients Only)       Balance Overall balance assessment: Needs assistance Sitting-balance support: No upper extremity supported, Feet supported Sitting balance-Leahy Scale: Fair     Standing balance support: During functional activity, Reliant on assistive device for balance,  Bilateral upper extremity supported Standing balance-Leahy Scale: Poor Standing balance comment: Pt heavily reliant on RW to maintain balance in stance, but remained steady with +2 physical  assistance                             Pertinent Vitals/Pain Pain Assessment Pain Assessment: 0-10 Pain Score: 10-Worst pain ever Pain Location: L hip; 0/10 at rest Pain Descriptors / Indicators: Constant, Pressure Pain Intervention(s): Limited activity within patient's tolerance, Monitored during session    Home Living Family/patient expects to be discharged to:: Private residence Living Arrangements: Alone Available Help at Discharge: Family;Available PRN/intermittently Type of Home: Independent living facility Home Access: Level entry       Home Layout: One level Home Equipment: Rollator (4 wheels);Cane - single point;Shower seat - built in      Prior Function Prior Level of Function : History of Falls (last six months);Independent/Modified Independent             Mobility Comments: Pt reported being modI with facility ambulation with rollator. Pt reported 1 fall in the last 6 months, occuring due to lightheadedness. ADLs Comments: Pt reported being independent with ADLs without use of AD.     Extremity/Trunk Assessment   Upper Extremity Assessment Upper Extremity Assessment: Defer to OT evaluation LUE: Unable to fully assess due to pain;Unable to fully assess due to immobilization LUE Coordination: decreased gross motor    Lower Extremity Assessment Lower Extremity Assessment: Generalized weakness;LLE deficits/detail LLE: Unable to fully assess due to pain       Communication   Communication Communication: Impaired Factors Affecting Communication: Difficulty expressing self;Hearing impaired    Cognition Arousal: Alert Behavior During Therapy: [facility] for tasks assessed/performed                             Following commands: Impaired Following commands impaired: Follows multi-step commands with increased time     Cueing Cueing Techniques: Verbal cues, Gestural cues, Tactile cues, Visual cues     General Comments General  comments (skin integrity, edema, etc.): Son in room to encourage pt and therapy session    Exercises     Assessment/Plan    PT Assessment Patient needs continued PT services  PT Problem List Decreased strength;Decreased mobility;Decreased safety awareness;Decreased range of motion;Decreased coordination;Decreased activity tolerance;Decreased balance;Pain       PT Treatment Interventions DME instruction;Therapeutic exercise;Gait training;Balance training;Functional mobility training;Therapeutic activities;Patient/family education    PT Goals (Current goals can be found in the Care Plan section)  Acute Rehab PT Goals Patient Stated Goal: to be able to ambulate without pain PT Goal Formulation: With patient Time For Goal Achievement: 03/09/24 Potential to Achieve Goals: Good    Frequency Min 2X/week     Co-evaluation               AM-PAC PT 6 Clicks Mobility  Outcome Measure Help needed turning from your back to your side while in a flat bed without using bedrails?: A Lot Help needed moving from lying on your back to sitting on the side of a flat bed without using bedrails?: A Lot Help needed moving to and from a bed to a chair (including a wheelchair)?: A Lot Help needed standing up from a chair using your arms (e.g., wheelchair or bedside chair)?: A Lot Help needed to walk in hospital room?: Total Help needed climbing 3-5 steps with a railing? :  Total 6 Click Score: 10    End of Session Equipment Utilized During Treatment: Gait belt Activity Tolerance: Patient limited by pain Patient left: in bed;with call bell/phone within reach;with bed alarm set Nurse Communication: Mobility status PT Visit Diagnosis: Unsteadiness on feet (R26.81);Difficulty in walking, not elsewhere classified (R26.2);Muscle weakness (generalized) (M62.81);Pain Pain - Right/Left: Left Pain - part of body: Hip    Time: 9085-9044 PT Time Calculation (min) (ACUTE ONLY): 41 min   Charges:                  Leontine Ingles, SPT 02/25/24, 1:45 PM

## 2024-02-25 NOTE — Assessment & Plan Note (Signed)
 Physical therapy recommending rehab

## 2024-02-25 NOTE — Assessment & Plan Note (Addendum)
 Hold statin with rhabdomyolysis

## 2024-02-25 NOTE — Assessment & Plan Note (Signed)
 Replaced

## 2024-02-25 NOTE — Assessment & Plan Note (Signed)
 Single episode in the past.  Not on anticoagulation on Coreg .  Currently sinus rhythm.

## 2024-02-25 NOTE — Assessment & Plan Note (Signed)
-  On levothyroxine

## 2024-02-25 NOTE — Assessment & Plan Note (Signed)
 On Flomax 

## 2024-02-25 NOTE — Progress Notes (Signed)
 Progress Note   Patient: Raymond Rios FMW:[id number] DOB: 1931-07-20 DOA: 02/24/2024     0 DOS: the patient was seen and examined on 02/25/2024   Brief hospital course: 89 y.o. male with medical history significant of seasonal allergies, anxiety, osteoarthritis, unspecified anemia, urinary hesitancy, BPH, Peyronie's disease, GERD, hypertension, hyperlipidemia, CAD, ischemic cardiomyopathy, chronic combined systolic and diastolic heart failure, palpitations, paroxysmal atrial fibrillation, myasthenia gravis, benign familial tremor, rectal bleeding, history of community-acquired pneumonia, lower extremity cramps, varicella-zoster, hypothyroidism, vitamin D deficiency who was found on the floor his assisted living facility after having a fall apparently at some point yesterday according to his son with complaints of left shoulder and left hip pain. He denied having any head trauma but had dried blood on his left facial area.  According to his son, his memory is impaired compared to his baseline.  He knew it was Sunday, but was otherwise disoriented to time and date.  He knows he is in the hospital in [place].  He denied headache, chest, back or abdominal pain at the moment.   7/21.  Patient stated he had a fall and could not get up for a while.  He lives by self in independent living at [facility].  Found to have rhabdomyolysis and possible pneumonia.  Continue gentle fluids, hold statin.  Start antibiotic.  Check a procalcitonin tomorrow morning.  Assessment and Plan: * Rhabdomyolysis CK 2439 on presentation down to 1605.  Will give gentle fluids 30 mL an hour.  Hold statin.  Lobar pneumonia (HCC) Will check a procalcitonin tomorrow morning start Rocephin  and doxycycline .  Chronic HFrEF (heart failure with reduced ejection fraction) (HCC) Continue low-dose Coreg .  Last EF on record 20 to 25%.  Essential hypertension Continue low-dose Coreg .  Hyperlipidemia LDL goal <70 Hold statin with  rhabdomyolysis  Hypothyroidism On levothyroxine   Paroxysmal atrial fibrillation (HCC) Single episode in the past.  Not on anticoagulation on Coreg .  Currently sinus rhythm.  GERD Continue PPI  Hypokalemia Replace potassium  Generalized weakness Physical therapy recommending rehab  Myocardial injury Likely from rhabdomyolysis  Abnormal LFTs History of hepatic steatosis.  Liver function test trending better, alkaline phosphatase 281, total bilirubin 0.9, AST 107 ALT 61  Anxiety and depression On Paxil   BPH (benign prostatic hyperplasia) On Flomax   Myasthenia gravis (HCC) Continue to monitor        Subjective: Patient feeling weak.  Physical therapy recommending rehab.  Patient had a fall.  Son concerned about patient's memory after falling.  Admitted after fall and found to have rhabdomyolysis.  Physical Exam: Vitals:   02/24/24 2021 02/24/24 2101 02/25/24 0337 02/25/24 0812  BP: (!) 85/53 (!) 90/58 126/64 128/67  Pulse: 73 74 68 67  Resp: 16 18 18 19   Temp: 98.4 F (36.9 C) 98.4 F (36.9 C) 98 F (36.7 C) (!) 97.3 F (36.3 C)  TempSrc:    Oral  SpO2: 98% 99% 97% 100%  Weight:      Height:       Physical Exam HENT:     Head: Normocephalic.  Eyes:     General: Lids are normal.     Conjunctiva/sclera: Conjunctivae normal.  Cardiovascular:     Rate and Rhythm: Normal rate and regular rhythm.     Heart sounds: Normal heart sounds, S1 normal and S2 normal.  Pulmonary:     Breath sounds: No decreased breath sounds, wheezing, rhonchi or rales.  Abdominal:     Palpations: Abdomen is soft.  Tenderness: There is no abdominal tenderness.  Musculoskeletal:     Right lower leg: No swelling.     Left lower leg: No swelling.  Skin:    General: Skin is warm.     Comments: Bruising on shoulder and hip  Neurological:     Mental Status: He is alert.     Comments: Answers questions and moves his extremities.     Data Reviewed: CK 1605, creatinine 1.16,  potassium 3.4, alkaline phosphatase 281, AST 107 ALT 61, white blood cell count 9.2, hemoglobin 10.3, platelet count 148  Family Communication: Spoke with son at the bedside  Disposition: Status is: Observation Physical therapy recommending rehab will consult transitional care team.  Start Rocephin  and doxycycline  for possible pneumonia.  Gentle fluids for rhabdomyolysis and hold statin.  Planned Discharge Destination: Rehab    Time spent: 28 minutes  Author: Charlie Patterson, MD 02/25/2024 2:50 PM  For on call review [email or web address].

## 2024-02-25 NOTE — Assessment & Plan Note (Signed)
 CK 2439 on presentation down to 699.  -Keep holding statin

## 2024-02-25 NOTE — Assessment & Plan Note (Signed)
 History of hepatic steatosis.  Liver function test trending better, alkaline phosphatase 281, total bilirubin 0.9, AST 107 ALT 61

## 2024-02-25 NOTE — Plan of Care (Signed)
  Problem: Clinical Measurements: Goal: Cardiovascular complication will be avoided Outcome: Progressing   Problem: Activity: Goal: Risk for activity intolerance will decrease Outcome: Progressing   Problem: Nutrition: Goal: Adequate nutrition will be maintained Outcome: Progressing   

## 2024-02-26 DIAGNOSIS — K219 Gastro-esophageal reflux disease without esophagitis: Secondary | ICD-10-CM

## 2024-02-26 DIAGNOSIS — E039 Hypothyroidism, unspecified: Secondary | ICD-10-CM | POA: Diagnosis not present

## 2024-02-26 DIAGNOSIS — I10 Essential (primary) hypertension: Secondary | ICD-10-CM | POA: Diagnosis not present

## 2024-02-26 DIAGNOSIS — J181 Lobar pneumonia, unspecified organism: Secondary | ICD-10-CM | POA: Diagnosis not present

## 2024-02-26 DIAGNOSIS — T796XXD Traumatic ischemia of muscle, subsequent encounter: Secondary | ICD-10-CM

## 2024-02-26 DIAGNOSIS — I5022 Chronic systolic (congestive) heart failure: Secondary | ICD-10-CM | POA: Diagnosis not present

## 2024-02-26 DIAGNOSIS — R7989 Other specified abnormal findings of blood chemistry: Secondary | ICD-10-CM | POA: Diagnosis not present

## 2024-02-26 DIAGNOSIS — K760 Fatty (change of) liver, not elsewhere classified: Secondary | ICD-10-CM

## 2024-02-26 DIAGNOSIS — E785 Hyperlipidemia, unspecified: Secondary | ICD-10-CM | POA: Diagnosis not present

## 2024-02-26 DIAGNOSIS — E876 Hypokalemia: Secondary | ICD-10-CM | POA: Diagnosis not present

## 2024-02-26 DIAGNOSIS — T796XXS Traumatic ischemia of muscle, sequela: Secondary | ICD-10-CM | POA: Diagnosis not present

## 2024-02-26 DIAGNOSIS — I480 Paroxysmal atrial fibrillation: Secondary | ICD-10-CM | POA: Diagnosis not present

## 2024-02-26 DIAGNOSIS — R531 Weakness: Secondary | ICD-10-CM | POA: Diagnosis not present

## 2024-02-26 LAB — BASIC METABOLIC PANEL WITH GFR
Anion gap: 6 (ref 5–15)
BUN: 33 mg/dL — ABNORMAL HIGH (ref 8–23)
CO2: 27 mmol/L (ref 22–32)
Calcium: 8 mg/dL — ABNORMAL LOW (ref 8.9–10.3)
Chloride: 105 mmol/L (ref 98–111)
Creatinine, Ser: 0.87 mg/dL (ref 0.61–1.24)
GFR, Estimated: >60 mL/min (ref >60)
Glucose, Bld: 100 mg/dL — ABNORMAL HIGH (ref 70–99)
Potassium: 3.9 mmol/L (ref 3.5–5.1)
Sodium: 138 mmol/L (ref 135–145)

## 2024-02-26 LAB — PROCALCITONIN: Procalcitonin: <0.1 ng/mL

## 2024-02-26 LAB — CBC
HCT: 33.3 % — ABNORMAL LOW (ref 39.0–52.0)
Hemoglobin: 10.8 g/dL — ABNORMAL LOW (ref 13.0–17.0)
MCH: 28.5 pg (ref 26.0–34.0)
MCHC: 32.4 g/dL (ref 30.0–36.0)
MCV: 87.9 fL (ref 80.0–100.0)
Platelets: 152 K/uL (ref 150–400)
RBC: 3.79 MIL/uL — ABNORMAL LOW (ref 4.22–5.81)
RDW: 15 % (ref 11.5–15.5)
WBC: 8 K/uL (ref 4.0–10.5)
nRBC: 0 % (ref 0.0–0.2)

## 2024-02-26 LAB — CK: Total CK: 699 U/L — ABNORMAL HIGH (ref 49–397)

## 2024-02-26 MED ORDER — AMOXICILLIN-POT CLAVULANATE 875-125 MG PO TABS
1.0000 | ORAL_TABLET | Freq: Two times a day (BID) | ORAL | Status: DC
  Administered 2024-02-26 – 2024-02-29 (×6): 1 via ORAL
  Filled 2024-02-26 (×6): qty 1

## 2024-02-26 MED ORDER — AMOXICILLIN-POT CLAVULANATE 875-125 MG PO TABS: 1.0000 | ORAL_TABLET | Freq: Two times a day (BID) | ORAL | Status: DC

## 2024-02-26 MED ORDER — FUROSEMIDE 20 MG PO TABS
20.0000 mg | ORAL_TABLET | ORAL | Status: DC
  Administered 2024-02-26 – 2024-02-28 (×2): 20 mg via ORAL
  Filled 2024-02-26 (×3): qty 1

## 2024-02-26 NOTE — Plan of Care (Signed)

## 2024-02-26 NOTE — Progress Notes (Signed)
 Physical Therapy Treatment Patient Details Name: Raymond Rios MRN: [id number] DOB: 1931/02/22 Today's Date: 02/26/2024   History of Present Illness Pt is a 89 y.o. male with medical history significant of seasonal allergies, anxiety, osteoarthritis, unspecified anemia, urinary hesitancy, BPH, Peyronie's disease, GERD, hypertension, hyperlipidemia, CAD, ischemic cardiomyopathy, chronic combined systolic and diastolic heart failure, palpitations, paroxysmal atrial fibrillation, myasthenia gravis, benign familial tremor, rectal bleeding, history of community-acquired pneumonia, lower extremity cramps, varicella-zoster, hypothyroidism, vitamin D deficiency who presented to the ED after having a fall with complaints of left shoulder and left hip pain. MD assessment includes rhabdomyolysis, elevated troponin, abnormal LFTs, and hepatic steatosis.    PT Comments  Pt was pleasant and motivated to participate during the session and put forth good effort throughout. Pt required +2 physical assistance for all mobility tasks during today's visit. Pt was able to attempt side stepping at EOB towards the R. Pt was unable to clear BLE when side stepping, so pt shuffled feet towards the R. Pt was only able to shuffle feet twice before needing to sit, due to pain. Pt reported no adverse symptoms during the session other than L hip pain with SpO2 and HR WNL throughout on room air. Pt will benefit from continued PT services upon discharge to safely address deficits listed in patient problem list for decreased caregiver assistance and eventual return to PLOF.   If plan is discharge home, recommend the following: A lot of help with walking and/or transfers;A lot of help with bathing/dressing/bathroom;Assistance with cooking/housework;Assist for transportation   Can travel by private vehicle     No  Equipment Recommendations  Other (comment) (TBD at next venue of care)    Recommendations for Other Services        Precautions / Restrictions Precautions Precautions: Fall Recall of Precautions/Restrictions: Impaired Restrictions Weight Bearing Restrictions Per Provider Order: No     Mobility  Bed Mobility Overal bed mobility: Needs Assistance Bed Mobility: Supine to Sit, Sit to Supine     Supine to sit: +2 for physical assistance, Min assist, Mod assist Sit to supine: Min assist, Mod assist, +2 for physical assistance   General bed mobility comments: Pt required +2 minA-modA for all bed mobility tasks for trunk control and BLE management    Transfers Overall transfer level: Needs assistance Equipment used: Rolling walker (2 wheels) Transfers: Sit to/from Stand Sit to Stand: +2 physical assistance, Min assist, Mod assist, From elevated surface           General transfer comment: Pt required +2 minA-modA for STS from elevated EOB with RW. Pt required VC's for hand placement and to lean trunk forward before initiating STS.    Ambulation/Gait Ambulation/Gait assistance: Min assist, +2 physical assistance Gait Distance (Feet): 3 Feet           General Gait Details: Pt did lateral steps to R at EOB, pt required +2 minA to prevent posterior LOB, but remained steady throughout. Pt demonstrated difficulty clearing bilat feet when side stepping, as pt shuffled feet towards R. Pt also required minA to shift RW towards R.    Stairs             Wheelchair Mobility     Tilt Bed    Modified Rankin (Stroke Patients Only)       Balance Overall balance assessment: Needs assistance Sitting-balance support: No upper extremity supported, Feet supported Sitting balance-Leahy Scale: Fair     Standing balance support: During functional activity, Reliant on assistive device for  balance, Bilateral upper extremity supported Standing balance-Leahy Scale: Poor Standing balance comment: Pt heavily reliant on RW to maintain balance in stance, but remained steady with +2 physical  assistance                            Communication Communication Communication: Impaired Factors Affecting Communication: Difficulty expressing self;Hearing impaired  Cognition Arousal: Alert Behavior During Therapy: [facility] for tasks assessed/performed                             Following commands: Impaired Following commands impaired: Follows multi-step commands with increased time    Cueing Cueing Techniques: Verbal cues, Gestural cues, Tactile cues, Visual cues  Exercises Total Joint Exercises Long Arc Quad: AROM, Both, 10 reps Static standing EOB 1x 5 min, 1 x 1 min to improve activity tolerance    General Comments        Pertinent Vitals/Pain Pain Assessment Pain Assessment: 0-10 Pain Score: 7  Pain Location: L hip; 0/10 at rest Pain Descriptors / Indicators: Constant, Pressure Pain Intervention(s): Limited activity within patient's tolerance, Monitored during session    Home Living                          Prior Function            PT Goals (current goals can now be found in the care plan section) Progress towards PT goals: Progressing toward goals    Frequency    Min 2X/week      PT Plan      Co-evaluation              AM-PAC PT 6 Clicks Mobility   Outcome Measure  Help needed turning from your back to your side while in a flat bed without using bedrails?: A Lot Help needed moving from lying on your back to sitting on the side of a flat bed without using bedrails?: A Lot Help needed moving to and from a bed to a chair (including a wheelchair)?: A Lot Help needed standing up from a chair using your arms (e.g., wheelchair or bedside chair)?: A Lot Help needed to walk in hospital room?: Total Help needed climbing 3-5 steps with a railing? : Total 6 Click Score: 10    End of Session Equipment Utilized During Treatment: Gait belt Activity Tolerance: Patient limited by pain Patient left: in bed;with call  bell/phone within reach;with bed alarm set Nurse Communication: Mobility status PT Visit Diagnosis: Unsteadiness on feet (R26.81);Difficulty in walking, not elsewhere classified (R26.2);Muscle weakness (generalized) (M62.81);Pain Pain - Right/Left: Left Pain - part of body: Hip     Time: 8361-8293 PT Time Calculation (min) (ACUTE ONLY): 28 min  Charges:                            Leontine Ingles, SPT 02/26/24, 5:27 PM

## 2024-02-26 NOTE — Progress Notes (Signed)
 Progress Note   Patient: Raymond Rios FMW:[id number] DOB: [birth year] DOA: 02/24/2024     0 DOS: the patient was seen and examined on 02/26/2024   Brief hospital course: 89 y.o. male with medical history significant of seasonal allergies, anxiety, osteoarthritis, unspecified anemia, urinary hesitancy, BPH, Peyronie's disease, GERD, hypertension, hyperlipidemia, CAD, ischemic cardiomyopathy, chronic combined systolic and diastolic heart failure, palpitations, paroxysmal atrial fibrillation, myasthenia gravis, benign familial tremor, rectal bleeding, history of community-acquired pneumonia, lower extremity cramps, varicella-zoster, hypothyroidism, vitamin D deficiency who was found on the floor his assisted living facility after having a fall apparently at some point yesterday according to his son with complaints of left shoulder and left hip pain. He denied having any head trauma but had dried blood on his left facial area.  According to his son, his memory is impaired compared to his baseline.  He knew it was Sunday, but was otherwise disoriented to time and date.  He knows he is in the hospital in [place].  He denied headache, chest, back or abdominal pain at the moment.   7/21.  Patient stated he had a fall and could not get up for a while.  He lives by self in independent living at [facility].  Found to have rhabdomyolysis and possible pneumonia.  Continue gentle fluids, hold statin.  Start antibiotic.  Check a procalcitonin tomorrow morning.  Assessment and Plan: * Rhabdomyolysis CK 2439 on presentation down to 699.  Hold statin.  Lobar pneumonia (HCC) With procalcitonin negative I will switch to oral Augmentin .  Chronic HFrEF (heart failure with reduced ejection fraction) (HCC) Continue low-dose Coreg .  Last EF on record 20 to 25%.  Restart every other day Lasix .  Essential hypertension Continue low-dose Coreg .  Hyperlipidemia LDL goal <70 Hold statin with  rhabdomyolysis  Hypothyroidism On levothyroxine   Paroxysmal atrial fibrillation (HCC) Single episode in the past.  Not on anticoagulation on Coreg .  Currently sinus rhythm.  GERD Continue PPI  Hypokalemia Replace potassium  Generalized weakness Physical therapy recommending rehab  Myocardial injury Likely from rhabdomyolysis  Abnormal LFTs History of hepatic steatosis.  Liver function test trending better, alkaline phosphatase 281, total bilirubin 0.9, AST 107 ALT 61  Anxiety and depression On Paxil   BPH (benign prostatic hyperplasia) On Flomax   Myasthenia gravis (HCC) Continue to monitor        Subjective: Patient feels weak and worn out.  Admitted after a fall and found to have rhabdomyolysis.  Physical Exam: Vitals:   02/26/24 0452 02/26/24 0753 02/26/24 1439 02/26/24 1449  BP: (!) 147/67 (!) 143/81 122/72 (!) 121/54  Pulse: 67 69 77 96  Resp: 18 20  16   Temp: (!) 97.5 F (36.4 C) 97.9 F (36.6 C) 98.1 F (36.7 C) 98 F (36.7 C)  TempSrc:      SpO2: 95% 96% 96% 97%  Weight:      Height:       Physical Exam HENT:     Head: Normocephalic.  Eyes:     General: Lids are normal.     Conjunctiva/sclera: Conjunctivae normal.  Cardiovascular:     Rate and Rhythm: Normal rate and regular rhythm.     Heart sounds: Normal heart sounds, S1 normal and S2 normal.  Pulmonary:     Breath sounds: No decreased breath sounds, wheezing, rhonchi or rales.  Abdominal:     Palpations: Abdomen is soft.     Tenderness: There is no abdominal tenderness.  Musculoskeletal:     Right lower leg: No swelling.  Left lower leg: No swelling.  Skin:    General: Skin is warm.     Comments: Bruising on shoulder and hip  Neurological:     Mental Status: He is alert.     Comments: Answers questions and moves his extremities.     Data Reviewed: Creatinine 0.87, sodium 138, potassium 3.9, CK6 99, white blood cell count 8.0, hemoglobin 10.8, platelet count 152,  procalcitonin less than 0.10  Family Communication: Spoke with son on the phone  Disposition: Status is: Observation Physical therapy recommended rehab.  Patient at independent living.  TOC to look into rehabs  Planned Discharge Destination: Rehab    Time spent: 28 minutes Spoke with transitional care team Author: Charlie Patterson, MD 02/26/2024 2:57 PM  For on call review [email or web address].

## 2024-02-27 DIAGNOSIS — W19XXXA Unspecified fall, initial encounter: Secondary | ICD-10-CM | POA: Diagnosis not present

## 2024-02-27 DIAGNOSIS — E876 Hypokalemia: Secondary | ICD-10-CM

## 2024-02-27 DIAGNOSIS — K219 Gastro-esophageal reflux disease without esophagitis: Secondary | ICD-10-CM

## 2024-02-27 DIAGNOSIS — M6282 Rhabdomyolysis: Secondary | ICD-10-CM | POA: Diagnosis not present

## 2024-02-27 DIAGNOSIS — I5A Non-ischemic myocardial injury (non-traumatic): Secondary | ICD-10-CM

## 2024-02-27 DIAGNOSIS — J181 Lobar pneumonia, unspecified organism: Secondary | ICD-10-CM | POA: Diagnosis not present

## 2024-02-27 DIAGNOSIS — I5022 Chronic systolic (congestive) heart failure: Secondary | ICD-10-CM | POA: Diagnosis not present

## 2024-02-27 MED ORDER — TRAMADOL HCL 50 MG PO TABS
50.0000 mg | ORAL_TABLET | Freq: Four times a day (QID) | ORAL | Status: DC | PRN
  Administered 2024-02-27 – 2024-02-28 (×2): 50 mg via ORAL
  Filled 2024-02-27 (×2): qty 1

## 2024-02-27 NOTE — TOC Progression Note (Signed)
 Transition of Care [facility]) - Progression Note    Patient Details  Name: Raymond Rios MRN: [id number] Date of Birth: Jan 22, 1931  Transition of [facility]) CM/SW Contact  [name], RN 02/27/2024, 5:17 PM  Clinical Narrative:     The above named patient is recommended to go to Short Term Rehab for strengthening and gait training for balance.  It is expected that the Short Term Rehab stay will be less than 30 days.  The patient is expected to return home after Rehab.   Expected Discharge Plan: Skilled Nursing Facility Barriers to Discharge: Continued Medical Work up    Expected Discharge Plan and Services   SNF     Living arrangements for the past 2 months: Apartment  Social Drivers of Health (SDOH) Interventions SDOH Screenings   Food Insecurity: No Food Insecurity (02/24/2024)  Housing: Low Risk  (02/24/2024)  Transportation Needs: No Transportation Needs (02/24/2024)  Utilities: Not At Risk (02/24/2024)  Social Connections: Unknown (02/24/2024)  Tobacco Use: Medium Risk (01/16/2024)    Readmission Risk Interventions     No data to display

## 2024-02-27 NOTE — Progress Notes (Signed)
 Progress Note   Patient: Raymond Rios FMW:[id number] DOB: [birth year] DOA: 02/24/2024     0 DOS: the patient was seen and examined on 02/27/2024   Brief hospital course: 89 y.o. male with medical history significant of seasonal allergies, anxiety, osteoarthritis, unspecified anemia, urinary hesitancy, BPH, Peyronie's disease, GERD, hypertension, hyperlipidemia, CAD, ischemic cardiomyopathy, chronic combined systolic and diastolic heart failure, palpitations, paroxysmal atrial fibrillation, myasthenia gravis, benign familial tremor, rectal bleeding, history of community-acquired pneumonia, lower extremity cramps, varicella-zoster, hypothyroidism, vitamin D deficiency who was found on the floor his assisted living facility after having a fall apparently at some point yesterday according to his son with complaints of left shoulder and left hip pain. He denied having any head trauma but had dried blood on his left facial area.  According to his son, his memory is impaired compared to his baseline.  He knew it was Sunday, but was otherwise disoriented to time and date.  He knows he is in the hospital in [place].  He denied headache, chest, back or abdominal pain at the moment.   7/21.  Patient stated he had a fall and could not get up for a while.  He lives by self in independent living at [facility].  Found to have rhabdomyolysis and possible pneumonia.  Continue gentle fluids, hold statin.  Start antibiotic.  Check a procalcitonin tomorrow morning.  7/23: Remained hemodynamically stable, procalcitonin negative.  Improving CK.  Awaiting SNF placement. Tramadol  was added as Tylenol  was not very helpful with his pain with ambulation secondary to fall.  Assessment and Plan: * Rhabdomyolysis CK 2439 on presentation down to 699.  -Keep holding statin  Lobar pneumonia (HCC) With procalcitonin negative  Continue Augmentin  to complete the course  Chronic HFrEF (heart failure with reduced ejection fraction)  (HCC)  Last EF on record 20 to 25%.   Continue low-dose Coreg . Continue home every other day Lasix   Essential hypertension Continue low-dose Coreg .  Hyperlipidemia LDL goal <70 Hold statin with rhabdomyolysis  Hypothyroidism On levothyroxine   Paroxysmal atrial fibrillation (HCC) Single episode in the past.  Not on anticoagulation on Coreg .  Currently sinus rhythm.  GERD Continue PPI  Hypokalemia Resolved with repletion -Continue to monitor  Generalized weakness Physical therapy recommending rehab  Myocardial injury Likely from rhabdomyolysis  Abnormal LFTs History of hepatic steatosis.  Liver function test trending better, alkaline phosphatase 281, total bilirubin 0.9, AST 107 ALT 61  Anxiety and depression On Paxil   BPH (benign prostatic hyperplasia) On Flomax   Myasthenia gravis (HCC) Continue to monitor   Subjective: Patient was sitting comfortably in chair when seen today.  He was complaining of left-sided body aches due to fall which interfere with his ambulation.  Tylenol  was not very helpful.  Physical Exam: Vitals:   02/26/24 2010 02/27/24 0312 02/27/24 0743 02/27/24 1441  BP: 135/63 (!) 147/81 (!) 140/58 131/75  Pulse: 68 73 67 70  Resp: 18 18 17 17   Temp: 97.8 F (36.6 C) 98.1 F (36.7 C) 98.3 F (36.8 C) 97.7 F (36.5 C)  TempSrc: Oral   Oral  SpO2: 97% 96% 96% 96%  Weight:      Height:       General.  Frail elderly man, in no acute distress. Pulmonary.  Lungs clear bilaterally, normal respiratory effort. CV.  Regular rate and rhythm, no JVD, rub or murmur. Abdomen.  Soft, nontender, nondistended, BS positive. CNS.  Alert and oriented .  No focal neurologic deficit. Extremities.  No edema, no cyanosis, pulses intact  and symmetrical. Psychiatry.  Judgment and insight appears normal.    Data Reviewed: Prior data reviewed  Family Communication: Discussed with son at bedside  Disposition: Status is: Observation Physical therapy  recommended rehab.  Patient at independent living.  TOC to look into rehabs  Planned Discharge Destination: Rehab  DVT prophylaxis.  Lovenox  Time spent: 45 minutes  Author: Amaryllis Dare, MD 02/27/2024 5:26 PM  For on call review [email or web address].

## 2024-02-27 NOTE — Plan of Care (Signed)

## 2024-02-27 NOTE — TOC Initial Note (Addendum)
 Transition of Care [facility]) - Initial/Assessment Note    Patient Details  Name: Raymond Rios MRN: [id number] Date of Birth: November 15, 1930  Transition of Care [facility]) CM/SW Contact:    [name], RN 02/27/2024, 4:39 PM  Clinical Narrative:                  CM call to patient's son, [name], phone: [phone] regarding case management screening. No answer, CM left message for return call. CM call to patient's daughter, Tully , phone: [phone], no answer, CM left message for return call. CM call to [facility]. CM spoke to Halona. Per Halona, patient uses a walker. CM to patient's room regarding case management screening assessment. Patient states has lived at [facility] almost 5 years. Per patient uses a walker. Per patient, no previous home health or SNF experience. CM and patient discussed SNF recommendations, patient is agreeable to SNF recommendations.   CM will complete SNF workup.   CM initiated PASSR screening in [place] MUST. PASSR approved, PASSR is [id number], reference number is [id number].  CM faxed SNF referral to [facility], [facility] and [facility].  Barriers to Discharge: Continued Medical Work up   Patient Goals and CMS Choice    SNF  Expected Discharge Plan and Services    SNF   Living arrangements for the past 2 months: Apartment                   Prior Living Arrangements/Services Living arrangements for the past 2 months: Apartment Lives with:: Self (Lives at [facility]) Patient language and need for interpreter reviewed:: No        Need for Family Participation in Patient Care: Yes (Comment) Care giver support system in place?: Yes (comment) Current home services: DME (walker) Criminal Activity/Legal Involvement Pertinent to Current Situation/Hospitalization: No - Comment as needed  Activities of Daily Living   ADL Screening (condition at time of admission) Independently performs ADLs?: No Does the  patient have a NEW difficulty with bathing/dressing/toileting/self-feeding that is expected to last >3 days?: Yes (Initiates electronic notice to provider for possible OT consult) Does the patient have a NEW difficulty with getting in/out of bed, walking, or climbing stairs that is expected to last >3 days?: Yes (Initiates electronic notice to provider for possible PT consult) Does the patient have a NEW difficulty with communication that is expected to last >3 days?: No Is the patient deaf or have difficulty hearing?: No Does the patient have difficulty seeing, even when wearing glasses/contacts?: No Does the patient have difficulty concentrating, remembering, or making decisions?: No  Permission Sought/Granted Permission sought to share information with : Case Manager, Family Supports Permission granted to share information with : Yes, Verbal Permission Granted  Share Information with NAME: [name] Picardi/Kristy Stoval     Permission granted to share info w Relationship: Son/Daughter     Emotional Assessment Appearance:: Appears stated age Attitude/Demeanor/Rapport: Engaged Affect (typically observed): Calm Orientation: : Oriented to Self, Oriented to Place, Oriented to Situation Alcohol / Substance Use: Not Applicable    Admission diagnosis:  Rhabdomyolysis [M62.82] Fall, initial encounter [W19.XXXA] Non-traumatic rhabdomyolysis [M62.82] Patient Active Problem List   Diagnosis Date Noted   Lobar pneumonia (HCC) 02/25/2024   Myocardial injury 02/25/2024   Generalized weakness 02/25/2024   Hypokalemia 02/25/2024   Rhabdomyolysis 02/24/2024   Hepatic steatosis 02/24/2024   Abnormal LFTs 02/24/2024   Senile purpura (HCC) 12/28/2022   COPD with acute exacerbation (HCC)  11/10/2022   CAP (community acquired pneumonia) 11/10/2022   Acute on chronic combined systolic and diastolic CHF (congestive heart failure) (HCC) 11/10/2022   Dyslipidemia 11/10/2022   Hypothyroidism 11/10/2022    Coronary artery disease 11/10/2022   BPH (benign prostatic hyperplasia) 11/10/2022   Anxiety and depression 11/10/2022   Orthostatic hypotension 07/08/2022   Syncope 07/08/2022   Anemia 12/27/2021   Arthritis 12/27/2021   Depression 12/27/2021   History of chickenpox 12/27/2021   History of colon polyps 12/27/2021   Myasthenia gravis (HCC) 12/27/2021   Urine incontinence 12/27/2021   Palpitations 07/02/2020   Tremor 05/16/2018   Coronary artery disease involving native coronary artery of native heart without angina pectoris 12/06/2017   Chronic HFrEF (heart failure with reduced ejection fraction) (HCC) 12/06/2017   Ischemic cardiomyopathy 12/06/2017   Paroxysmal atrial fibrillation (HCC) 12/06/2017   Need for shingles vaccine 02/16/2014   Routine general medical examination at a health care facility 11/19/2013   Hesitancy 11/19/2013   Screening for prostate cancer 11/19/2013   Hyperlipidemia LDL goal <70 10/30/2013   HTN (hypertension) 10/30/2013   Cramp in limb 10/30/2013   Vitamin D deficiency 04/29/2009   GERD 10/14/2008   ECZEMA 10/14/2008   Allergy 11/08/2007   SHOULDER PAIN, LEFT, CHRONIC 12/05/2006   PEYRONIE'S DISEASE 11/15/2006   RECTAL BLEEDING, HX OF 11/15/2006   Essential hypertension 12/05/2001   HYPERLIPIDEMIA 08/07/1993   Other specified chronic obstructive pulmonary disease (HCC) 08/07/1988   Anxiety state 08/07/1948   PCP:  [facility]:   [facility] [address], [place] - [address] [place] [address] Phone: [phone] Fax: [phone]  [facility] - [place], [place] - [address] [place] [address] Phone: [phone] Fax: [phone]  [id number] [place], [place] - [address] [place] [address] Phone: [phone] Fax: [phone]     Social Drivers of Health (SDOH) Social History: SDOH Screenings   Food  Insecurity: No Food Insecurity (02/24/2024)  Housing: Low Risk  (02/24/2024)  Transportation Needs: No Transportation Needs (02/24/2024)  Utilities: Not At Risk (02/24/2024)  Social Connections: Unknown (02/24/2024)  Tobacco Use: Medium Risk (01/16/2024)   SDOH Interventions:     Readmission Risk Interventions     No data to display

## 2024-02-27 NOTE — NC FL2 (Signed)
 [place]  MEDICAID FL2 LEVEL OF CARE FORM     IDENTIFICATION  Patient Name: Raymond Rios Birthdate: [birth year] Sex: male Admission Date (Current Location): 02/24/2024  [place] and [place] Number:  [employer] and Address:  [facility], [address], [place], [place] [address]      Provider Number: [id number]  Attending Physician Name and Address:  Caleen Qualia, MD  Relative Name and Phone Number:  Dvon Jiles, son, phone: [phone]; Tully Boehringer, daughter, phone: [phone]    Current Level of Care: Hospital Recommended Level of Care: Skilled Nursing Facility Prior Approval Number:    Date Approved/Denied:   PASRR Number:    Discharge Plan: SNF    Current Diagnoses: Patient Active Problem List   Diagnosis Date Noted   Lobar pneumonia (HCC) 02/25/2024   Myocardial injury 02/25/2024   Generalized weakness 02/25/2024   Hypokalemia 02/25/2024   Rhabdomyolysis 02/24/2024   Hepatic steatosis 02/24/2024   Abnormal LFTs 02/24/2024   Senile purpura (HCC) 12/28/2022   COPD with acute exacerbation (HCC) 11/10/2022   CAP (community acquired pneumonia) 11/10/2022   Acute on chronic combined systolic and diastolic CHF (congestive heart failure) (HCC) 11/10/2022   Dyslipidemia 11/10/2022   Hypothyroidism 11/10/2022   Coronary artery disease 11/10/2022   BPH (benign prostatic hyperplasia) 11/10/2022   Anxiety and depression 11/10/2022   Orthostatic hypotension 07/08/2022   Syncope 07/08/2022   Anemia 12/27/2021   Arthritis 12/27/2021   Depression 12/27/2021   History of chickenpox 12/27/2021   History of colon polyps 12/27/2021   Myasthenia gravis (HCC) 12/27/2021   Urine incontinence 12/27/2021   Palpitations 07/02/2020   Tremor 05/16/2018   Coronary artery disease involving native coronary artery of native heart without angina pectoris 12/06/2017   Chronic HFrEF (heart failure with reduced ejection fraction) (HCC) 12/06/2017    Ischemic cardiomyopathy 12/06/2017   Paroxysmal atrial fibrillation (HCC) 12/06/2017   Need for shingles vaccine 02/16/2014   Routine general medical examination at a health care facility 11/19/2013   Hesitancy 11/19/2013   Screening for prostate cancer 11/19/2013   Hyperlipidemia LDL goal <70 10/30/2013   HTN (hypertension) 10/30/2013   Cramp in limb 10/30/2013   Vitamin D deficiency 04/29/2009   GERD 10/14/2008   ECZEMA 10/14/2008   Allergy 11/08/2007   SHOULDER PAIN, LEFT, CHRONIC 12/05/2006   PEYRONIE'S DISEASE 11/15/2006   RECTAL BLEEDING, HX OF 11/15/2006   Essential hypertension 12/05/2001   HYPERLIPIDEMIA 08/07/1993   Other specified chronic obstructive pulmonary disease (HCC) 08/07/1988   Anxiety state 08/07/1948    Orientation RESPIRATION BLADDER Height & Weight     Self, Time, Situation, Place  Normal Incontinent Weight: 81 kg Height:  5' 8 (172.7 cm)  BEHAVIORAL SYMPTOMS/MOOD NEUROLOGICAL BOWEL NUTRITION STATUS      Incontinent  (Please see discharge summary)  AMBULATORY STATUS COMMUNICATION OF NEEDS Skin   Limited Assist Verbally  (Dry, Ecchymosis on left elbow, left hip, heel; non-tenting)                       Personal Care Assistance Level of Assistance  Bathing, Feeding, Dressing Bathing Assistance: Limited assistance Feeding assistance: Limited assistance Dressing Assistance: Limited assistance     Functional Limitations Info             SPECIAL CARE FACTORS FREQUENCY  PT (By licensed PT), OT (By licensed OT)     PT Frequency: 5 x per week OT Frequency: 5 x per week  Contractures      Additional Factors Info  Code Status, Allergies Code Status Info: DNR Allergies Info: Other  Medium - Hives  Sulfonamide Derivatives  No severity or reactions specified           Current Medications (02/27/2024):  This is the current hospital active medication list Current Facility-Administered Medications  Medication Dose Route  Frequency Provider Last Rate Last Admin   acetaminophen  (TYLENOL ) tablet 650 mg  650 mg Oral Q6H PRN Celinda Alm Lot, MD   650 mg at 02/26/24 1517   Or   acetaminophen  (TYLENOL ) suppository 650 mg  650 mg Rectal Q6H PRN Celinda Alm Lot, MD       albuterol  (PROVENTIL ) (2.5 MG/3ML) 0.083% nebulizer solution 2.5 mg  2.5 mg Nebulization Q4H PRN Celinda Alm Lot, MD   2.5 mg at 02/24/24 1640   amoxicillin -clavulanate (AUGMENTIN ) 875-125 MG per tablet 1 tablet  1 tablet Oral Q12H Josette Ade, MD   1 tablet at 02/27/24 9176   aspirin  EC tablet 81 mg  81 mg Oral Daily Celinda Alm Lot, MD   81 mg at 02/27/24 9176   carvedilol  (COREG ) tablet 3.125 mg  3.125 mg Oral BID WC Josette Ade, MD   3.125 mg at 02/27/24 1654   enoxaparin  (LOVENOX ) injection 40 mg  40 mg Subcutaneous Q24H Celinda Alm Lot, MD   40 mg at 02/26/24 2138   furosemide  (LASIX ) tablet 20 mg  20 mg Oral QODAY Wieting, Richard, MD   20 mg at 02/26/24 1119   levothyroxine  (SYNTHROID ) tablet 75 mcg  75 mcg Oral q morning Celinda Alm Lot, MD   75 mcg at 02/27/24 9370   ondansetron  (ZOFRAN ) tablet 4 mg  4 mg Oral Q6H PRN Celinda Alm Lot, MD       Or   ondansetron  (ZOFRAN ) injection 4 mg  4 mg Intravenous Q6H PRN Celinda Alm Lot, MD       pantoprazole  (PROTONIX ) EC tablet 40 mg  40 mg Oral Daily Celinda Alm Lot, MD   40 mg at 02/27/24 9176   PARoxetine  (PAXIL ) tablet 20 mg  20 mg Oral Daily Celinda Alm Lot, MD   20 mg at 02/27/24 9176   tamsulosin  (FLOMAX ) capsule 0.4 mg  0.4 mg Oral Daily Celinda Alm Lot, MD   0.4 mg at 02/27/24 9176   traMADol  (ULTRAM ) tablet 50 mg  50 mg Oral Q6H PRN Amin, Sumayya, MD   50 mg at 02/27/24 1329     Discharge Medications: Please see discharge summary for a list of discharge medications.  Relevant Imaging Results:  Relevant Lab Results:   Additional Information SSN:[id number]  [name], RN

## 2024-02-27 NOTE — Progress Notes (Signed)
 Occupational Therapy Treatment Patient Details Name: Raymond Rios MRN: [id number] DOB: 24-Jun-1931 Today's Date: 02/27/2024   History of present illness Pt is a 89 y.o. male with medical history significant of seasonal allergies, anxiety, osteoarthritis, unspecified anemia, urinary hesitancy, BPH, Peyronie's disease, GERD, hypertension, hyperlipidemia, CAD, ischemic cardiomyopathy, chronic combined systolic and diastolic heart failure, palpitations, paroxysmal atrial fibrillation, myasthenia gravis, benign familial tremor, rectal bleeding, history of community-acquired pneumonia, lower extremity cramps, varicella-zoster, hypothyroidism, vitamin D deficiency who presented to the ED after having a fall with complaints of left shoulder and left hip pain. MD assessment includes rhabdomyolysis, elevated troponin, abnormal LFTs, and hepatic steatosis.   OT comments  Pt is supine in bed on arrival. Easily arousable and agreeable to OT session. He does not have pain at rest, but up to 8/10 with activity/movement and weight bearing in LLE and LUE. Pt performed bed mobility with Mod/Max A x1 and cueing for sequencing and intiation. Pt required Mod A x2 for STS from elevated bed height and for SPT to recliner d/t inability to clear RLE from floor d/t poor weight bearing tolerance through LLE from pain. Pt with small shuffling steps to recliner with RLE buckling at times and increased time. Bathing and dressing performed in recliner with supervision and cueing for initation for UB ADLs and Mod A for LB ADL management. MD in at end of session and notified of son's request for stronger pain meds as tylenol  did not appear to be managing pt's pain in L hip well enough. Pt left seated in recliner with all needs in place and will cont to require skilled acute OT services to maximize his safety and IND to return to PLOF.       If plan is discharge home, recommend the following:  A lot of help with  bathing/dressing/bathroom;Two people to help with walking and/or transfers;Help with stairs or ramp for entrance;Direct supervision/assist for medications management;Direct supervision/assist for financial management;Supervision due to cognitive status;Assistance with cooking/housework;Assist for transportation   Equipment Recommendations  Other (comment);BSC/3in1 (defer to next vnue)    Recommendations for Other Services      Precautions / Restrictions Precautions Precautions: Fall Recall of Precautions/Restrictions: Impaired Restrictions Weight Bearing Restrictions Per Provider Order: No       Mobility Bed Mobility Overal bed mobility: Needs Assistance Bed Mobility: Supine to Sit     Supine to sit: Mod assist, HOB elevated, Used rails, Max assist     General bed mobility comments: Mod/Max A x1 for BLE management and trunkal elevation to safely reach EOB    Transfers Overall transfer level: Needs assistance Equipment used: Rolling walker (2 wheels) Transfers: Sit to/from Stand Sit to Stand: +2 physical assistance, Mod assist, From elevated surface     Step pivot transfers: Mod assist, +2 physical assistance, +2 safety/equipment     General transfer comment: Mod A x2 for STS from elevated bed and for step pivot to recliner via small shuffling steps with RLE buckling at times d/t inability to weight bear through LLE d/t pain     Balance Overall balance assessment: Needs assistance Sitting-balance support: No upper extremity supported, Feet supported Sitting balance-Leahy Scale: Fair Sitting balance - Comments: poor core strength noted as increased work to lean forward in recliner for bathing task   Standing balance support: During functional activity, Reliant on assistive device for balance, Bilateral upper extremity supported Standing balance-Leahy Scale: Poor Standing balance comment: heavy UE reliance on RW and +2 to stand and pivot  ADL either performed or assessed with clinical judgement   ADL Overall ADL's : Needs assistance/impaired     Grooming: Wash/dry face;Set up;Sitting Grooming Details (indicate cue type and reason): in recliner Upper Body Bathing: Set up;Sitting;Cueing for sequencing Upper Body Bathing Details (indicate cue type and reason): cueing for intiation, but able to perform with set up assist Lower Body Bathing: Moderate assistance;Sit to/from stand;Sitting/lateral leans Lower Body Bathing Details (indicate cue type and reason): for backs of legs and buttocks in standing at recliner Upper Body Dressing : Moderate assistance;Sitting Upper Body Dressing Details (indicate cue type and reason): Donning gown in sitting     Toilet Transfer: Rolling walker (2 wheels);Stand-pivot;Moderate assistance;+2 for safety/equipment;+2 for physical assistance Toilet Transfer Details (indicate cue type and reason): simulated to recliner with increased difficulty weight bearing through LLE to advance RLE, therefore small shuffle steps with buckling noted                Extremity/Trunk Assessment              Vision       Perception     Praxis     Communication Communication Communication: Impaired Factors Affecting Communication: Difficulty expressing self;Hearing impaired   Cognition Arousal: Alert Behavior During Therapy: [facility] for tasks assessed/performed                                 Following commands: Impaired Following commands impaired: Follows multi-step commands with increased time      Cueing   Cueing Techniques: Verbal cues, Gestural cues, Tactile cues, Visual cues  Exercises      Shoulder Instructions       General Comments son present for session    Pertinent Vitals/ Pain       Pain Assessment Pain Assessment: 0-10 Pain Score: 8  Pain Location: L hip and L shoulder-chronic RTC issues and pain improves at rest-MD checking into adding tramadol  Pain  Descriptors / Indicators: Constant, Pressure Pain Intervention(s): Monitored during session, Repositioned, Limited activity within patient's tolerance  Home Living                                          Prior Functioning/Environment              Frequency  Min 2X/week        Progress Toward Goals  OT Goals(current goals can now be found in the care plan section)  Progress towards OT goals: Progressing toward goals  Acute Rehab OT Goals Patient Stated Goal: get stronger OT Goal Formulation: With patient/family Time For Goal Achievement: 03/10/24 Potential to Achieve Goals: Good  Plan      Co-evaluation                 AM-PAC OT 6 Clicks Daily Activity     Outcome Measure   Help from another person eating meals?: None Help from another person taking care of personal grooming?: A Little Help from another person toileting, which includes using toliet, bedpan, or urinal?: A Lot Help from another person bathing (including washing, rinsing, drying)?: A Lot Help from another person to put on and taking off regular upper body clothing?: A Lot Help from another person to put on and taking off regular lower body clothing?: A Lot 6 Click Score: 15    End  of Session Equipment Utilized During Treatment: Gait belt;Rolling walker (2 wheels)  OT Visit Diagnosis: Unsteadiness on feet (R26.81);Other abnormalities of gait and mobility (R26.89);Repeated falls (R29.6);Muscle weakness (generalized) (M62.81)   Activity Tolerance Patient tolerated treatment well   Patient Left in chair;with call bell/phone within reach;with chair alarm set;with family/visitor present   Nurse Communication Mobility status        Time: 8886-8843 OT Time Calculation (min): 43 min  Charges: OT General Charges $OT Visit: 1 Visit OT Treatments $Self Care/Home Management : 23-37 mins $Therapeutic Activity: 8-22 mins  Michaila Kenney, OTR/L  02/27/24, 2:58 PM   Zacarias Krauter  E Kristyne Woodring 02/27/2024, 2:55 PM

## 2024-02-28 DIAGNOSIS — M6282 Rhabdomyolysis: Secondary | ICD-10-CM | POA: Diagnosis not present

## 2024-02-28 DIAGNOSIS — J181 Lobar pneumonia, unspecified organism: Secondary | ICD-10-CM | POA: Diagnosis not present

## 2024-02-28 DIAGNOSIS — W19XXXA Unspecified fall, initial encounter: Secondary | ICD-10-CM | POA: Diagnosis not present

## 2024-02-28 DIAGNOSIS — I5022 Chronic systolic (congestive) heart failure: Secondary | ICD-10-CM | POA: Diagnosis not present

## 2024-02-28 MED ORDER — POLYETHYLENE GLYCOL 3350 17 G PO PACK
17.0000 g | PACK | Freq: Every day | ORAL | Status: DC
  Filled 2024-02-28 (×2): qty 1

## 2024-02-28 NOTE — TOC Progression Note (Addendum)
 Transition of Care [facility]) - Progression Note    Patient Details  Name: Raymond Rios MRN: [id number] Date of Birth: November 28, 1930  Transition of Care [facility]) CM/SW Contact  [name], RN 02/28/2024, 10:25 AM  Clinical Narrative:     Call received from patient's son, [name], phone: [phone] regarding patient status and SNF preferences. Patient's son, [name] informed of accepting SNF, [facility]. Per patient's son, verbalized agreement for patient to attend [facility]. CM call to Tammy, Admissions, [facility], phone: [phone] regarding bed availability and if CM can initiate auth. Per Tammy, SNF is initiating bed offer and CM can initiate auth.   CM alert to [employer], CMA and Rojelio RAMAN, CMA regarding initiating auth for [name].    Alert received from Rojelio RAMAN, [place] regarding auth status, SNF pending shara pi#[id number].   Expected Discharge Plan: Skilled Nursing Facility Barriers to Discharge: Continued Medical Work up Expected Discharge Plan and Services    SNF   Living arrangements for the past 2 months: Apartment    Social Drivers of Health (SDOH) Interventions SDOH Screenings   Food Insecurity: No Food Insecurity (02/24/2024)  Housing: Low Risk  (02/24/2024)  Transportation Needs: No Transportation Needs (02/24/2024)  Utilities: Not At Risk (02/24/2024)  Social Connections: Unknown (02/24/2024)  Tobacco Use: Medium Risk (01/16/2024)    Readmission Risk Interventions     No data to display

## 2024-02-28 NOTE — Plan of Care (Signed)

## 2024-02-28 NOTE — Progress Notes (Signed)
 Progress Note   Patient: Raymond Rios FMW:[id number] DOB: 1930-08-25 DOA: 02/24/2024     0 DOS: the patient was seen and examined on 02/28/2024   Brief hospital course: 89 y.o. male with medical history significant of seasonal allergies, anxiety, osteoarthritis, unspecified anemia, urinary hesitancy, BPH, Peyronie's disease, GERD, hypertension, hyperlipidemia, CAD, ischemic cardiomyopathy, chronic combined systolic and diastolic heart failure, palpitations, paroxysmal atrial fibrillation, myasthenia gravis, benign familial tremor, rectal bleeding, history of community-acquired pneumonia, lower extremity cramps, varicella-zoster, hypothyroidism, vitamin D deficiency who was found on the floor his assisted living facility after having a fall apparently at some point yesterday according to his son with complaints of left shoulder and left hip pain. He denied having any head trauma but had dried blood on his left facial area.  According to his son, his memory is impaired compared to his baseline.  He knew it was Sunday, but was otherwise disoriented to time and date.  He knows he is in the hospital in [place].  He denied headache, chest, back or abdominal pain at the moment.   7/21.  Patient stated he had a fall and could not get up for a while.  He lives by self in independent living at [facility].  Found to have rhabdomyolysis and possible pneumonia.  Continue gentle fluids, hold statin.  Start antibiotic.  Check a procalcitonin tomorrow morning.  7/23: Remained hemodynamically stable, procalcitonin negative.  Improving CK.  Awaiting SNF placement. Tramadol  was added as Tylenol  was not very helpful with his pain with ambulation secondary to fall.  7/24: Hemodynamically stable, had a bed offer-awaiting insurance authorization.  Assessment and Plan: * Rhabdomyolysis CK 2439 on presentation down to 699.  -Keep holding statin  Lobar pneumonia (HCC) With procalcitonin negative  Continue Augmentin  to  complete the course  Chronic HFrEF (heart failure with reduced ejection fraction) (HCC)  Last EF on record 20 to 25%.   Continue low-dose Coreg . Continue home every other day Lasix   Essential hypertension Continue low-dose Coreg .  Hyperlipidemia LDL goal <70 Hold statin with rhabdomyolysis  Hypothyroidism On levothyroxine   Paroxysmal atrial fibrillation (HCC) Single episode in the past.  Not on anticoagulation on Coreg .  Currently sinus rhythm.  GERD Continue PPI  Hypokalemia Resolved with repletion -Continue to monitor  Generalized weakness Physical therapy recommending rehab  Myocardial injury Likely from rhabdomyolysis  Abnormal LFTs History of hepatic steatosis.  Liver function test trending better, alkaline phosphatase 281, total bilirubin 0.9, AST 107 ALT 61  Anxiety and depression On Paxil   BPH (benign prostatic hyperplasia) On Flomax   Myasthenia gravis (HCC) Continue to monitor   Subjective: Patient was resting comfortably in bed when seen today.  Denies any significant pain.  No bowel movement for the past 2 days.  Son at bedside.  Physical Exam: Vitals:   02/27/24 1441 02/27/24 2037 02/28/24 0500 02/28/24 0815  BP: 131/75 120/66 120/72 (!) 158/72  Pulse: 70 74 69 69  Resp: 17 16 16  (!) 22  Temp: 97.7 F (36.5 C) 98.2 F (36.8 C) 98.1 F (36.7 C) 98.3 F (36.8 C)  TempSrc: Oral Oral Oral Oral  SpO2: 96% 95% 94% 95%  Weight:      Height:       General.  Frail elderly man, in no acute distress. Pulmonary.  Lungs clear bilaterally, normal respiratory effort. CV.  Regular rate and rhythm, no JVD, rub or murmur. Abdomen.  Soft, nontender, nondistended, BS positive. CNS.  Alert and oriented .  No focal neurologic deficit. Extremities.  No edema, no cyanosis, pulses intact and symmetrical. Psychiatry.  Judgment and insight appears normal.    Data Reviewed: Prior data reviewed  Family Communication: Discussed with son at  bedside  Disposition: Status is: Observation Physical therapy recommended rehab.  Patient at independent living.  TOC to look into rehabs  Planned Discharge Destination: Rehab  DVT prophylaxis.  Lovenox  Time spent: 44 minutes  Author: Amaryllis Dare, MD 02/28/2024 1:59 PM  For on call review [email or web address].

## 2024-02-28 NOTE — Plan of Care (Signed)
  Problem: Clinical Measurements: Goal: Will remain free from infection Outcome: Progressing Goal: Respiratory complications will improve Outcome: Progressing   Problem: Nutrition: Goal: Adequate nutrition will be maintained Outcome: Progressing   Problem: Coping: Goal: Level of anxiety will decrease Outcome: Progressing   Problem: Elimination: Goal: Will not experience complications related to bowel motility Outcome: Progressing Goal: Will not experience complications related to urinary retention Outcome: Progressing   Problem: Pain Managment: Goal: General experience of comfort will improve and/or be controlled Outcome: Progressing   Problem: Safety: Goal: Ability to remain free from injury will improve Outcome: Progressing   Problem: Skin Integrity: Goal: Risk for impaired skin integrity will decrease Outcome: Progressing   Problem: Education: Goal: Knowledge of General Education information will improve Description: Including pain rating scale, medication(s)/side effects and non-pharmacologic comfort measures Outcome: Not Progressing   Problem: Health Behavior/Discharge Planning: Goal: Ability to manage health-related needs will improve Outcome: Not Progressing   Problem: Clinical Measurements: Goal: Ability to maintain clinical measurements within normal limits will improve Outcome: Not Progressing   Problem: Activity: Goal: Risk for activity intolerance will decrease Outcome: Not Progressing

## 2024-02-29 DIAGNOSIS — I5022 Chronic systolic (congestive) heart failure: Secondary | ICD-10-CM | POA: Diagnosis not present

## 2024-02-29 DIAGNOSIS — W19XXXA Unspecified fall, initial encounter: Secondary | ICD-10-CM | POA: Diagnosis not present

## 2024-02-29 DIAGNOSIS — J181 Lobar pneumonia, unspecified organism: Secondary | ICD-10-CM | POA: Diagnosis not present

## 2024-02-29 DIAGNOSIS — M6282 Rhabdomyolysis: Secondary | ICD-10-CM | POA: Diagnosis not present

## 2024-02-29 LAB — BASIC METABOLIC PANEL WITH GFR
Anion gap: 6 (ref 5–15)
BUN: 20 mg/dL (ref 8–23)
CO2: 29 mmol/L (ref 22–32)
Calcium: 8.2 mg/dL — ABNORMAL LOW (ref 8.9–10.3)
Chloride: 103 mmol/L (ref 98–111)
Creatinine, Ser: 0.75 mg/dL (ref 0.61–1.24)
GFR, Estimated: >60 mL/min (ref >60)
Glucose, Bld: 104 mg/dL — ABNORMAL HIGH (ref 70–99)
Potassium: 4.5 mmol/L (ref 3.5–5.1)
Sodium: 138 mmol/L (ref 135–145)

## 2024-02-29 LAB — CK: Total CK: 156 U/L (ref 49–397)

## 2024-02-29 MED ORDER — AMOXICILLIN-POT CLAVULANATE 875-125 MG PO TABS: 1.0000 | ORAL_TABLET | Freq: Two times a day (BID) | ORAL | Status: AC

## 2024-02-29 MED ORDER — POLYETHYLENE GLYCOL 3350 17 G PO PACK: 17.0000 g | PACK | Freq: Every day | ORAL | Status: AC

## 2024-02-29 MED ORDER — TRAMADOL HCL 50 MG PO TABS: 50.0000 mg | ORAL_TABLET | Freq: Four times a day (QID) | ORAL | 0 refills | Status: AC | PRN

## 2024-02-29 NOTE — TOC Transition Note (Signed)
 Transition of Care [facility]) - Discharge Note   Patient Details  Name: Raymond Rios MRN: [id number] Date of Birth: [birth year]  [name] CM/SW Contact:  [name], RN 02/29/2024, 11:16 AM   Clinical Narrative:     Discharge orders noted, discharge summary noted, faxed to [name]. Patient to discharge and admit to [name] today via [facility]. CM call to patient's son, [name] regarding shara approval, discharge and admission to [name] today. Per patient's son, [name], verbalized understanding and agreement with plan.   Final next level of care: Skilled Nursing Facility Barriers to Discharge: No Barriers Identified   Patient Goals and CMS Choice    SNF   Discharge Placement     SNF           Discharge Plan and Services Additional resources added to the After Visit Summary for    Social Drivers of Health (SDOH) Interventions SDOH Screenings   Food Insecurity: No Food Insecurity (02/24/2024)  Housing: Low Risk  (02/24/2024)  Transportation Needs: No Transportation Needs (02/24/2024)  Utilities: Not At Risk (02/24/2024)  Social Connections: Unknown (02/24/2024)  Tobacco Use: Medium Risk (01/16/2024)     Readmission Risk Interventions     No data to display

## 2024-02-29 NOTE — Discharge Summary (Signed)
 Physician Discharge Summary   Patient: Raymond Rios MRN: [id number] DOB: Nov 02, 1930  Admit date:     02/24/2024  Discharge date: 02/29/24  Discharge Physician: Amaryllis Dare   PCP: [name]   Recommendations at discharge:  Please obtain CBC and BMP and follow-up Follow-up with primary care provider  Discharge Diagnoses: Principal Problem:   Rhabdomyolysis Active Problems:   Lobar pneumonia (HCC)   Chronic HFrEF (heart failure with reduced ejection fraction) (HCC)   Essential hypertension   Hyperlipidemia LDL goal <70   Paroxysmal atrial fibrillation (HCC)   Hypothyroidism   GERD   Ischemic cardiomyopathy   Myasthenia gravis (HCC)   Coronary artery disease   BPH (benign prostatic hyperplasia)   Anxiety and depression   Hepatic steatosis   Abnormal LFTs   Myocardial injury   Generalized weakness   Hypokalemia   [facility] Course: 89 y.o. male with medical history significant of seasonal allergies, anxiety, osteoarthritis, unspecified anemia, urinary hesitancy, BPH, Peyronie's disease, GERD, hypertension, hyperlipidemia, CAD, ischemic cardiomyopathy, chronic combined systolic and diastolic heart failure, palpitations, paroxysmal atrial fibrillation, myasthenia gravis, benign familial tremor, rectal bleeding, history of community-acquired pneumonia, lower extremity cramps, varicella-zoster, hypothyroidism, vitamin D deficiency who was found on the floor his assisted living facility after having a fall apparently at some point yesterday according to his son with complaints of left shoulder and left hip pain. He denied having any head trauma but had dried blood on his left facial area.  According to his son, his memory is impaired compared to his baseline.  He knew it was Sunday, but was otherwise disoriented to time and date.  He knows he is in the hospital in [place].  He denied headache, chest, back or abdominal pain at the moment.   7/21.  Patient stated he  had a fall and could not get up for a while.  He lives by self in independent living at [name].  Found to have rhabdomyolysis and possible pneumonia.  Continue gentle fluids, hold statin.  Start antibiotic.  Check a procalcitonin tomorrow morning.  7/23: Remained hemodynamically stable, procalcitonin negative.  Improving CK.  Awaiting SNF placement. Tramadol  was added as Tylenol  was not very helpful with his pain with ambulation secondary to fall.  7/24: Hemodynamically stable, had a bed offer-awaiting insurance authorization.  7/25: Remained hemodynamically stable, rhabdomyolysis resolved with CK at 156 today.  Rest of the labs stable.  Patient is being discharged to SNF for further rehab.  He has been given 2 more doses of Augmentin  to complete the course for pneumonia.  Patient was given some tramadol  to use if simple Tylenol  does not work for his aches and pains.  Please avoid constipation.  He will continue on current medications and need to have a close follow-up with his providers for further assistance.  Assessment and Plan: * Rhabdomyolysis CK 2439 on presentation, has been resolved with CK of 156 today. - We held statin initially which can be resumed on discharge.  Lobar pneumonia (HCC) With procalcitonin negative  Continue Augmentin  to complete the course  Chronic HFrEF (heart failure with reduced ejection fraction) (HCC)  Last EF on record 20 to 25%.   Continue low-dose Coreg . Continue home every other day Lasix   Essential hypertension Continue low-dose Coreg .  Hyperlipidemia LDL goal <70 Hold statin with rhabdomyolysis initially and can be resumed on discharge  Hypothyroidism On levothyroxine   Paroxysmal atrial fibrillation (HCC) Single episode in the past.  Not on anticoagulation on Coreg .  Currently sinus rhythm.  GERD Continue PPI  Hypokalemia Resolved with repletion -Continue to monitor  Generalized weakness Physical therapy recommending  rehab  Myocardial injury Likely from rhabdomyolysis  Abnormal LFTs History of hepatic steatosis.  Liver function test trending better, alkaline phosphatase 281, total bilirubin 0.9, AST 107 ALT 61  Anxiety and depression On Paxil   BPH (benign prostatic hyperplasia) On Flomax   Myasthenia gravis (HCC) Continue to monitor      Pain control - [place]  Controlled Substance Reporting System database was reviewed. and patient was instructed, not to drive, operate heavy machinery, perform activities at heights, swimming or participation in water activities or provide baby-sitting services while on Pain, Sleep and Anxiety Medications; until their outpatient Physician has advised to do so again. Also recommended to not to take more than prescribed Pain, Sleep and Anxiety Medications.  Consultants: None Procedures performed: None Disposition: Skilled nursing facility Diet recommendation:  Discharge Diet Orders (From admission, onward)     Start     Ordered   02/29/24 0000  Diet - low sodium heart healthy        02/29/24 1011           Cardiac diet DISCHARGE MEDICATION: Allergies as of 02/29/2024       Reactions   Other Hives   Sulfonamide Derivatives    REACTION: unspecified        Medication List     TAKE these medications    acetaminophen  325 MG tablet Commonly known as: TYLENOL  Take 2 tablets (650 mg total) by mouth every 6 (six) hours as needed for mild pain, fever or headache.   albuterol  108 (90 Base) MCG/ACT inhaler Commonly known as: VENTOLIN  HFA Inhale 2 puffs into the lungs every 6 (six) hours as needed for wheezing or shortness of breath.   amoxicillin -clavulanate 875-125 MG tablet Commonly known as: AUGMENTIN  Take 1 tablet by mouth every 12 (twelve) hours for 2 doses.   aspirin  EC 81 MG tablet Take 1 tablet (81 mg total) by mouth daily. Start once you have finished your plavix .   atorvastatin  40 MG tablet Commonly known as: LIPITOR TAKE 1  TABLET BY MOUTH DAILY AT 6 PM.   carvedilol  3.125 MG tablet Commonly known as: COREG  Take 1 tablet (3.125 mg total) by mouth 2 (two) times daily with a meal.   clobetasol ointment 0.05 % Commonly known as: TEMOVATE Apply 1 Application topically 2 (two) times daily as needed.   feeding supplement Liqd Take 237 mLs by mouth daily.   furosemide  20 MG tablet Commonly known as: LASIX  TAKE 1 TABLET BY MOUTH EVERY OTHER DAY   levothyroxine  75 MCG tablet Commonly known as: SYNTHROID  Take 75 mcg by mouth every morning.   lisinopril  2.5 MG tablet Commonly known as: ZESTRIL  TAKE 1 TABLET EVERY DAY   omeprazole 20 MG capsule Commonly known as: PRILOSEC Take 20 mg by mouth daily.   PARoxetine  20 MG tablet Commonly known as: PAXIL  Take 20 mg by mouth daily.   polyethylene glycol 17 g packet Commonly known as: MIRALAX  / GLYCOLAX  Take 17 g by mouth daily. Start taking on: March 01, 2024   tamsulosin  0.4 MG Caps capsule Commonly known as: FLOMAX  Take 0.4 mg by mouth daily.   traMADol  50 MG tablet Commonly known as: ULTRAM  Take 1 tablet (50 mg total) by mouth every 6 (six) hours as needed for moderate pain (pain score 4-6) or severe pain (pain score 7-10).        Contact information for  follow-up providers     [name]. Schedule an appointment as soon as possible for a visit in 1 week(s).   Contact information: [address] [place] [address] [phone]              Contact information for after-discharge care     Destination     [facility] [place], [place]. [place]   Service: Skilled Nursing Contact information: [address] [place]   [address] [phone]                    Discharge Exam: Fredricka Weights   02/24/24 0835  Weight: 81 kg   General.  Frail elderly man, in no acute distress. Pulmonary.  Lungs clear bilaterally, normal respiratory effort. CV.  Regular rate and rhythm, no JVD, rub or murmur. Abdomen.   Soft, nontender, nondistended, BS positive. CNS.  Alert and oriented .  No focal neurologic deficit. Extremities.  No edema,  pulses intact and symmetrical. Psychiatry.  Judgment and insight appears normal.   Condition at discharge: stable  The results of significant diagnostics from this hospitalization (including imaging, microbiology, ancillary and laboratory) are listed below for reference.   Imaging Studies: MR BRAIN WO CONTRAST Result Date: 02/24/2024 CLINICAL DATA:  Provided history: Mental status change, unknown cause. EXAM: MRI HEAD WITHOUT CONTRAST TECHNIQUE: Multiplanar, multiecho pulse sequences of the brain and surrounding structures were obtained without intravenous contrast. COMPARISON:  Head CT 02/24/2024.  Brain MRI 04/25/2013. FINDINGS: Brain: Generalized parenchymal atrophy. Chronic MCA territory cortical/subcortical infarct within the posterior left frontal lobe (precentral gyrus). Small chronic cortical infarct within the right occipital lobe (PCA vascular territory). Advanced multifocal T2 FLAIR hyperintense signal abnormality elsewhere within the cerebral white matter, and within/about the bilateral deep gray nuclei, nonspecific but compatible with chronic small vessel ischemic disease. Superimposed prominent perivascular spaces within the bilateral deep gray nuclei. Chronic infarcts within the bilateral cerebellar hemispheres (right greater than left). There is no acute infarct. No evidence of an intracranial mass. No extra-axial fluid collection. No midline shift. Vascular: Signal abnormality within portions of the intracranial right vertebral artery suggesting high-grade stenosis or vessel occlusion. Right parietooccipital developmental venous anomaly (anatomic variant). Skull and upper cervical spine: No focal worrisome marrow lesion. Mild grade 1 anterolisthesis at C3-C4 and C4-C5. Incompletely assessed cervical spondylosis. Sinuses/Orbits: No mass or acute finding within the  imaged orbits. Prior right ocular lens replacement. Mild mucosal thickening within the bilateral ethmoid, sphenoid and maxillary sinuses. IMPRESSION: 1.  No evidence of an acute intracranial abnormality. 2. Parenchymal atrophy, advanced chronic small vessel ischemic disease and chronic infarcts, as described. A chronic MCA territory infarct within the posterior left frontal lobe (precentral gyrus) is new from the MRI of 04/25/2013. Chronic small vessel ischemic disease has also progressed. 3. Signal abnormality within portions of the intracranial right vertebral artery suggesting high-grade stenosis or vessel occlusion. CT or MR angiography may be obtained for further evaluation, as clinically warranted. 4. Mild paranasal sinus mucosal thickening. Electronically Signed   By: Rockey Childs D.O.   On: 02/24/2024 19:40   DG Chest Port 1 View Result Date: 02/24/2024 CLINICAL DATA:  CHF. EXAM: PORTABLE CHEST 1 VIEW COMPARISON:  X-ray 11/10/2022.  CTA 11/10/2022. FINDINGS: Underinflation. Enlarged cardiopericardial silhouette with calcified aorta. Patchy left lung base opacity. Possible infiltrate. Slight prominence of central vasculature. Apical pleural thickening. No pneumothorax or effusion. Overlapping cardiac leads. Film is rotated to left. Scattered degenerative changes. Osteopenia. IMPRESSION: Enlarged heart with some vascular congestion. Mild  left lung base patchy opacity. Infiltrate is possible. Recommend follow-up. Underinflation. Electronically Signed   By: Ranell Bring M.D.   On: 02/24/2024 13:11   US  ABDOMEN LIMITED RUQ (LIVER/GB) Result Date: 02/24/2024 CLINICAL DATA:  Elevated LFTs. EXAM: ULTRASOUND ABDOMEN LIMITED RIGHT UPPER QUADRANT COMPARISON:  None Available. FINDINGS: Gallbladder: Status post cholecystectomy.  Negative sonographic Murphy's sign. Common bile duct: Diameter: 4.5 mm.  No intrahepatic bile duct dilatation. Liver: No focal lesion identified. Heterogeneous sub optimal visualization of  the left lobe. Increased parenchymal echogenicity suggesting fatty infiltration. Portal vein is patent on color Doppler imaging with normal direction of blood flow towards the liver. Other: None. IMPRESSION: 1. Status post cholecystectomy. 2. Hepatic steatosis. Electronically Signed   By: Waddell Calk M.D.   On: 02/24/2024 12:19   DG Shoulder Left Result Date: 02/24/2024 CLINICAL DATA:  89 year old male status post fall with hip pain. Dizziness. EXAM: LEFT SHOULDER - 2+ VIEW COMPARISON:  Chest CTA, radiographs 12/09/2022. FINDINGS: Three views. Chronic superior subluxation of the humeral head. No glenohumeral joint dislocation. Proximal left humerus, left clavicle and scapula appear intact. Chronic appearing ossific fragment at the tip of the acromion, visible on previous coronal CTA. Visible left ribs appear intact. Left lung ventilation stable. IMPRESSION: 1. No acute fracture or dislocation identified about the left shoulder. 2. Chronic left rotator cuff pathology, small chronic ossific fragment at the acromion. Electronically Signed   By: VEAR Hurst M.D.   On: 02/24/2024 09:27   CT Cervical Spine Wo Contrast Result Date: 02/24/2024 CLINICAL DATA:  89 year old male status post fall with hip pain. Dizziness. EXAM: CT CERVICAL SPINE WITHOUT CONTRAST TECHNIQUE: Multidetector CT imaging of the cervical spine was performed without intravenous contrast. Multiplanar CT image reconstructions were also generated. RADIATION DOSE REDUCTION: This exam was performed according to the departmental dose-optimization program which includes automated exposure control, adjustment of the mA and/or kV according to patient size and/or use of iterative reconstruction technique. COMPARISON:  Head CT today.  Cervical spine CT 06/17/2022. FINDINGS: Alignment: Stable cervical lordosis. Cervicothoracic junction alignment is within normal limits. Bilateral posterior element alignment is within normal limits. Skull base and vertebrae:  Bone mineralization is within normal limits for age. Visualized skull base is intact. No atlanto-occipital dissociation. C1 and C2 appear intact and aligned. No acute osseous abnormality identified. Soft tissues and spinal canal: No prevertebral fluid or swelling. No visible canal hematoma. Calcified cervical carotid atherosclerosis is mild to moderate for age. Disc levels: Mostly age-appropriate chronic cervical spine degeneration, stable from last year. Upper chest: Stable visible upper thoracic levels. Calcified aortic atherosclerosis. Mild apical lung scarring and/or atelectasis. Mild cine key I type retained secretions in the trachea. IMPRESSION: 1. No acute traumatic injury identified in the cervical spine. 2. Stable cervical spine degeneration from last year. 3. Mild retained secretions in the trachea. 4. Aortic Atherosclerosis (ICD10-I70.0). Electronically Signed   By: VEAR Hurst M.D.   On: 02/24/2024 09:24   CT Head Wo Contrast Result Date: 02/24/2024 CLINICAL DATA:  89 year old male status post fall with hip pain. Dizziness. EXAM: CT HEAD WITHOUT CONTRAST TECHNIQUE: Contiguous axial images were obtained from the base of the skull through the vertex without intravenous contrast. RADIATION DOSE REDUCTION: This exam was performed according to the departmental dose-optimization program which includes automated exposure control, adjustment of the mA and/or kV according to patient size and/or use of iterative reconstruction technique. COMPARISON:  Brain MRI 04/25/2013.  Head CT 06/17/2022. FINDINGS: Brain: Chronic cerebellar infarcts, moderate on the right. Stable  cerebral volume. Similar advanced bilateral cerebral white matter and deep gray nuclei heterogeneity appears stable from last year. Small chronic cortical infarct, left superior perirolandic is stable. No midline shift, ventriculomegaly, mass effect, evidence of mass lesion, intracranial hemorrhage or evidence of cortically based acute infarction.  Vascular: Calcified atherosclerosis at the skull base. No suspicious intracranial vascular hyperdensity. Skull: Stable and intact.  No acute osseous abnormality identified. Sinuses/Orbits: Chronic but increased paranasal sinus mucosal thickening bilaterally, mild-to-moderate. Faint bubbly opacity. Tympanic cavities and mastoids remain clear. Other: No acute orbit or scalp soft tissue injury identified. IMPRESSION: 1. No acute intracranial abnormality or acute traumatic injury identified. 2. Stable non contrast CT appearance of advanced chronic small vessel disease. 3. Mild increased paranasal sinus inflammation since last year. Electronically Signed   By: VEAR Hurst M.D.   On: 02/24/2024 09:22   DG HIP UNILAT WITH PELVIS 2-3 VIEWS LEFT Result Date: 02/24/2024 CLINICAL DATA:  89 year old male status post fall with hip pain. EXAM: DG HIP (WITH OR WITHOUT PELVIS) 2-3V LEFT COMPARISON:  None Available. FINDINGS: Three view 0853 hours. Bone mineralization is within normal limits for age. Femoral heads normally located. Pelvis appears intact. Grossly intact proximal right femur. Proximal left femur appears intact on 2 dedicated views. Calcified aortic atherosclerosis. Iliofemoral atherosclerosis. Negative visible bowel gas pattern. IMPRESSION: 1. No acute fracture or dislocation identified about the left hip or pelvis. 2. Calcified atherosclerosis. Electronically Signed   By: VEAR Hurst M.D.   On: 02/24/2024 09:19    Microbiology: Results for orders placed or performed during the hospital encounter of 06/17/22  Urine Culture     Status: Abnormal   Collection Time: 06/17/22 10:00 AM   Specimen: Urine, Clean Catch  Result Value Ref Range Status   Specimen Description URINE, CLEAN CATCH  Final   Special Requests   Final    NONE Performed at [facility], [address], [place], [place] [address]    Culture MULTIPLE SPECIES PRESENT, SUGGEST RECOLLECTION (A)  Final   Report Status 06/18/2022 FINAL  Final     Labs: CBC: Recent Labs  Lab 02/24/24 0840 02/25/24 0447 02/26/24 0629  WBC 10.5 9.2 8.0  NEUTROABS 8.0*  --   --   HGB 13.4 10.3* 10.8*  HCT 42.0 31.6* 33.3*  MCV 87.9 88.3 87.9  PLT 186 148* 152   Basic Metabolic Panel: Recent Labs  Lab 02/24/24 0840 02/24/24 1041 02/25/24 0447 02/26/24 0629 02/29/24 0557  NA 136  --  136 138 138  K 3.7  --  3.4* 3.9 4.5  CL 99  --  103 105 103  CO2 25  --  24 27 29   GLUCOSE 127*  --  112* 100* 104*  BUN 29*  --  40* 33* 20  CREATININE 1.04  --  1.16 0.87 0.75  CALCIUM  9.1  --  7.7* 8.0* 8.2*  MG  --  2.4  --   --   --   PHOS  --  3.0  --   --   --    Liver Function Tests: Recent Labs  Lab 02/24/24 0840 02/25/24 0447  AST 162* 107*  ALT 87* 61*  ALKPHOS 405* 281*  BILITOT 1.2 0.9  PROT 9.0* 6.3*  ALBUMIN 3.0* 2.2*   CBG: No results for input(s): GLUCAP in the last 168 hours.  Discharge time spent: greater than 30 minutes.  This record has been created using Conservation officer, historic buildings. Errors have been sought and corrected,but may  not always be located. Such creation errors do not reflect on the standard of care.   Signed: Amaryllis Dare, MD [facility] 02/29/2024

## 2024-02-29 NOTE — Plan of Care (Signed)

## 2024-02-29 NOTE — Progress Notes (Signed)
 Pt discharge with medical transport to [facility]. At time of discharge, pt VSS with no complaints of pain. AVS discharge instructions provided by this RN to [facility], RN at [facility]. All questions and concerns answered at this time. All pt belongings sent with pt. Pt and pt's son updated, at bedside.  Pt discharged with AVS instructions and signed DNR form.

## 2024-02-29 NOTE — Plan of Care (Signed)
  Problem: Education: Goal: Knowledge of General Education information will improve Description: Including pain rating scale, medication(s)/side effects and non-pharmacologic comfort measures Outcome: Adequate for Discharge   Problem: Health Behavior/Discharge Planning: Goal: Ability to manage health-related needs will improve Outcome: Adequate for Discharge   Problem: Clinical Measurements: Goal: Ability to maintain clinical measurements within normal limits will improve Outcome: Adequate for Discharge Goal: Will remain free from infection Outcome: Adequate for Discharge Goal: Diagnostic test results will improve Outcome: Adequate for Discharge Goal: Respiratory complications will improve Outcome: Adequate for Discharge Goal: Cardiovascular complication will be avoided Outcome: Adequate for Discharge   Problem: Activity: Goal: Risk for activity intolerance will decrease Outcome: Adequate for Discharge   Problem: Nutrition: Goal: Adequate nutrition will be maintained Outcome: Adequate for Discharge   Problem: Coping: Goal: Level of anxiety will decrease Outcome: Adequate for Discharge   Problem: Elimination: Goal: Will not experience complications related to bowel motility Outcome: Adequate for Discharge Goal: Will not experience complications related to urinary retention Outcome: Adequate for Discharge   Problem: Pain Managment: Goal: General experience of comfort will improve and/or be controlled Outcome: Adequate for Discharge   Problem: Safety: Goal: Ability to remain free from injury will improve Outcome: Adequate for Discharge   Problem: Skin Integrity: Goal: Risk for impaired skin integrity will decrease Outcome: Adequate for Discharge   Problem: Acute Rehab OT Goals (only OT should resolve) Goal: Pt. Will Perform Grooming Outcome: Adequate for Discharge Goal: Pt. Will Perform Lower Body Dressing Outcome: Adequate for Discharge Goal: Pt. Will Transfer To  Toilet Outcome: Adequate for Discharge Goal: Pt. Will Perform Toileting-Clothing Manipulation Outcome: Adequate for Discharge   Problem: Acute Rehab PT Goals(only PT should resolve) Goal: Patient Will Transfer Sit To/From Stand Outcome: Adequate for Discharge Goal: Pt Will Transfer Bed To Chair/Chair To Bed Outcome: Adequate for Discharge Goal: Pt Will Ambulate Outcome: Adequate for Discharge

## 2024-03-28 ENCOUNTER — Ambulatory Visit: Admitting: Pediatrics

## 2024-04-10 ENCOUNTER — Encounter: Payer: Self-pay | Admitting: Podiatry

## 2024-04-11 ENCOUNTER — Encounter: Payer: Self-pay | Admitting: Podiatry

## 2024-04-11 ENCOUNTER — Ambulatory Visit (INDEPENDENT_AMBULATORY_CARE_PROVIDER_SITE_OTHER): Admitting: Podiatry

## 2024-04-11 ENCOUNTER — Ambulatory Visit

## 2024-04-11 VITALS — Ht 68.0 in | Wt 178.6 lb

## 2024-04-11 DIAGNOSIS — M7751 Other enthesopathy of right foot: Secondary | ICD-10-CM

## 2024-04-11 DIAGNOSIS — L97511 Non-pressure chronic ulcer of other part of right foot limited to breakdown of skin: Secondary | ICD-10-CM

## 2024-04-11 NOTE — Progress Notes (Signed)
 Chief Complaint  Patient presents with   Wound Check    Pt is here to check on right second toe due to wound.    HPI: 89 y.o. male presenting today for evaluation of ulcer to the distal tip of the right second toe ongoing for several months.  Past Medical History:  Diagnosis Date   Acute respiratory failure with hypoxia (HCC) 11/10/2022   ALLERGY 11/08/2007   Qualifier: Diagnosis of  By: Bartley MD, Lamar Mulch    Anemia    ANXIETY 08/07/1946   Qualifier: Diagnosis of  By: Bartley MD, Lamar Mulch    Arthritis    Chronic HFrEF (heart failure with reduced ejection fraction) (HCC)    a. 05/2017 Echo: EF 30-35%; b. 08/2017 Echo: EF 30-35%; c. 09/2022 Echo: EF 20-25%, glob HK, GrI DD, low-nl RV fxn, sev dil LA.   COPD 08/07/1988   Qualifier: Diagnosis of  By: Bartley MD, Lamar Mulch    Coronary artery disease involving native coronary artery of native heart without angina pectoris 12/06/2017   a. 05/2017 late presenting anterior STEMI/Cath: thrombotic occlusion of pLAD, dominant LCx, OM3 50%, LPDA 80%, ant & apical AK with EF 35%-->Med Rx.   Cramp in limb 10/30/2013   Depression    ECZEMA 10/14/2008   Qualifier: Diagnosis of  By: Bartley MD, Lamar Mulch    Essential hypertension 12/05/2001   Qualifier: Diagnosis of  By: Bartley MD, Lamar Mulch    GERD 10/14/2008   Qualifier: Diagnosis of  By: Bartley MD, Lamar Mulch    Hesitancy 11/19/2013   History of chickenpox    History of colon polyps    HTN (hypertension) 10/30/2013   Hyperlipidemia LDL goal <70 10/30/2013   Ischemic cardiomyopathy    a. late presenting anterior STEMI 05/29/2017; b. TTE 05/30/17: EF 30-35%, AK of the mid-apicalanteroseptal, anterior, antlat, & apical wall, Gr1DD; c. 08/2017 Echo: EF 30-35%, mid-apicalanteroseptal, ant, apical AK, Gr1 DD. d. 09/2022 Echo: EF 20-25%, glob HK, GrI DD, low-nl RV fxn, sev dil LA; e. Previously refused ICD.   Myasthenia gravis (HCC)    Need for shingles vaccine 02/16/2014    Palpitations 07/02/2020   Paroxysmal atrial fibrillation (HCC) 12/06/2017   Peyronie's disease 11/15/2006   Qualifier: Diagnosis of  By: Bartley MD, Lamar Mulch    RECTAL BLEEDING, HX OF 11/15/2006   Qualifier: Diagnosis of  By: Bartley MD, Lamar Mulch    Routine general medical examination at a health care facility 11/19/2013   Screening for prostate cancer 11/19/2013   SHOULDER PAIN, LEFT, CHRONIC 12/05/2006   Qualifier: Diagnosis of  By: Bartley MD, Lamar Mulch    Tremor 05/16/2018   Urine incontinence    VITAMIN D DEFICIENCY 04/29/2009   Qualifier: Diagnosis of  By: Bartley MD, Lamar Mulch     Past Surgical History:  Procedure Laterality Date   CARDIAC CATHETERIZATION     CHOLECYSTECTOMY     LEFT HEART CATH AND CORONARY ANGIOGRAPHY N/A 05/29/2017   Procedure: LEFT HEART CATH AND CORONARY ANGIOGRAPHY;  Surgeon: Mady Bruckner, MD;  Location: [facility];  Service: Cardiovascular;  Laterality: N/A;   TONSILLECTOMY AND ADENOIDECTOMY      Allergies  Allergen Reactions   Other Hives   Sulfonamide Derivatives     REACTION: unspecified      RT second toe postdebridement 04/11/2024  Physical Exam: General: The patient is alert and oriented x3 in no acute distress.  Dermatology: After debridement of the overlying scab and eschar to the distal  tip of the right second toe there is healthy underlying skin.  It appears very stable and should heal uneventfully  Vascular: Palpable pedal pulses bilaterally. Capillary refill within normal limits.  No appreciable edema.  No erythema.  Neurological: Grossly intact via light touch  Musculoskeletal Exam: No pedal deformities noted  Radiographic Exam RT foot 04/11/2024:  No osseous erosions or concern for underlying osteomyelitis  Assessment/Plan of Care: 1.  Ulcer distal tip of the right second toe  -Patient evaluated.  X-rays reviewed -Excisional debridement of the distal tip of the right second toe was performed  today using a tissue nipper.  Overall the toe appears healthy and viable -Recommend conservative care including triple antibiotic and a Band-Aid daily x 1 week.  Antibiotic ointment was provided for the patient -Return to clinic PRN       Thresa EMERSON Sar, DPM [facility]  Dr. Thresa EMERSON Sar, DPM    [address] [place], [place] [address]                Office [phone]  Fax [phone]

## 2024-04-18 ENCOUNTER — Ambulatory Visit: Admitting: Podiatry
